# Patient Record
Sex: Female | Born: 1945
Health system: Southern US, Community
[De-identification: ages and names within clinical notes are randomized; demographics above are authoritative.]

## PROBLEM LIST (undated history)

## (undated) DIAGNOSIS — I10 Essential (primary) hypertension: Secondary | ICD-10-CM

## (undated) DIAGNOSIS — E079 Disorder of thyroid, unspecified: Secondary | ICD-10-CM

## (undated) DIAGNOSIS — Z809 Family history of malignant neoplasm, unspecified: Secondary | ICD-10-CM

## (undated) HISTORY — PX: ABDOMINAL HYSTERECTOMY: SHX81

## (undated) HISTORY — DX: Family history of malignant neoplasm, unspecified: Z80.9

## (undated) HISTORY — PX: COLON SURGERY: SHX602

## (undated) HISTORY — PX: TUBAL LIGATION: SHX77

---

## 2001-08-24 ENCOUNTER — Ambulatory Visit (HOSPITAL_COMMUNITY): Admission: RE | Admit: 2001-08-24 | Discharge: 2001-08-24 | Payer: Self-pay | Admitting: Family Medicine

## 2001-08-24 ENCOUNTER — Encounter: Payer: Self-pay | Admitting: Family Medicine

## 2002-09-07 ENCOUNTER — Ambulatory Visit (HOSPITAL_COMMUNITY): Admission: RE | Admit: 2002-09-07 | Discharge: 2002-09-07 | Payer: Self-pay | Admitting: Family Medicine

## 2002-09-07 ENCOUNTER — Encounter: Payer: Self-pay | Admitting: Family Medicine

## 2003-09-29 ENCOUNTER — Ambulatory Visit (HOSPITAL_COMMUNITY): Admission: RE | Admit: 2003-09-29 | Discharge: 2003-09-29 | Payer: Self-pay | Admitting: Family Medicine

## 2003-10-18 ENCOUNTER — Ambulatory Visit (HOSPITAL_COMMUNITY): Admission: RE | Admit: 2003-10-18 | Discharge: 2003-10-18 | Payer: Self-pay | Admitting: Family Medicine

## 2004-01-22 ENCOUNTER — Ambulatory Visit (HOSPITAL_COMMUNITY): Admission: RE | Admit: 2004-01-22 | Discharge: 2004-01-22 | Payer: Self-pay | Admitting: General Surgery

## 2004-09-13 ENCOUNTER — Inpatient Hospital Stay (HOSPITAL_COMMUNITY): Admission: RE | Admit: 2004-09-13 | Discharge: 2004-09-19 | Payer: Self-pay | Admitting: General Surgery

## 2004-11-06 ENCOUNTER — Ambulatory Visit (HOSPITAL_COMMUNITY): Admission: RE | Admit: 2004-11-06 | Discharge: 2004-11-06 | Payer: Self-pay | Admitting: Family Medicine

## 2005-04-09 ENCOUNTER — Encounter (HOSPITAL_COMMUNITY): Admission: RE | Admit: 2005-04-09 | Discharge: 2005-04-09 | Payer: Self-pay | Admitting: Endocrinology

## 2005-04-23 ENCOUNTER — Encounter (HOSPITAL_COMMUNITY): Admission: RE | Admit: 2005-04-23 | Discharge: 2005-05-23 | Payer: Self-pay | Admitting: Endocrinology

## 2005-07-21 ENCOUNTER — Ambulatory Visit: Payer: Self-pay | Admitting: Orthopedic Surgery

## 2005-11-11 ENCOUNTER — Ambulatory Visit (HOSPITAL_COMMUNITY): Admission: RE | Admit: 2005-11-11 | Discharge: 2005-11-11 | Payer: Self-pay | Admitting: Family Medicine

## 2006-12-17 ENCOUNTER — Ambulatory Visit (HOSPITAL_COMMUNITY): Admission: RE | Admit: 2006-12-17 | Discharge: 2006-12-17 | Payer: Self-pay | Admitting: Family Medicine

## 2007-09-14 ENCOUNTER — Ambulatory Visit: Payer: Self-pay | Admitting: Gastroenterology

## 2007-12-22 ENCOUNTER — Ambulatory Visit (HOSPITAL_COMMUNITY): Admission: RE | Admit: 2007-12-22 | Discharge: 2007-12-22 | Payer: Self-pay | Admitting: Family Medicine

## 2008-11-02 ENCOUNTER — Encounter (INDEPENDENT_AMBULATORY_CARE_PROVIDER_SITE_OTHER): Payer: Self-pay | Admitting: *Deleted

## 2008-12-27 ENCOUNTER — Ambulatory Visit (HOSPITAL_COMMUNITY): Admission: RE | Admit: 2008-12-27 | Discharge: 2008-12-27 | Payer: Self-pay | Admitting: Family Medicine

## 2009-01-04 ENCOUNTER — Ambulatory Visit (HOSPITAL_COMMUNITY): Admission: RE | Admit: 2009-01-04 | Discharge: 2009-01-04 | Payer: Self-pay | Admitting: Family Medicine

## 2009-01-10 DIAGNOSIS — I10 Essential (primary) hypertension: Secondary | ICD-10-CM | POA: Insufficient documentation

## 2009-01-10 DIAGNOSIS — R42 Dizziness and giddiness: Secondary | ICD-10-CM | POA: Insufficient documentation

## 2009-01-10 DIAGNOSIS — E039 Hypothyroidism, unspecified: Secondary | ICD-10-CM | POA: Insufficient documentation

## 2009-01-11 ENCOUNTER — Ambulatory Visit: Payer: Self-pay | Admitting: Gastroenterology

## 2009-01-11 DIAGNOSIS — Z860100 Personal history of colon polyps, unspecified: Secondary | ICD-10-CM | POA: Insufficient documentation

## 2009-01-12 ENCOUNTER — Encounter: Payer: Self-pay | Admitting: Gastroenterology

## 2009-02-02 ENCOUNTER — Ambulatory Visit: Payer: Self-pay | Admitting: Gastroenterology

## 2009-02-02 ENCOUNTER — Ambulatory Visit (HOSPITAL_COMMUNITY): Admission: RE | Admit: 2009-02-02 | Discharge: 2009-02-02 | Payer: Self-pay | Admitting: Gastroenterology

## 2009-02-02 ENCOUNTER — Encounter: Payer: Self-pay | Admitting: Gastroenterology

## 2010-01-15 ENCOUNTER — Ambulatory Visit (HOSPITAL_COMMUNITY): Admission: RE | Admit: 2010-01-15 | Discharge: 2010-01-15 | Payer: Self-pay | Admitting: Family Medicine

## 2010-07-07 ENCOUNTER — Encounter: Payer: Self-pay | Admitting: Family Medicine

## 2010-10-28 IMAGING — MG MM DIGITAL SCREENING
4 series · 4 of 4 positions shown · non-contrast
Comparison: Prior studies.

DG SCREEN MAMMOGRAM BILATERAL
Bilateral CC and MLO view(s) were taken.

DIGITAL SCREENING MAMMOGRAM WITH CAD:

[L CC]
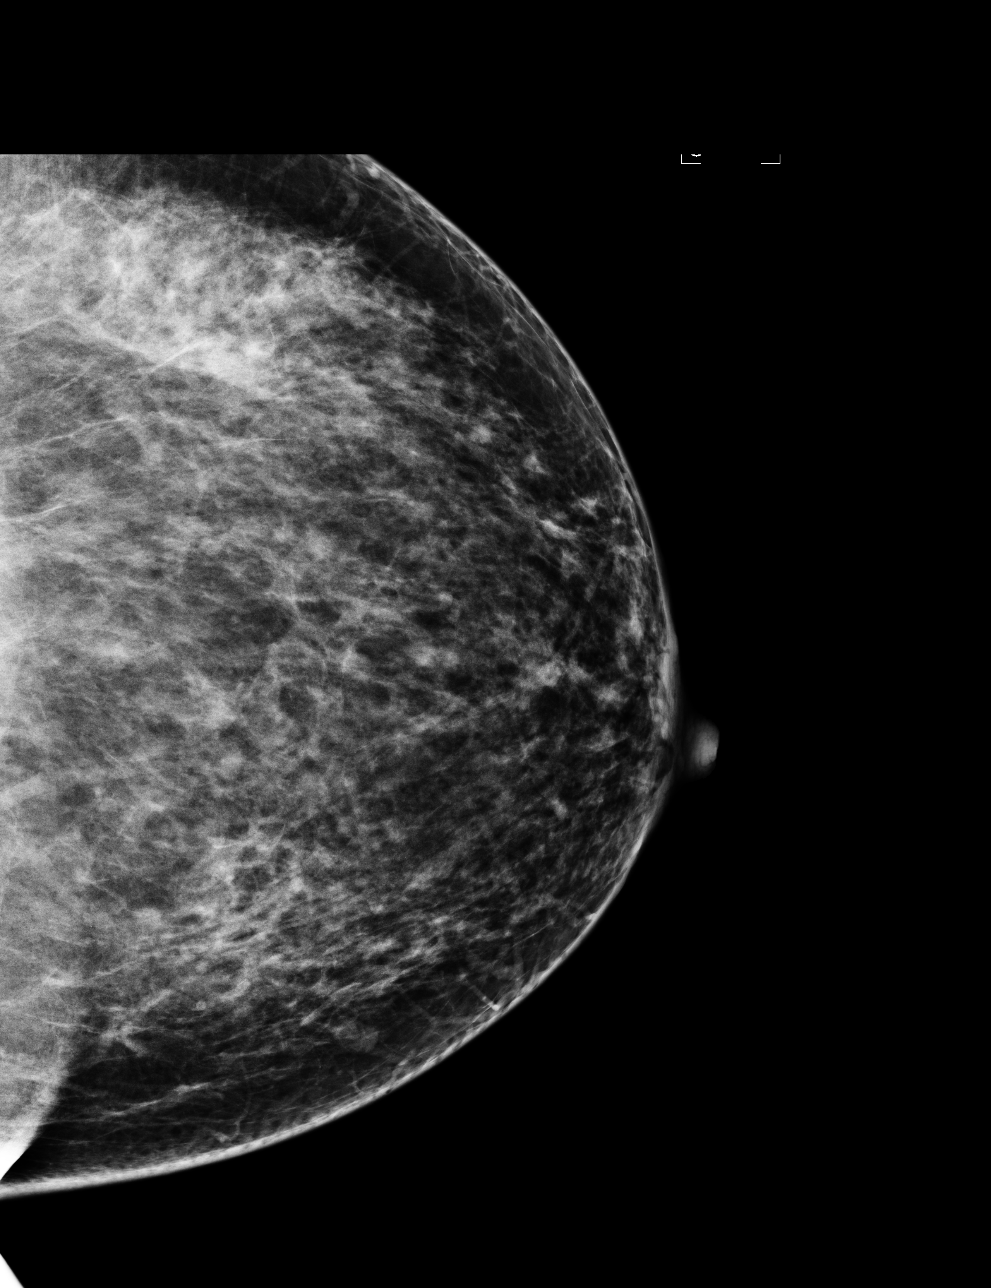

[L MLO]
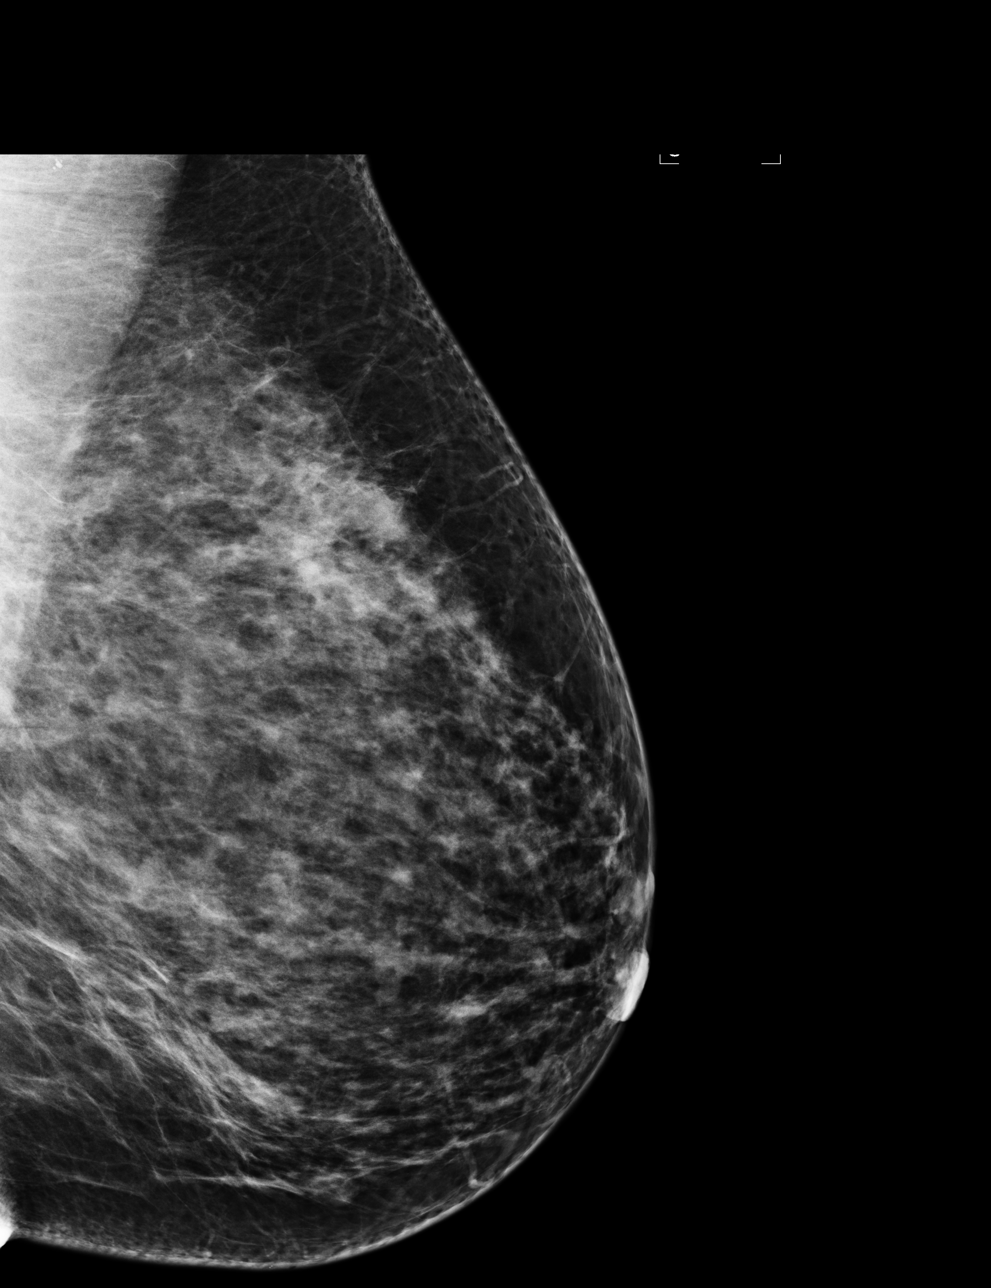

[R CC]
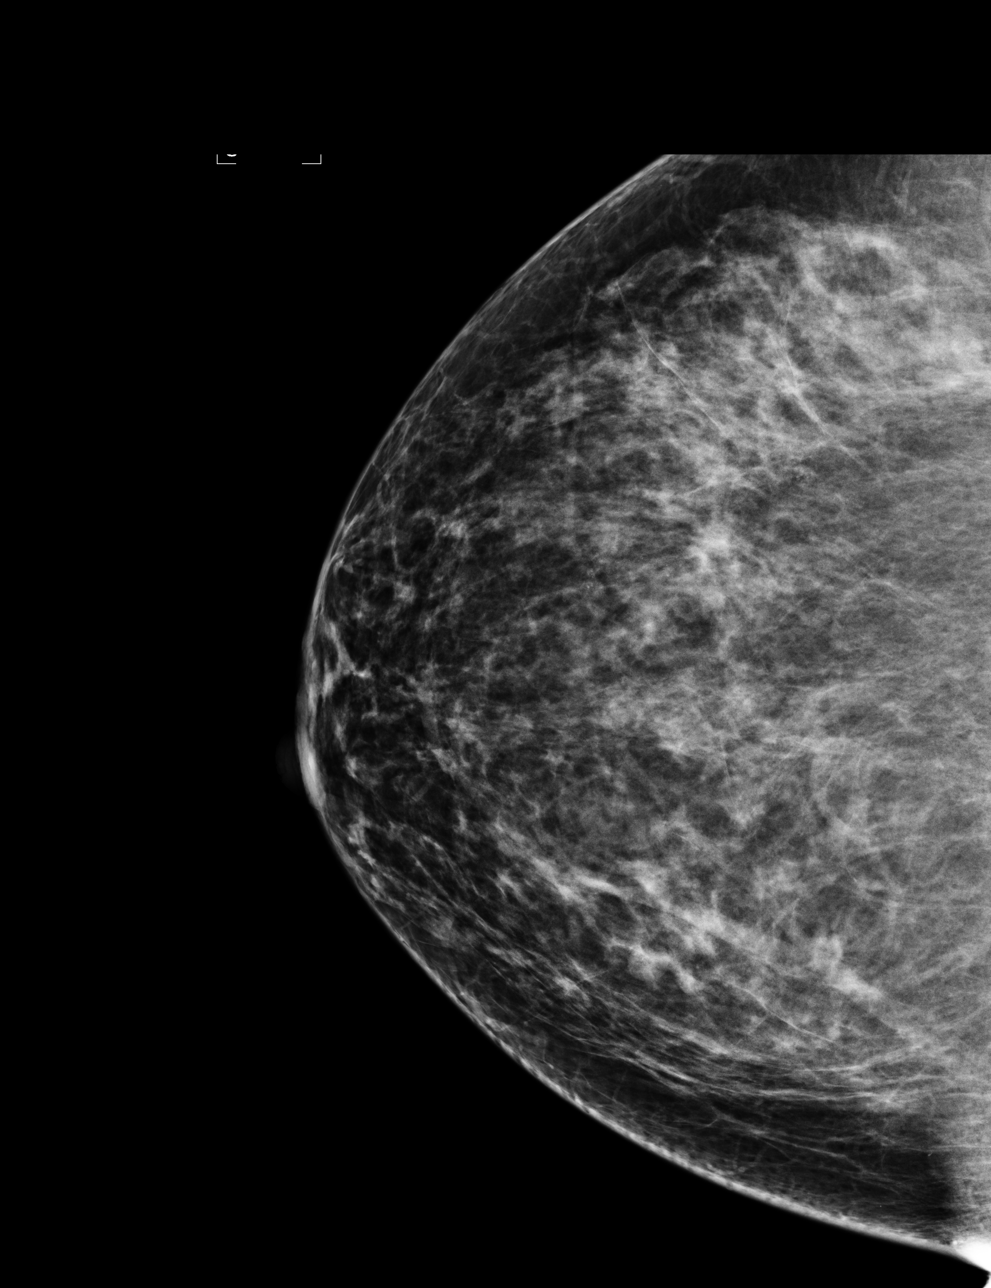

[R MLO]
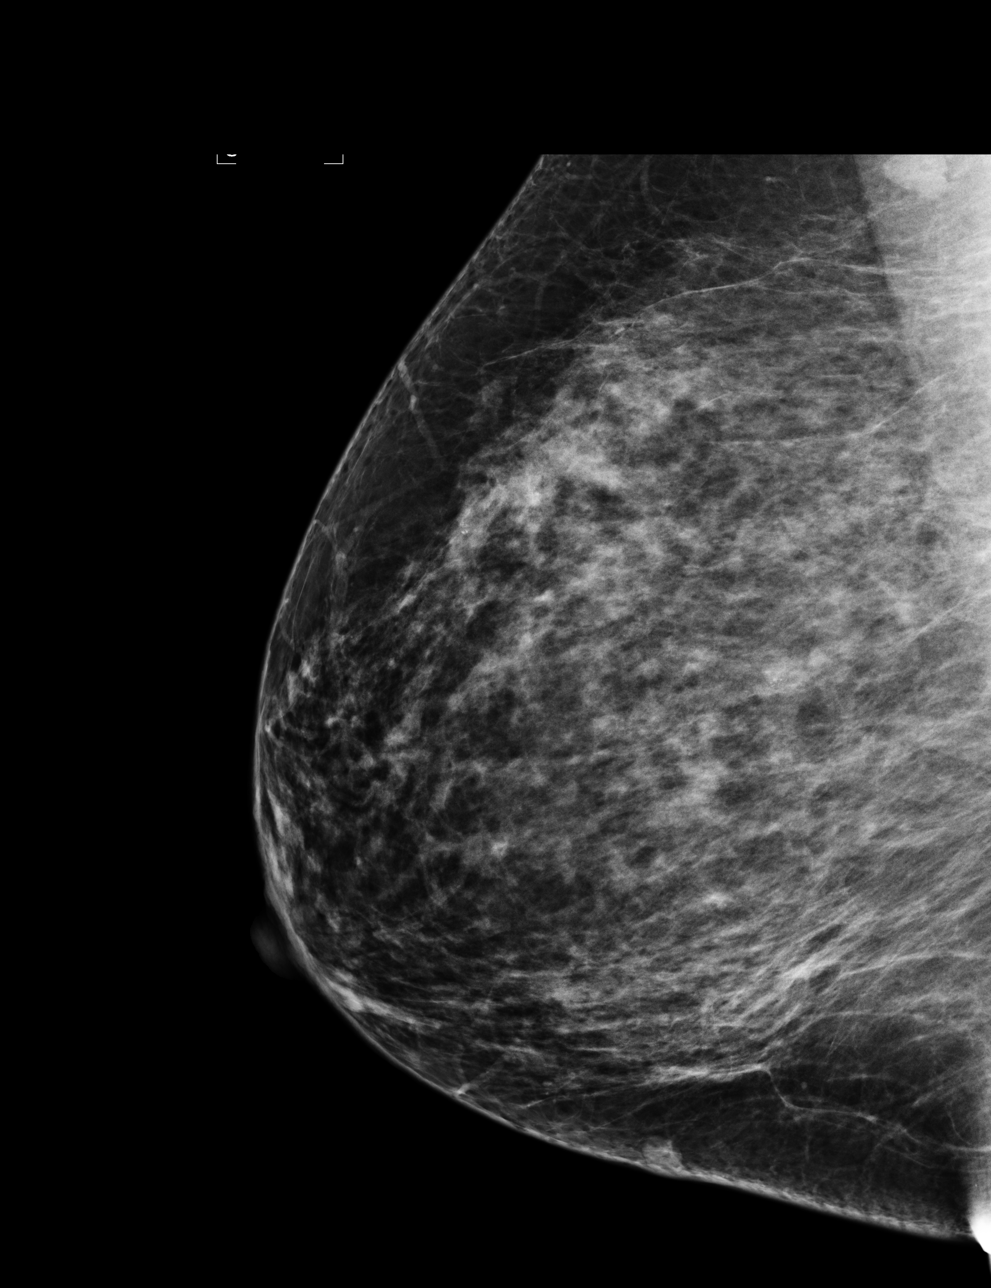

[4 of 4 positions shown; findings below may reference images not displayed]

The breast tissue is heterogeneously dense.  A possible mass is noted in the right breast.  Spot 
compression views and possibly sonography are recommended for further evaluation.  The left breast 
is unremarkable.

Images were processed with CAD.
IMPRESSION: Possible mass, right breast.  Additional evaluation is indicated.  The patient will be contacted 
for additional studies and a supplemental report will follow.

ASSESSMENT: Need additional imaging evaluation and/or prior mammograms for comparison - BI-RADS 0 -
Right

Further imaging of the right breast.
, THIS PROCEDURE WAS A DIGITAL MAMMOGRAM.

## 2010-10-29 NOTE — Op Note (Signed)
NAME:  Theresa Buchanan, Theresa Buchanan NO.:  [id number]   MEDICAL RECORD NO.:  [id number]          PATIENT TYPE:  AMB   LOCATION:  DAY                           FACILITY:  [facility]   PHYSICIAN:  Kassie Mends, M.D.      DATE OF BIRTH:  Jul 06, 1945   DATE OF PROCEDURE:  02/02/2009  DATE OF DISCHARGE:                               OPERATIVE REPORT   REFERRING PHYSICIAN:  Melony Overly, PA   PROCEDURE:  Colonoscopy with cold forceps polypectomy.   INDICATION FOR EXAM:  Theresa Buchanan is a 65 year old female who had a right  hemicolectomy due to a large adenomatous polyp with advanced features.  The surgery was performed in March 2006.  She presents for continued  surveillance.   FINDINGS:  1. Normal anastomosis.  A 6-mm sessile transverse colon polyp removed      via cold forceps.  A 1-mm sessile transverse colon polyp removed      via cold forceps.  A 2-mm sessile sigmoid colon polyp removed via      cold forceps.  2. Rare sigmoid colon diverticula.  Otherwise, no masses, inflammatory      changes, or AVMs seen.  3. Moderate internal hemorrhoids.  Otherwise, normal retroflexed view      of the rectum.   RECOMMENDATIONS:  1. Screening colonoscopy in 5 years with an ENTRADA overtube.  2. Will call her with the results of her biopsies.  She may restart      her aspirin on February 14, 2009.  3. She should follow a high-fiber diet.  She was given a handout on      high-fiber diet, polyps,  diverticulosis, and hemorrhoids.   MEDICATIONS:  1. Demerol 75 mg IV.  2. Versed 4 mg IV.   PROCEDURE TECHNIQUE:  Physical exam was performed and informed consent  was obtained from the patient after explaining the benefits, risks, and  alternatives to the procedure.  The patient was connected to the monitor  and placed in the left lateral position.  Continuous oxygen was provided  by nasal cannula and IV medicine administered through an indwelling  cannula.  After administration of sedation and  rectal exam, the  patient's rectum was intubated and the scope was advanced under direct  visualization to the anastomosis.  The scope was removed slowly by  carefully examining the color, texture, anatomy, and integrity of the  mucosa on the way out.  The patient was recovered in endoscopy and  discharged home in satisfactory condition.   PATH:  SIMPLE ADENOMAS. TCS IN 5 YEARS. HIGH FIBER DIET. FIRST DEGREE  RELATIVES NEEDS TSC AT AGE 52 ANF THEN EVERY 5 YEARS.      Kassie Mends, M.D.  Electronically Signed     SM/MEDQ  D:  02/02/2009  T:  02/02/2009  Job:  [id number]   cc:   Melony Overly, PA   [name], M.D.  Fax: [phone]

## 2010-10-29 NOTE — Assessment & Plan Note (Signed)
NAME:  Theresa Buchanan, MONTECALVO                CHART#:  [id number]   DATE:  09/14/2007                       DOB:  1945-10-11   REASON FOR CONSULTATION:  History of colon polyps status post partial  colectomy; when does next colonoscopy need to be?   PHYSICIAN REQUESTING CONSULTATION:  [name], M.D.   HISTORY OF PRESENT ILLNESS:  The patient is a 65 year old African  American who presents today at the request of Melony Overly, [facility] with Dr.  [name] for further recommendation regarding timing of her next  colonoscopy. The patient has a history of colonic polyps which were  reportedly large and could not be removed endoscopically. She therefore  underwent a partial colectomy. This was around 2006. She states that the  polyps did not have any cancer within them. Her initial colonoscopy was  by Dr. Lovell Sheehan. Her partial colectomy was by Dr. Elpidio Anis. Her mother  has a history of colon polyps as well. No family history of colorectal  cancer. None of her children have had colonoscopies. Her sisters have  all had colonoscopies without polyps. She denies any constipation,  melena, rectal bleeding, diarrhea, abdominal pain. She does have  occasional post-prandial stools related to certain foods. This usually  happens after breakfast. She denies any heartburn, nausea, vomiting,  dysphagia, odynophagia, or weight loss.   CURRENT MEDICATIONS:  1. Synthroid 75 mcg daily.  2. Enalapril 20 mg daily.  3. HCTZ 25 mg daily.  4. Aspirin 81 mg daily.  5. Meclizine p.r.n.   ALLERGIES:  No known drug allergies.   PAST MEDICAL HISTORY:  1. Hypertension.  2. Hypothyroidism.  3. She has been having some vertigo-like symptoms.   PAST SURGICAL HISTORY:  1. Partial colectomy as outlined above for polyps.  2. Hysterectomy.  3. Tubal ligation.   FAMILY HISTORY:  As above. In addition, she had an aunt with breast  cancer and an aunt with pancreatic cancer. Her brother died due to an  aneurysm.   SOCIAL HISTORY:  She is married. She has 63 year old, 63 year old, and  78 year old children. She is employed with [employer] and never been a  smoker. No alcohol use.   REVIEW OF SYSTEMS:  See HPI for GI and constitutional. Cardiopulmonary:  Denies chest pain and shortness of breath and cough. Genitourinary:  Denies dysuria or hematuria.   PHYSICAL EXAMINATION:  VITAL SIGNS:  Weight 179. Height 5 feet 6 inches.  Temperature 98.2. Blood pressure 118/70. Pulse 64.  GENERAL:  Pleasant, well-developed, well-nourished, obese black female  in no acute distress. SKIN:  Warm and dry. No jaundice.HEENT:  Sclerae  anicteric. Oropharyngeal mucosa moist and pink. No lesions, erythema, or  exudate. No lymphadenopathy or thyromegaly. CHEST:  Lungs are clear to  auscultation.  CARDIAC:  Regular rate and rhythm. Normal S1, S2. No murmurs, gallops,  or rubs.ABDOMEN:  Positive bowel sounds. Abdomen is soft, nontender,  nondistended. No organomegaly or masses. No rebound or guarding. No  abdominal bruits or hernias.LOWER EXTREMITIES:  No edema.   IMPRESSION:  Theresa Buchanan is a 65 year old lady with history of partial  colectomy due to large colon polyps approximately 3 years ago. Based on  what she is telling me, she will likely need to have a colonoscopy in  the near future. Would like to obtain copies of her colonoscopy and op  notes as well as pathology to make more definitive recommendations  regarding herself and her family.   PLAN:  1. Retrieve medical records as above.  2. Further recommendations to follow.   I would like to thank Melony Overly, [facility] and Dr. [name] for  allowing [place] to take part in the care of this patient.   ADDENDUM:  TCS in 2011 since polyp had no evidence of high grade  dysplasia per Dr. Cira Servant.       Theresa Buchanan, P.A.  Electronically Signed     Theresa Buchanan, M.D.  Electronically Signed   LL/MEDQ  D:  09/14/2007  T:  09/14/2007  Job:  [id number]   cc:   Melony Overly, PA  [name], M.D.

## 2010-11-01 NOTE — Letter (Signed)
Oct 20, 2007    Theresa Buchanan   Dear Ms. Moravek,   I am writing this letter to provide you with further recommendations  regarding when you need your next colonoscopy.  As you recall, you were  seen in the office on 09/14/07 for consultation regarding a history of  colon polyps and to determine when you needed your next colonoscopy.  We  received records from your prior surgeries.  As you may recall, you  underwent removal of a tumor from your right colon on 09/13/04 by Dr.  Elpidio Anis.  The pathology from that surgery revealed no malignancy.  You had a tubular adenoma, which is a benign lesion that could lead to  cancer.  Given that there was no high-grade dysplasia or malignancy  present, Dr. Cira Servant has recommended that your follow-up colonoscopy be in  five years from your prior colonoscopy in August, 2005; therefore, you  need a colonoscopy in August, 2010.   If you have any questions, please feel free to contact me at [phone].  Please note I am sending a copy of this letter to your family  physician for their records as well.     Tana Coast, P.A.  Electronically Signed     Kassie Mends, M.D.  Electronically Signed    LL/MEDQ  D:  10/20/2007  T:  10/20/2007  Job:  [id number]   cc:   [name], M.D.

## 2010-11-01 NOTE — H&P (Signed)
NAME:  Theresa Buchanan, Bloodgood               ACCOUNT NO.:  [id number]   MEDICAL RECORD NO.:  [id number]          PATIENT TYPE:  AMB   LOCATION:  DAY                           FACILITY:  [facility]   PHYSICIAN:  Leroy C. Katrinka Blazing, M.D.   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  The patient was scheduled for repeat colonoscopy and possible  right colectomy in September 2005 but she became afraid and did not show up  for the procedure.  She denies having any rectal bleeding since then.  She  does have mild right-sided abdominal pain.  She has not had nausea or  vomiting.  Review of the pathology from August 2005 shows adenomatous polyp  with nicks that were tubular architecture.  The patient is scheduled for  right colectomy.   PAST MEDICAL HISTORY:  1.  She has hypertension.  2.  Gastroesophageal reflux disease.  3.  Depression.  4.  Osteoarthritis.   ALLERGIES:  No known drug allergies.   MEDICATIONS:  1.  Enalapril HCT 20/12.5 daily.  2.  Protonix 40 mg daily.   PAST SURGICAL HISTORY:  1.  Total abdominal hysterectomy.  2.  Tubal ligation.   FAMILY HISTORY:  Positive for hypertension, arteriosclerotic heart disease,  diabetes mellitus, stroke, and DVT.   PHYSICAL EXAMINATION:  VITAL SIGNS:  Blood pressure 160/90, pulse 76,  respirations 20, weight 169 pounds.  HEENT:  Unremarkable.  NECK:  Supple.  No JVD, bruits, adenopathy, or thyromegaly.  CHEST:  Clear to auscultation.  HEART:  Regular rate and rhythm without murmur, gallop or rub.  ABDOMEN:  Soft, nontender.  No masses.  EXTREMITIES:  No cyanosis, clubbing or edema.  NEUROLOGIC:  No focal motor, sensory or cerebellar deficits.   IMPRESSION:  1.  History of villous tumor of the right colon with  delayed treatment due      to patient noncompliance.  2.  History of transverse colon polyps.  3.  Hypertension.  4.  Gastroesophageal reflux disease.  5.  Osteoarthritis.   PLAN:  The patient will have bowel prep at home and will undergo right  colectomy.      LCS/MEDQ  D:  09/12/2004  T:  09/13/2004  Job:  [id number]   cc:   [name], M.D.  [address]  [place]  [place] [address]  Fax: [phone]

## 2010-11-01 NOTE — Discharge Summary (Signed)
NAME:  Theresa Buchanan, Theresa Buchanan               ACCOUNT NO.:  [id number]   MEDICAL RECORD NO.:  [id number]          PATIENT TYPE:  INP   LOCATION:  A331                          FACILITY:  [facility]   PHYSICIAN:  Jerolyn Shin C. Katrinka Blazing, M.D.   DATE OF BIRTH:  02-06-1946   DATE OF ADMISSION:  09/13/2004  DATE OF DISCHARGE:  04/06/2006LH                                 DISCHARGE SUMMARY   DISCHARGE DIAGNOSES:  1.  Tubular adenoma of cecum.  2.  Hypertension.  3.  Gastroesophageal reflux disease.  4.  Osteoarthritis.   SPECIAL PROCEDURE:  Right colectomy.   DISPOSITION:  Patient discharged home in stable satisfactory condition.   DISCHARGE MEDICATIONS:  1.  Tylox one or two every 4 hours as needed for pain.  2.  Enalapril/hydrochlorothiazide 20/12.5 daily.  3.  Reglan 10 mg before every meal and at bedtime.   FOLLOWUP:  Patient is scheduled to be seen in the office in 2 weeks.   SUMMARY:  [age]-year-old female with history of a large villous tumor of  the right colon which was initially noted in August 2005.  The patient was  scheduled for repeat colonoscopy and right colectomy in September but she  became afraid and did not show up for the procedure.  She denied having any  rectal bleeding in the interim.  She did have some mild right-sided  abdominal pain.  She has not had nausea or vomiting.  Pathology from the  biopsy in August 2005 showed an adenomatous polyp with tubular architecture.  The patient was scheduled for right colectomy.  She had no significant major  illness.  Physical exam was unremarkable.  The patient was admitted through  day surgery and underwent the right colectomy.  She remained stable in the  postoperative course except for transient hypoxemia due to atelectasis.  As  her activity increased, her hypoxemia improved.  By the fifth postoperative  day she was having regular bowel movements.  Her diet was advanced and she  was discharged on the sixth postoperative day in  satisfactory condition.      LCS/MEDQ  D:  10/26/2004  T:  10/27/2004  Job:  [id number]

## 2010-11-01 NOTE — Op Note (Signed)
NAME:  Theresa Buchanan, Theresa Buchanan               ACCOUNT NO.:  [id number]   MEDICAL RECORD NO.:  [id number]          PATIENT TYPE:  AMB   LOCATION:  DAY                           FACILITY:  [facility]   PHYSICIAN:  Leroy C. Katrinka Blazing, M.D.   DATE OF BIRTH:  03-20-1946   DATE OF PROCEDURE:  09/13/2004  DATE OF DISCHARGE:                                 OPERATIVE REPORT   PREOPERATIVE DIAGNOSIS:  Right colon mass with villous tumor.   POSTOPERATIVE DIAGNOSIS:  Villous tumor, right colon.   PROCEDURE:  Right colectomy.   SURGEON:  Elpidio Anis, M.D.   DESCRIPTION:  Under general anesthesia, the patient's abdomen was prepped  and draped in a sterile field.  A midline incision was made. Upon entering  the abdomen, it was noted that there was a small soft mass in the cecum  opposite the ileocecal valve.  Initially this was felt to be polypoid in  nature, so a small colotomy was made.  Inspection of this mass revealed it  to be broad-based with some infiltration of the mucosa with a small area of  central ulceration.  It was therefore felt that this needed to be excised  rather than a local resection.  The colotomy was closed with the TA-30  stapler.  The right colon was mobilized up to the level of the ascending  colon.  The patient had a very long appendix, which was retrocecal and  extended up to the subhepatic space.  This was fully mobilized.  The  terminal ileum was mobilized without difficulty.  The terminal ileum was  transected using a GIA-60 stapler.  The colon was divided near the hepatic  flexure with a GIA-60 stapler.  The mesentery was then serially dissected,  clamped with Kelly clamps and divided.  Vessels were tied with double  ligatures of 2-0 silk.  The specimen was passed off.  A side-to-side  anastomosis was achieved.  The side of the terminal ileum was sutured to the  colonic segment along the antimesenteric borders.  This was done using GIA-  60 and a TA-60 stapler.  The anastomosis was  felt to be widely patent.  The  mesenteric defect was closed with 3-0 Vicryl.  Irrigation was carried out.  The patient tolerated procedure well.  Sponge, needle, instrument, and blade  counts were verified as correct.  Inspection of the liver, pancreas,  gallbladder, stomach was normal.  Small bowel appeared to be normal.  The  remainder of the colon appeared to be normal.  She had two normal-appearing  ovaries.  The abdomen was closed after verifying the sponge, needle,  instrument and blade counts were correct.  Fascia was closed with #1  Prolene.  Subcutaneous tissue was closed with 2-0 and 3-0 Vicryl.  Skin was closed  with staples.  Dressing was placed.  The patient tolerated procedure well.  She was awakened from anesthesia uneventfully, transferred to a bed and  taken to the postanesthetic care unit for further monitoring. copy to Dr.  Thayer Ohm in place      LCS/MEDQ  D:  09/13/2004  T:  09/13/2004  Job:  [id number]   cc:   [name], M.D.  [address], [address]  [place]  [place] [address]  Fax: [phone]

## 2011-02-03 ENCOUNTER — Other Ambulatory Visit (HOSPITAL_BASED_OUTPATIENT_CLINIC_OR_DEPARTMENT_OTHER): Payer: Self-pay | Admitting: Family Medicine

## 2011-02-03 DIAGNOSIS — Z139 Encounter for screening, unspecified: Secondary | ICD-10-CM

## 2011-02-06 ENCOUNTER — Ambulatory Visit (HOSPITAL_COMMUNITY)
Admission: RE | Admit: 2011-02-06 | Discharge: 2011-02-06 | Disposition: A | Discharge status: Home / Self Care | Payer: BC Managed Care – PPO | Source: Ambulatory Visit | Attending: Family Medicine | Admitting: Family Medicine

## 2011-02-06 DIAGNOSIS — Z1231 Encounter for screening mammogram for malignant neoplasm of breast: Secondary | ICD-10-CM | POA: Insufficient documentation

## 2011-02-06 DIAGNOSIS — Z139 Encounter for screening, unspecified: Secondary | ICD-10-CM

## 2011-02-13 ENCOUNTER — Other Ambulatory Visit: Payer: Self-pay | Admitting: Family Medicine

## 2011-02-13 DIAGNOSIS — R928 Other abnormal and inconclusive findings on diagnostic imaging of breast: Secondary | ICD-10-CM

## 2011-03-12 ENCOUNTER — Ambulatory Visit (HOSPITAL_COMMUNITY)
Admission: RE | Admit: 2011-03-12 | Discharge: 2011-03-12 | Disposition: A | Discharge status: Home / Self Care | Payer: BC Managed Care – PPO | Source: Ambulatory Visit | Attending: Family Medicine | Admitting: Family Medicine

## 2011-03-12 ENCOUNTER — Other Ambulatory Visit (HOSPITAL_COMMUNITY): Payer: Self-pay | Admitting: Family Medicine

## 2011-03-12 DIAGNOSIS — R928 Other abnormal and inconclusive findings on diagnostic imaging of breast: Secondary | ICD-10-CM

## 2012-02-04 ENCOUNTER — Ambulatory Visit (HOSPITAL_COMMUNITY)
Admission: RE | Admit: 2012-02-04 | Discharge: 2012-02-04 | Disposition: A | Discharge status: Home / Self Care | Payer: BC Managed Care – PPO | Source: Ambulatory Visit | Attending: Family Medicine | Admitting: Family Medicine

## 2012-02-04 ENCOUNTER — Other Ambulatory Visit (HOSPITAL_COMMUNITY): Payer: Self-pay | Admitting: Family Medicine

## 2012-02-04 DIAGNOSIS — Z01419 Encounter for gynecological examination (general) (routine) without abnormal findings: Secondary | ICD-10-CM

## 2012-02-04 DIAGNOSIS — M542 Cervicalgia: Secondary | ICD-10-CM | POA: Insufficient documentation

## 2012-02-04 DIAGNOSIS — Z139 Encounter for screening, unspecified: Secondary | ICD-10-CM

## 2012-02-04 DIAGNOSIS — M130 Polyarthritis, unspecified: Secondary | ICD-10-CM

## 2012-02-04 DIAGNOSIS — M25519 Pain in unspecified shoulder: Secondary | ICD-10-CM | POA: Insufficient documentation

## 2012-03-12 ENCOUNTER — Ambulatory Visit (HOSPITAL_COMMUNITY)
Admission: RE | Admit: 2012-03-12 | Discharge: 2012-03-12 | Disposition: A | Discharge status: Home / Self Care | Payer: BC Managed Care – PPO | Source: Ambulatory Visit | Attending: Family Medicine | Admitting: Family Medicine

## 2012-03-12 DIAGNOSIS — Z1231 Encounter for screening mammogram for malignant neoplasm of breast: Secondary | ICD-10-CM | POA: Insufficient documentation

## 2012-03-12 DIAGNOSIS — Z01419 Encounter for gynecological examination (general) (routine) without abnormal findings: Secondary | ICD-10-CM

## 2012-03-12 DIAGNOSIS — Z139 Encounter for screening, unspecified: Secondary | ICD-10-CM

## 2012-03-12 DIAGNOSIS — M899 Disorder of bone, unspecified: Secondary | ICD-10-CM | POA: Insufficient documentation

## 2013-02-21 ENCOUNTER — Other Ambulatory Visit (HOSPITAL_COMMUNITY): Payer: Self-pay | Admitting: Family Medicine

## 2013-02-21 DIAGNOSIS — Z139 Encounter for screening, unspecified: Secondary | ICD-10-CM

## 2013-03-14 ENCOUNTER — Ambulatory Visit (HOSPITAL_COMMUNITY)
Admission: RE | Admit: 2013-03-14 | Discharge: 2013-03-14 | Disposition: A | Discharge status: Home / Self Care | Payer: Medicare Other | Source: Ambulatory Visit | Attending: Family Medicine | Admitting: Family Medicine

## 2013-03-14 DIAGNOSIS — Z139 Encounter for screening, unspecified: Secondary | ICD-10-CM

## 2013-03-14 DIAGNOSIS — Z1231 Encounter for screening mammogram for malignant neoplasm of breast: Secondary | ICD-10-CM | POA: Insufficient documentation

## 2014-01-20 ENCOUNTER — Encounter: Payer: Self-pay | Admitting: Gastroenterology

## 2014-02-07 ENCOUNTER — Other Ambulatory Visit (HOSPITAL_COMMUNITY): Payer: Self-pay | Admitting: Family Medicine

## 2014-02-07 DIAGNOSIS — Z1231 Encounter for screening mammogram for malignant neoplasm of breast: Secondary | ICD-10-CM

## 2014-03-15 ENCOUNTER — Ambulatory Visit (HOSPITAL_COMMUNITY): Payer: BC Managed Care – PPO

## 2014-03-15 ENCOUNTER — Ambulatory Visit (HOSPITAL_COMMUNITY)
Admission: RE | Admit: 2014-03-15 | Discharge: 2014-03-15 | Disposition: A | Discharge status: Home / Self Care | Payer: Medicare Other | Source: Ambulatory Visit | Attending: Family Medicine | Admitting: Family Medicine

## 2014-03-15 DIAGNOSIS — Z1231 Encounter for screening mammogram for malignant neoplasm of breast: Secondary | ICD-10-CM | POA: Insufficient documentation

## 2014-06-14 ENCOUNTER — Other Ambulatory Visit (HOSPITAL_COMMUNITY): Payer: Self-pay | Admitting: Family Medicine

## 2014-06-14 ENCOUNTER — Ambulatory Visit (HOSPITAL_COMMUNITY)
Admission: RE | Admit: 2014-06-14 | Discharge: 2014-06-14 | Disposition: A | Discharge status: Home / Self Care | Payer: Medicare Other | Source: Ambulatory Visit | Attending: Family Medicine | Admitting: Family Medicine

## 2014-06-14 DIAGNOSIS — M542 Cervicalgia: Secondary | ICD-10-CM | POA: Diagnosis present

## 2014-06-14 DIAGNOSIS — M5032 Other cervical disc degeneration, mid-cervical region: Secondary | ICD-10-CM | POA: Diagnosis not present

## 2014-11-23 DIAGNOSIS — Z0001 Encounter for general adult medical examination with abnormal findings: Secondary | ICD-10-CM | POA: Diagnosis not present

## 2014-11-23 DIAGNOSIS — Z86010 Personal history of colonic polyps: Secondary | ICD-10-CM | POA: Diagnosis not present

## 2014-11-23 DIAGNOSIS — Z6830 Body mass index (BMI) 30.0-30.9, adult: Secondary | ICD-10-CM | POA: Diagnosis not present

## 2014-11-23 DIAGNOSIS — E782 Mixed hyperlipidemia: Secondary | ICD-10-CM | POA: Diagnosis not present

## 2014-11-23 DIAGNOSIS — E6609 Other obesity due to excess calories: Secondary | ICD-10-CM | POA: Diagnosis not present

## 2014-11-23 DIAGNOSIS — I10 Essential (primary) hypertension: Secondary | ICD-10-CM | POA: Diagnosis not present

## 2014-12-01 ENCOUNTER — Telehealth: Payer: Self-pay | Admitting: Gastroenterology

## 2014-12-01 NOTE — Telephone Encounter (Signed)
TRIAGE PT FOR Screening colonoscopy in 5 years with an ENTRADA overtube, Dx: PMHx: ADVANCED ADENOMA 2006, SIMPLE ADENOMAS(2010) SUPREP-FULL LIQUIDS WITH BREAKFAST.   Full Liquid Diet A high-calorie, high-protein supplement should be used to meet your nutritional requirements when the full liquid diet is continued for more than 2 or 3 days. If this diet is to be used for an extended period of time (more than 7 days), a multivitamin should be considered.  Breads and Starches  Allowed: None are allowed except crackers WHOLE OR pureed (made into a thick, smooth soup) in soup.   Avoid: Any others.    Potatoes/Pasta/Rice  Allowed: ANY ITEM AS A SOUP OR SMALL PLATE OF MASHED POTATOES.       Vegetables  Allowed: Strained tomato or vegetable juice. Vegetables pureed in soup.   Avoid: Any others.    Fruit  Allowed: Any strained fruit juices and fruit drinks. Include 1 serving of citrus or vitamin C-enriched fruit juice daily.   Avoid: Any others.  Meat and Meat Substitutes  Allowed: Egg  Avoid: Any meat, fish, or fowl. All cheese.  Milk  Allowed: Milk beverages, including milk shakes and instant breakfast mixes. Smooth yogurt.   Avoid: Any others. Avoid dairy products if not tolerated.    Soups and Combination Foods  Allowed: Broth, strained cream soups. Strained, broth-based soups.   Avoid: Any others.    Desserts and Sweets  Allowed: flavored gelatin,plain ice cream, sherbet, smooth pudding, junket, fruit ices, frozen ice pops, pudding pops,, frozen fudge pops, chocolate syrup. Sugar, honey, jelly, syrup.   Avoid: Any others.  Fats and Oils  Allowed: Margarine, butter, cream, sour cream, oils.   Avoid: Any others.  Beverages  Allowed: All.   Avoid: None.  Condiments  Allowed: Iodized salt, pepper, spices, flavorings. Cocoa powder.   Avoid: Any others.    SAMPLE MEAL PLAN Breakfast   cup orange juice.   1 OR 2 EGGS   1 cup  milk.   1 cup  beverage (coffee or tea).   Cream or sugar, if desired.    Midmorning Snack  2 SCRAMBLED OR HARD BOILED EGG   Lunch  1 cup cream soup.    cup fruit juice.   1 cup milk.    cup custard.   1 cup beverage (coffee or tea).   Cream or sugar, if desired.    Midafternoon Snack  1 cup milk shake.  Dinner  1 cup cream soup.    cup fruit juice.   1 cup milk.    cup pudding.   1 cup beverage (coffee or tea).   Cream or sugar, if desired.  Evening Snack  1 cup supplement.  To increase calories, add sugar, cream, butter, or margarine if possible. Nutritional supplements will also increase the total calories.

## 2014-12-04 NOTE — Telephone Encounter (Signed)
Noted. Pt on recall list for  5 year

## 2014-12-12 DIAGNOSIS — M2560 Stiffness of unspecified joint, not elsewhere classified: Secondary | ICD-10-CM | POA: Diagnosis not present

## 2014-12-12 DIAGNOSIS — M542 Cervicalgia: Secondary | ICD-10-CM | POA: Diagnosis not present

## 2014-12-12 DIAGNOSIS — M62412 Contracture of muscle, left shoulder: Secondary | ICD-10-CM | POA: Diagnosis not present

## 2014-12-12 DIAGNOSIS — R293 Abnormal posture: Secondary | ICD-10-CM | POA: Diagnosis not present

## 2014-12-21 NOTE — Telephone Encounter (Signed)
PT NEEDS TRIAGE FOR TCS IN 2016 & THEN EVERY 5 YEARS.

## 2014-12-29 NOTE — Telephone Encounter (Signed)
LMOM to call.

## 2015-01-03 NOTE — Telephone Encounter (Signed)
Letter mailed to pt to call and schedule colonoscopy.

## 2015-02-27 ENCOUNTER — Other Ambulatory Visit (HOSPITAL_COMMUNITY): Payer: Self-pay | Admitting: Family Medicine

## 2015-02-27 DIAGNOSIS — Z1231 Encounter for screening mammogram for malignant neoplasm of breast: Secondary | ICD-10-CM

## 2015-03-22 ENCOUNTER — Ambulatory Visit (HOSPITAL_COMMUNITY)
Admission: RE | Admit: 2015-03-22 | Discharge: 2015-03-22 | Disposition: A | Discharge status: Home / Self Care | Payer: Medicare Other | Source: Ambulatory Visit | Attending: Family Medicine | Admitting: Family Medicine

## 2015-03-22 DIAGNOSIS — Z1231 Encounter for screening mammogram for malignant neoplasm of breast: Secondary | ICD-10-CM

## 2015-03-23 ENCOUNTER — Other Ambulatory Visit: Payer: Self-pay | Admitting: Family Medicine

## 2015-03-23 DIAGNOSIS — R928 Other abnormal and inconclusive findings on diagnostic imaging of breast: Secondary | ICD-10-CM

## 2015-04-03 ENCOUNTER — Ambulatory Visit (HOSPITAL_COMMUNITY)
Admission: RE | Admit: 2015-04-03 | Discharge: 2015-04-03 | Disposition: A | Discharge status: Home / Self Care | Payer: Medicare Other | Source: Ambulatory Visit | Attending: Family Medicine | Admitting: Family Medicine

## 2015-04-03 DIAGNOSIS — R928 Other abnormal and inconclusive findings on diagnostic imaging of breast: Secondary | ICD-10-CM | POA: Diagnosis not present

## 2015-10-17 DIAGNOSIS — H52223 Regular astigmatism, bilateral: Secondary | ICD-10-CM | POA: Diagnosis not present

## 2015-10-17 DIAGNOSIS — H5203 Hypermetropia, bilateral: Secondary | ICD-10-CM | POA: Diagnosis not present

## 2015-10-17 DIAGNOSIS — H524 Presbyopia: Secondary | ICD-10-CM | POA: Diagnosis not present

## 2015-10-17 DIAGNOSIS — H25013 Cortical age-related cataract, bilateral: Secondary | ICD-10-CM | POA: Diagnosis not present

## 2015-11-15 DIAGNOSIS — H8110 Benign paroxysmal vertigo, unspecified ear: Secondary | ICD-10-CM | POA: Diagnosis not present

## 2015-11-15 DIAGNOSIS — Z1389 Encounter for screening for other disorder: Secondary | ICD-10-CM | POA: Diagnosis not present

## 2015-11-15 DIAGNOSIS — L293 Anogenital pruritus, unspecified: Secondary | ICD-10-CM | POA: Diagnosis not present

## 2016-01-02 ENCOUNTER — Emergency Department (HOSPITAL_COMMUNITY)
Admission: EM | Admit: 2016-01-02 | Discharge: 2016-01-03 | Disposition: A | Discharge status: Home / Self Care | Payer: Medicare Other | Attending: Emergency Medicine | Admitting: Emergency Medicine

## 2016-01-02 ENCOUNTER — Encounter (HOSPITAL_COMMUNITY): Payer: Self-pay | Admitting: Emergency Medicine

## 2016-01-02 DIAGNOSIS — I10 Essential (primary) hypertension: Secondary | ICD-10-CM | POA: Insufficient documentation

## 2016-01-02 DIAGNOSIS — Y929 Unspecified place or not applicable: Secondary | ICD-10-CM | POA: Diagnosis not present

## 2016-01-02 DIAGNOSIS — Y999 Unspecified external cause status: Secondary | ICD-10-CM | POA: Insufficient documentation

## 2016-01-02 DIAGNOSIS — X58XXXA Exposure to other specified factors, initial encounter: Secondary | ICD-10-CM | POA: Insufficient documentation

## 2016-01-02 DIAGNOSIS — T161XXA Foreign body in right ear, initial encounter: Secondary | ICD-10-CM | POA: Insufficient documentation

## 2016-01-02 DIAGNOSIS — Y939 Activity, unspecified: Secondary | ICD-10-CM | POA: Diagnosis not present

## 2016-01-02 HISTORY — DX: Disorder of thyroid, unspecified: E07.9

## 2016-01-02 HISTORY — DX: Essential (primary) hypertension: I10

## 2016-01-02 NOTE — ED Notes (Signed)
Pt states she has a bug in her R. Ear.

## 2016-01-03 NOTE — Discharge Instructions (Signed)
Ear Foreign Body °An ear foreign body is an object that is stuck in your ear. The object is usually stuck in the ear canal. °CAUSES °In all ages of people, the most common foreign bodies are insects that enter the ear canal. It is common for young children to put objects into the ear canal. These may include pebbles, beads, parts of toys, and any other small objects that fit into the ear. In adults, objects such as cotton swabs may become lodged in the ear canal.  °SIGNS AND SYMPTOMS °A foreign body in the ear may cause: °· Pain. °· Buzzing or roaring sounds. °· Hearing loss. °· Ear drainage or bleeding. °· Nausea and vomiting. °· A feeling that your ear is full. °DIAGNOSIS °Your health care provider may be able to diagnose an ear foreign body based on the information that you provide, your symptoms, and a physical exam. Your health care provider may also perform tests, such as testing your hearing and your ear pressure, to check for infection or other problems that are caused by the foreign body in your ear. °TREATMENT °Treatment depends on what the foreign body is, the location of the foreign body in your ear, and whether or not the foreign body has injured any part of your inner ear. If the foreign body is visible to your health care provider, it may be possible to remove the foreign body using: °· A tool, such as medical tweezers (forceps) or a suction tube (catheter). °· Irrigation. This uses water to flush the foreign body out of your ear. This is used only if the foreign body is not likely to swell or enlarge when it is put in water. °If the foreign body is not visible or your health care provider was not able to remove the foreign body, you may be referred to a specialist for removal. You may also be prescribed antibiotic medicine or ear drops to prevent infection. If the foreign body has caused injury to other parts of your ear, you may need additional treatment. °HOME CARE INSTRUCTIONS °· Keep all  follow-up visits as directed by your health care provider. This is important. °· Take medicines only as directed by your health care provider. °· If you were prescribed an antibiotic medicine, finish it all even if you start to feel better. °PREVENTION °· Keep small objects out of reach of young children. Tell children not to put anything in their ears. °· Do not put anything in your ear, including cotton swabs, to clean your ears. Talk to your health care provider about how to clean your ears safely. °SEEK MEDICAL CARE IF: °· You have a headache. °· Your have blood coming from your ear. °· You have a fever. °· You have increased pain or swelling of your ear. °· Your hearing is reduced. °· You have discharge coming from your ear. °  °This information is not intended to replace advice given to you by your health care provider. Make sure you discuss any questions you have with your health care provider. °  °Document Released: 05/30/2000 Document Revised: 06/23/2014 Document Reviewed: 01/16/2014 °Elsevier Interactive Patient Education ©2016 [employer] ° °

## 2016-01-03 NOTE — ED Provider Notes (Signed)
CSN: [id number]     Arrival date & time 01/02/16  2246 History   First MD Initiated Contact with Patient 01/02/16 2346     Chief Complaint  Patient presents with  . Bug in ear     HPI   26 YOM presents today with complaints of bug in her right ear. She reports she felt it fly into her ear this evening.  She notes that prior to my evaluation she feels as if the bug had flown out of her ear. She denies any complaints presently.   Past Medical History  Diagnosis Date  . Hypertension   . Thyroid disease    Past Surgical History  Procedure Laterality Date  . Colon surgery    . Abdominal hysterectomy     No family history on file. Social History  Substance Use Topics  . Smoking status: Never Smoker   . Smokeless tobacco: None  . Alcohol Use: None   OB History    No data available     Review of Systems  All other systems reviewed and are negative.     Allergies  Review of patient's allergies indicates not on file.  Home Medications   Prior to Admission medications   Not on File   BP 140/70 mmHg  Pulse 78  Temp(Src) 97.9 F (36.6 C)  Resp 18  Ht 5\' 4"  (1.626 m)  Wt 77.111 kg  BMI 29.17 kg/m2  SpO2 99% Physical Exam  Constitutional: She is oriented to person, place, and time. She appears well-developed and well-nourished.  HENT:  Head: Normocephalic and atraumatic.  Right Ear: Hearing, tympanic membrane, external ear and ear canal normal.  Left Ear: Hearing, tympanic membrane, external ear and ear canal normal.  Eyes: Conjunctivae are normal. Pupils are equal, round, and reactive to light. Right eye exhibits no discharge. Left eye exhibits no discharge. No scleral icterus.  Neck: Normal range of motion. No JVD present. No tracheal deviation present.  Pulmonary/Chest: Effort normal. No stridor.  Neurological: She is alert and oriented to person, place, and time. Coordination normal.  Psychiatric: She has a normal mood and affect. Her behavior is normal. Judgment  and thought content normal.  Nursing note and vitals reviewed.   ED Course  Procedures (including critical care time) Labs Review Labs Reviewed - No data to display  Imaging Review No results found. I have personally reviewed and evaluated these images and lab results as part of my medical decision-making.   EKG Interpretation None      MDM   Final diagnoses:  Foreign body in ear, right, initial encounter    Labs:  Imaging:  Consults:  Therapeutics:  Discharge Meds:   Assessment/Plan:No signs of foreign body in her ear, patient discharged home        Okey Regal, PA-C 01/03/16 0057  Noemi Chapel, MD 01/04/16 [phone]

## 2016-03-13 ENCOUNTER — Other Ambulatory Visit (HOSPITAL_COMMUNITY): Payer: Self-pay | Admitting: Family Medicine

## 2016-03-13 DIAGNOSIS — Z1231 Encounter for screening mammogram for malignant neoplasm of breast: Secondary | ICD-10-CM

## 2016-03-24 ENCOUNTER — Ambulatory Visit (HOSPITAL_COMMUNITY)
Admission: RE | Admit: 2016-03-24 | Discharge: 2016-03-24 | Disposition: A | Discharge status: Home / Self Care | Payer: Medicare Other | Source: Ambulatory Visit | Attending: Family Medicine | Admitting: Family Medicine

## 2016-03-24 DIAGNOSIS — Z1231 Encounter for screening mammogram for malignant neoplasm of breast: Secondary | ICD-10-CM | POA: Diagnosis not present

## 2016-05-02 DIAGNOSIS — Z23 Encounter for immunization: Secondary | ICD-10-CM | POA: Diagnosis not present

## 2016-05-02 DIAGNOSIS — Z1389 Encounter for screening for other disorder: Secondary | ICD-10-CM | POA: Diagnosis not present

## 2016-05-02 DIAGNOSIS — E063 Autoimmune thyroiditis: Secondary | ICD-10-CM | POA: Diagnosis not present

## 2016-05-02 DIAGNOSIS — Z0000 Encounter for general adult medical examination without abnormal findings: Secondary | ICD-10-CM | POA: Diagnosis not present

## 2016-05-02 DIAGNOSIS — M1991 Primary osteoarthritis, unspecified site: Secondary | ICD-10-CM | POA: Diagnosis not present

## 2016-05-02 DIAGNOSIS — E782 Mixed hyperlipidemia: Secondary | ICD-10-CM | POA: Diagnosis not present

## 2016-05-02 DIAGNOSIS — I10 Essential (primary) hypertension: Secondary | ICD-10-CM | POA: Diagnosis not present

## 2016-05-05 DIAGNOSIS — E063 Autoimmune thyroiditis: Secondary | ICD-10-CM | POA: Diagnosis not present

## 2016-05-05 DIAGNOSIS — Z0000 Encounter for general adult medical examination without abnormal findings: Secondary | ICD-10-CM | POA: Diagnosis not present

## 2016-05-05 DIAGNOSIS — Z1389 Encounter for screening for other disorder: Secondary | ICD-10-CM | POA: Diagnosis not present

## 2016-10-02 DIAGNOSIS — Z1389 Encounter for screening for other disorder: Secondary | ICD-10-CM | POA: Diagnosis not present

## 2016-10-02 DIAGNOSIS — I10 Essential (primary) hypertension: Secondary | ICD-10-CM | POA: Diagnosis not present

## 2017-03-09 ENCOUNTER — Other Ambulatory Visit (HOSPITAL_COMMUNITY): Payer: Self-pay | Admitting: Family Medicine

## 2017-03-09 DIAGNOSIS — Z1231 Encounter for screening mammogram for malignant neoplasm of breast: Secondary | ICD-10-CM

## 2017-03-25 ENCOUNTER — Ambulatory Visit (HOSPITAL_COMMUNITY)
Admission: RE | Admit: 2017-03-25 | Discharge: 2017-03-25 | Disposition: A | Discharge status: Home / Self Care | Payer: Medicare Other | Source: Ambulatory Visit | Attending: Family Medicine | Admitting: Family Medicine

## 2017-03-25 DIAGNOSIS — Z1231 Encounter for screening mammogram for malignant neoplasm of breast: Secondary | ICD-10-CM | POA: Insufficient documentation

## 2017-04-15 DIAGNOSIS — Z23 Encounter for immunization: Secondary | ICD-10-CM | POA: Diagnosis not present

## 2017-05-13 DIAGNOSIS — Z1389 Encounter for screening for other disorder: Secondary | ICD-10-CM | POA: Diagnosis not present

## 2017-05-13 DIAGNOSIS — Z0000 Encounter for general adult medical examination without abnormal findings: Secondary | ICD-10-CM | POA: Diagnosis not present

## 2017-05-14 DIAGNOSIS — Z0000 Encounter for general adult medical examination without abnormal findings: Secondary | ICD-10-CM | POA: Diagnosis not present

## 2017-05-14 DIAGNOSIS — Z1389 Encounter for screening for other disorder: Secondary | ICD-10-CM | POA: Diagnosis not present

## 2017-07-18 DIAGNOSIS — E039 Hypothyroidism, unspecified: Secondary | ICD-10-CM | POA: Diagnosis not present

## 2017-10-26 DIAGNOSIS — H25013 Cortical age-related cataract, bilateral: Secondary | ICD-10-CM | POA: Diagnosis not present

## 2017-10-26 DIAGNOSIS — H5200 Hypermetropia, unspecified eye: Secondary | ICD-10-CM | POA: Diagnosis not present

## 2017-10-26 DIAGNOSIS — Z0100 Encounter for examination of eyes and vision without abnormal findings: Secondary | ICD-10-CM | POA: Diagnosis not present

## 2018-04-06 ENCOUNTER — Other Ambulatory Visit (HOSPITAL_COMMUNITY): Payer: Self-pay | Admitting: Family Medicine

## 2018-04-06 DIAGNOSIS — Z1231 Encounter for screening mammogram for malignant neoplasm of breast: Secondary | ICD-10-CM

## 2018-04-14 ENCOUNTER — Ambulatory Visit (HOSPITAL_COMMUNITY)
Admission: RE | Admit: 2018-04-14 | Discharge: 2018-04-14 | Disposition: A | Discharge status: Home / Self Care | Payer: Medicare HMO | Source: Ambulatory Visit | Attending: Family Medicine | Admitting: Family Medicine

## 2018-04-14 DIAGNOSIS — Z1231 Encounter for screening mammogram for malignant neoplasm of breast: Secondary | ICD-10-CM

## 2018-04-14 DIAGNOSIS — R69 Illness, unspecified: Secondary | ICD-10-CM | POA: Diagnosis not present

## 2018-05-24 DIAGNOSIS — Z1389 Encounter for screening for other disorder: Secondary | ICD-10-CM | POA: Diagnosis not present

## 2018-05-24 DIAGNOSIS — E7849 Other hyperlipidemia: Secondary | ICD-10-CM | POA: Diagnosis not present

## 2018-05-24 DIAGNOSIS — E663 Overweight: Secondary | ICD-10-CM | POA: Diagnosis not present

## 2018-05-24 DIAGNOSIS — Z6828 Body mass index (BMI) 28.0-28.9, adult: Secondary | ICD-10-CM | POA: Diagnosis not present

## 2018-05-24 DIAGNOSIS — Z0000 Encounter for general adult medical examination without abnormal findings: Secondary | ICD-10-CM | POA: Diagnosis not present

## 2018-12-09 DIAGNOSIS — R0989 Other specified symptoms and signs involving the circulatory and respiratory systems: Secondary | ICD-10-CM | POA: Diagnosis not present

## 2018-12-09 DIAGNOSIS — Z1389 Encounter for screening for other disorder: Secondary | ICD-10-CM | POA: Diagnosis not present

## 2018-12-09 DIAGNOSIS — E063 Autoimmune thyroiditis: Secondary | ICD-10-CM | POA: Diagnosis not present

## 2018-12-09 DIAGNOSIS — E663 Overweight: Secondary | ICD-10-CM | POA: Diagnosis not present

## 2018-12-09 DIAGNOSIS — Z6829 Body mass index (BMI) 29.0-29.9, adult: Secondary | ICD-10-CM | POA: Diagnosis not present

## 2018-12-09 DIAGNOSIS — I10 Essential (primary) hypertension: Secondary | ICD-10-CM | POA: Diagnosis not present

## 2018-12-10 DIAGNOSIS — E063 Autoimmune thyroiditis: Secondary | ICD-10-CM | POA: Diagnosis not present

## 2018-12-10 DIAGNOSIS — R0989 Other specified symptoms and signs involving the circulatory and respiratory systems: Secondary | ICD-10-CM | POA: Diagnosis not present

## 2018-12-10 DIAGNOSIS — E7849 Other hyperlipidemia: Secondary | ICD-10-CM | POA: Diagnosis not present

## 2018-12-10 DIAGNOSIS — I10 Essential (primary) hypertension: Secondary | ICD-10-CM | POA: Diagnosis not present

## 2018-12-10 DIAGNOSIS — Z1389 Encounter for screening for other disorder: Secondary | ICD-10-CM | POA: Diagnosis not present

## 2018-12-14 ENCOUNTER — Other Ambulatory Visit (HOSPITAL_COMMUNITY): Payer: Self-pay | Admitting: Family Medicine

## 2018-12-14 ENCOUNTER — Other Ambulatory Visit: Payer: Self-pay | Admitting: Family Medicine

## 2018-12-14 DIAGNOSIS — R0989 Other specified symptoms and signs involving the circulatory and respiratory systems: Secondary | ICD-10-CM

## 2018-12-20 ENCOUNTER — Ambulatory Visit (HOSPITAL_COMMUNITY): Payer: Medicare HMO

## 2018-12-28 ENCOUNTER — Other Ambulatory Visit: Payer: Self-pay

## 2018-12-28 ENCOUNTER — Ambulatory Visit (HOSPITAL_COMMUNITY)
Admission: RE | Admit: 2018-12-28 | Discharge: 2018-12-28 | Disposition: A | Discharge status: Home / Self Care | Payer: Medicare HMO | Source: Ambulatory Visit | Attending: Family Medicine | Admitting: Family Medicine

## 2018-12-28 DIAGNOSIS — R0989 Other specified symptoms and signs involving the circulatory and respiratory systems: Secondary | ICD-10-CM | POA: Insufficient documentation

## 2018-12-28 DIAGNOSIS — Z0389 Encounter for observation for other suspected diseases and conditions ruled out: Secondary | ICD-10-CM | POA: Diagnosis not present

## 2019-03-23 ENCOUNTER — Other Ambulatory Visit (HOSPITAL_COMMUNITY): Payer: Self-pay | Admitting: Family Medicine

## 2019-03-23 DIAGNOSIS — Z1231 Encounter for screening mammogram for malignant neoplasm of breast: Secondary | ICD-10-CM

## 2019-04-06 DIAGNOSIS — R69 Illness, unspecified: Secondary | ICD-10-CM | POA: Diagnosis not present

## 2019-04-18 ENCOUNTER — Ambulatory Visit (HOSPITAL_COMMUNITY)
Admission: RE | Admit: 2019-04-18 | Discharge: 2019-04-18 | Disposition: A | Discharge status: Home / Self Care | Payer: Medicare HMO | Source: Ambulatory Visit | Attending: Family Medicine | Admitting: Family Medicine

## 2019-04-18 ENCOUNTER — Other Ambulatory Visit: Payer: Self-pay

## 2019-04-18 DIAGNOSIS — Z1231 Encounter for screening mammogram for malignant neoplasm of breast: Secondary | ICD-10-CM | POA: Diagnosis not present

## 2019-04-21 ENCOUNTER — Other Ambulatory Visit (HOSPITAL_COMMUNITY): Payer: Self-pay | Admitting: Family Medicine

## 2019-04-27 ENCOUNTER — Other Ambulatory Visit (HOSPITAL_COMMUNITY): Payer: Self-pay | Admitting: Family Medicine

## 2019-04-27 DIAGNOSIS — R928 Other abnormal and inconclusive findings on diagnostic imaging of breast: Secondary | ICD-10-CM

## 2019-05-03 ENCOUNTER — Ambulatory Visit (HOSPITAL_COMMUNITY)
Admission: RE | Admit: 2019-05-03 | Discharge: 2019-05-03 | Disposition: A | Discharge status: Home / Self Care | Payer: Medicare HMO | Source: Ambulatory Visit | Attending: Family Medicine | Admitting: Family Medicine

## 2019-05-03 ENCOUNTER — Other Ambulatory Visit: Payer: Self-pay

## 2019-05-03 ENCOUNTER — Other Ambulatory Visit (HOSPITAL_COMMUNITY): Payer: Self-pay | Admitting: Family Medicine

## 2019-05-03 DIAGNOSIS — R922 Inconclusive mammogram: Secondary | ICD-10-CM | POA: Diagnosis not present

## 2019-05-03 DIAGNOSIS — R928 Other abnormal and inconclusive findings on diagnostic imaging of breast: Secondary | ICD-10-CM | POA: Insufficient documentation

## 2019-05-03 DIAGNOSIS — N6323 Unspecified lump in the left breast, lower outer quadrant: Secondary | ICD-10-CM | POA: Diagnosis not present

## 2019-05-10 ENCOUNTER — Ambulatory Visit (HOSPITAL_COMMUNITY): Admission: RE | Admit: 2019-05-10 | Payer: Medicare HMO | Source: Ambulatory Visit

## 2019-05-17 ENCOUNTER — Ambulatory Visit (HOSPITAL_COMMUNITY)
Admission: RE | Admit: 2019-05-17 | Discharge: 2019-05-17 | Disposition: A | Discharge status: Home / Self Care | Payer: Medicare HMO | Source: Ambulatory Visit | Attending: Family Medicine | Admitting: Family Medicine

## 2019-05-17 ENCOUNTER — Other Ambulatory Visit: Payer: Self-pay

## 2019-05-17 ENCOUNTER — Other Ambulatory Visit (HOSPITAL_COMMUNITY): Payer: Self-pay | Admitting: Family Medicine

## 2019-05-17 DIAGNOSIS — R928 Other abnormal and inconclusive findings on diagnostic imaging of breast: Secondary | ICD-10-CM | POA: Diagnosis not present

## 2019-05-17 DIAGNOSIS — N6342 Unspecified lump in left breast, subareolar: Secondary | ICD-10-CM | POA: Diagnosis not present

## 2019-05-17 MED ORDER — LIDOCAINE HCL (PF) 2 % IJ SOLN
INTRAMUSCULAR | Status: AC
  Administered 2019-05-17: 09:00:00
  Filled 2019-05-17: qty 10

## 2019-05-17 MED ORDER — SODIUM BICARBONATE 4.2 % IV SOLN
INTRAVENOUS | Status: AC
  Administered 2019-05-17: 09:00:00
  Filled 2019-05-17: qty 10

## 2019-05-17 MED ORDER — LIDOCAINE-EPINEPHRINE (PF) 1 %-1:200000 IJ SOLN
INTRAMUSCULAR | Status: AC
  Administered 2019-05-17: 09:00:00
  Filled 2019-05-17: qty 30

## 2019-05-18 HISTORY — PX: BREAST BIOPSY: SHX20

## 2019-05-18 LAB — SURGICAL PATHOLOGY

## 2019-07-05 ENCOUNTER — Ambulatory Visit: Payer: Medicare HMO | Admitting: General Surgery

## 2019-07-05 ENCOUNTER — Encounter: Payer: Self-pay | Admitting: General Surgery

## 2019-07-05 ENCOUNTER — Other Ambulatory Visit: Payer: Self-pay

## 2019-07-05 VITALS — BP 124/73 | HR 74 | Temp 98.5°F | Resp 16 | Ht 64.0 in | Wt 165.0 lb

## 2019-07-05 DIAGNOSIS — D242 Benign neoplasm of left breast: Secondary | ICD-10-CM

## 2019-07-06 NOTE — Progress Notes (Signed)
Theresa Buchanan; [id number]; [birth year]   HPI Patient is a 74 year old black female who was referred to my care by Delman Cheadle for evaluation treatment of a papilloma in the left breast.  This was found on routine mammography.  Core biopsy reveals intraductal papilloma.  Patient denies any family history of breast cancer.  She has had no nipple discharge or breast pain.  She currently has 0 out of 10 pain. Past Medical History:  Diagnosis Date  . Hypertension   . Thyroid disease     Past Surgical History:  Procedure Laterality Date  . ABDOMINAL HYSTERECTOMY    . COLON SURGERY      History reviewed. No pertinent family history.  Current Outpatient Medications on File Prior to Visit  Medication Sig Dispense Refill  . aspirin EC 81 MG tablet Take 81 mg by mouth daily.    Marland Kitchen levothyroxine (SYNTHROID) 88 MCG tablet Take 88 mcg by mouth daily.    Marland Kitchen lisinopril-hydrochlorothiazide (ZESTORETIC) 20-25 MG tablet Take 1 tablet by mouth daily.     No current facility-administered medications on file prior to visit.    Not on File  Social History   Substance and Sexual Activity  Alcohol Use Never    Social History   Tobacco Use  Smoking Status Never Smoker  Smokeless Tobacco Never Used    Review of Systems  Constitutional: Negative.   HENT: Negative.   Eyes: Negative.   Respiratory: Negative.   Cardiovascular: Negative.   Gastrointestinal: Negative.   Genitourinary: Negative.   Musculoskeletal: Negative.   Skin: Negative.   Neurological: Negative.   Endo/Heme/Allergies: Negative.   Psychiatric/Behavioral: Negative.     Objective   Vitals:   07/05/19 1317  BP: 124/73  Pulse: 74  Resp: 16  Temp: 98.5 F (36.9 C)  SpO2: 98%    Physical Exam Vitals reviewed.  Constitutional:      Appearance: Normal appearance. She is not ill-appearing.  HENT:     Head: Normocephalic and atraumatic.  Cardiovascular:     Rate and Rhythm: Normal rate and regular rhythm.   Heart sounds: Normal heart sounds. No murmur. No friction rub. No gallop.   Pulmonary:     Effort: Pulmonary effort is normal. No respiratory distress.     Breath sounds: Normal breath sounds. No stridor. No wheezing, rhonchi or rales.  Lymphadenopathy:     Cervical: No cervical adenopathy.  Skin:    General: Skin is warm.  Neurological:     Mental Status: She is alert and oriented to person, place, and time.   Breast: No dominant mass, nipple discharge, or dimpling in either breast.  I could not express any fluid from the left nipple.  Both axillas negative for palpable nodes.  Mammogram, ultrasound, and pathology reports reviewed  Assessment  Intraductal papilloma, left breast, no atypia seen. Plan   Given the current situation with Covid, elective surgeries are on hold.  She has a low risk of this being cancer, though it is not 0%.  Patient did not want want surgical intervention anyway, which is fine with me.  I did suggest the patient have a follow-up diagnostic left breast mammogram in 6 months.  We will follow up with her at that time.  Should she develop any nipple discharge or lumps, she was instructed to return to my care.  She understands and agrees.

## 2019-07-15 DIAGNOSIS — Z6829 Body mass index (BMI) 29.0-29.9, adult: Secondary | ICD-10-CM | POA: Diagnosis not present

## 2019-07-15 DIAGNOSIS — E039 Hypothyroidism, unspecified: Secondary | ICD-10-CM | POA: Diagnosis not present

## 2019-07-15 DIAGNOSIS — I10 Essential (primary) hypertension: Secondary | ICD-10-CM | POA: Diagnosis not present

## 2019-07-15 DIAGNOSIS — Z1389 Encounter for screening for other disorder: Secondary | ICD-10-CM | POA: Diagnosis not present

## 2019-07-15 DIAGNOSIS — R0989 Other specified symptoms and signs involving the circulatory and respiratory systems: Secondary | ICD-10-CM | POA: Diagnosis not present

## 2019-07-15 DIAGNOSIS — Z0000 Encounter for general adult medical examination without abnormal findings: Secondary | ICD-10-CM | POA: Diagnosis not present

## 2019-07-18 DIAGNOSIS — Z6829 Body mass index (BMI) 29.0-29.9, adult: Secondary | ICD-10-CM | POA: Diagnosis not present

## 2019-07-18 DIAGNOSIS — E039 Hypothyroidism, unspecified: Secondary | ICD-10-CM | POA: Diagnosis not present

## 2019-07-18 DIAGNOSIS — Z1389 Encounter for screening for other disorder: Secondary | ICD-10-CM | POA: Diagnosis not present

## 2019-07-18 DIAGNOSIS — E663 Overweight: Secondary | ICD-10-CM | POA: Diagnosis not present

## 2019-07-18 DIAGNOSIS — Z0000 Encounter for general adult medical examination without abnormal findings: Secondary | ICD-10-CM | POA: Diagnosis not present

## 2019-07-18 DIAGNOSIS — E7849 Other hyperlipidemia: Secondary | ICD-10-CM | POA: Diagnosis not present

## 2019-09-20 DIAGNOSIS — E663 Overweight: Secondary | ICD-10-CM | POA: Diagnosis not present

## 2019-09-20 DIAGNOSIS — E039 Hypothyroidism, unspecified: Secondary | ICD-10-CM | POA: Diagnosis not present

## 2019-09-20 DIAGNOSIS — I739 Peripheral vascular disease, unspecified: Secondary | ICD-10-CM | POA: Diagnosis not present

## 2019-09-20 DIAGNOSIS — Z6827 Body mass index (BMI) 27.0-27.9, adult: Secondary | ICD-10-CM | POA: Diagnosis not present

## 2019-09-20 DIAGNOSIS — Z833 Family history of diabetes mellitus: Secondary | ICD-10-CM | POA: Diagnosis not present

## 2019-09-20 DIAGNOSIS — Z7982 Long term (current) use of aspirin: Secondary | ICD-10-CM | POA: Diagnosis not present

## 2019-09-20 DIAGNOSIS — Z823 Family history of stroke: Secondary | ICD-10-CM | POA: Diagnosis not present

## 2019-09-20 DIAGNOSIS — I10 Essential (primary) hypertension: Secondary | ICD-10-CM | POA: Diagnosis not present

## 2019-09-20 DIAGNOSIS — Z008 Encounter for other general examination: Secondary | ICD-10-CM | POA: Diagnosis not present

## 2019-11-01 ENCOUNTER — Encounter: Payer: Self-pay | Admitting: General Practice

## 2020-04-12 DIAGNOSIS — Z23 Encounter for immunization: Secondary | ICD-10-CM | POA: Diagnosis not present

## 2020-06-26 DIAGNOSIS — H524 Presbyopia: Secondary | ICD-10-CM | POA: Diagnosis not present

## 2020-07-27 ENCOUNTER — Other Ambulatory Visit (HOSPITAL_COMMUNITY): Payer: Self-pay | Admitting: Family Medicine

## 2020-07-27 DIAGNOSIS — E663 Overweight: Secondary | ICD-10-CM | POA: Diagnosis not present

## 2020-07-27 DIAGNOSIS — Z1231 Encounter for screening mammogram for malignant neoplasm of breast: Secondary | ICD-10-CM

## 2020-07-27 DIAGNOSIS — Z0000 Encounter for general adult medical examination without abnormal findings: Secondary | ICD-10-CM | POA: Diagnosis not present

## 2020-07-27 DIAGNOSIS — Z1389 Encounter for screening for other disorder: Secondary | ICD-10-CM | POA: Diagnosis not present

## 2020-07-27 DIAGNOSIS — Z6828 Body mass index (BMI) 28.0-28.9, adult: Secondary | ICD-10-CM | POA: Diagnosis not present

## 2020-07-27 DIAGNOSIS — I10 Essential (primary) hypertension: Secondary | ICD-10-CM | POA: Diagnosis not present

## 2020-07-27 DIAGNOSIS — E039 Hypothyroidism, unspecified: Secondary | ICD-10-CM | POA: Diagnosis not present

## 2020-07-27 DIAGNOSIS — Z1331 Encounter for screening for depression: Secondary | ICD-10-CM | POA: Diagnosis not present

## 2020-07-27 DIAGNOSIS — E7849 Other hyperlipidemia: Secondary | ICD-10-CM | POA: Diagnosis not present

## 2020-07-27 DIAGNOSIS — E079 Disorder of thyroid, unspecified: Secondary | ICD-10-CM | POA: Diagnosis not present

## 2020-07-27 DIAGNOSIS — R0989 Other specified symptoms and signs involving the circulatory and respiratory systems: Secondary | ICD-10-CM | POA: Diagnosis not present

## 2020-07-30 ENCOUNTER — Other Ambulatory Visit (HOSPITAL_COMMUNITY): Payer: Self-pay | Admitting: Family Medicine

## 2020-07-30 ENCOUNTER — Other Ambulatory Visit: Payer: Self-pay | Admitting: Family Medicine

## 2020-07-30 DIAGNOSIS — R0989 Other specified symptoms and signs involving the circulatory and respiratory systems: Secondary | ICD-10-CM

## 2020-07-30 DIAGNOSIS — E079 Disorder of thyroid, unspecified: Secondary | ICD-10-CM

## 2020-08-06 ENCOUNTER — Other Ambulatory Visit: Payer: Self-pay

## 2020-08-06 ENCOUNTER — Ambulatory Visit (HOSPITAL_COMMUNITY)
Admission: RE | Admit: 2020-08-06 | Discharge: 2020-08-06 | Disposition: A | Discharge status: Home / Self Care | Payer: Medicare HMO | Source: Ambulatory Visit | Attending: Family Medicine | Admitting: Family Medicine

## 2020-08-06 DIAGNOSIS — E079 Disorder of thyroid, unspecified: Secondary | ICD-10-CM | POA: Diagnosis not present

## 2020-08-06 DIAGNOSIS — E042 Nontoxic multinodular goiter: Secondary | ICD-10-CM | POA: Diagnosis not present

## 2020-08-06 DIAGNOSIS — R0989 Other specified symptoms and signs involving the circulatory and respiratory systems: Secondary | ICD-10-CM | POA: Diagnosis not present

## 2020-08-06 DIAGNOSIS — I10 Essential (primary) hypertension: Secondary | ICD-10-CM | POA: Diagnosis not present

## 2020-11-12 DIAGNOSIS — Z7982 Long term (current) use of aspirin: Secondary | ICD-10-CM | POA: Diagnosis not present

## 2020-11-12 DIAGNOSIS — Z823 Family history of stroke: Secondary | ICD-10-CM | POA: Diagnosis not present

## 2020-11-12 DIAGNOSIS — Z8249 Family history of ischemic heart disease and other diseases of the circulatory system: Secondary | ICD-10-CM | POA: Diagnosis not present

## 2020-11-12 DIAGNOSIS — K5900 Constipation, unspecified: Secondary | ICD-10-CM | POA: Diagnosis not present

## 2020-11-12 DIAGNOSIS — I499 Cardiac arrhythmia, unspecified: Secondary | ICD-10-CM | POA: Diagnosis not present

## 2020-11-12 DIAGNOSIS — M1990 Unspecified osteoarthritis, unspecified site: Secondary | ICD-10-CM | POA: Diagnosis not present

## 2020-11-12 DIAGNOSIS — E039 Hypothyroidism, unspecified: Secondary | ICD-10-CM | POA: Diagnosis not present

## 2020-11-12 DIAGNOSIS — I739 Peripheral vascular disease, unspecified: Secondary | ICD-10-CM | POA: Diagnosis not present

## 2020-11-12 DIAGNOSIS — Z008 Encounter for other general examination: Secondary | ICD-10-CM | POA: Diagnosis not present

## 2020-11-12 DIAGNOSIS — Z809 Family history of malignant neoplasm, unspecified: Secondary | ICD-10-CM | POA: Diagnosis not present

## 2020-11-12 DIAGNOSIS — I10 Essential (primary) hypertension: Secondary | ICD-10-CM | POA: Diagnosis not present

## 2020-11-28 ENCOUNTER — Ambulatory Visit: Payer: Medicare HMO | Admitting: "Endocrinology

## 2020-11-28 ENCOUNTER — Encounter: Payer: Self-pay | Admitting: "Endocrinology

## 2020-11-28 ENCOUNTER — Other Ambulatory Visit: Payer: Self-pay

## 2020-11-28 VITALS — BP 125/70 | HR 65 | Ht 64.0 in | Wt 155.4 lb

## 2020-11-28 DIAGNOSIS — E890 Postprocedural hypothyroidism: Secondary | ICD-10-CM | POA: Diagnosis not present

## 2020-11-28 DIAGNOSIS — E042 Nontoxic multinodular goiter: Secondary | ICD-10-CM

## 2020-11-28 NOTE — Progress Notes (Signed)
Endocrinology Consult Note                                            11/28/2020, 6:23 PM   Subjective:    Patient ID: Theresa Buchanan, female    DOB: 08-04-1945, PCP Jake Samples, PA-C   Past Medical History:  Diagnosis Date   Hypertension    Thyroid disease    Past Surgical History:  Procedure Laterality Date   ABDOMINAL HYSTERECTOMY     COLON SURGERY     Social History   Socioeconomic History   Marital status: Married    Spouse name: Not on file   Number of children: Not on file   Years of education: Not on file   Highest education level: Not on file  Occupational History   Not on file  Tobacco Use   Smoking status: Never   Smokeless tobacco: Never  Substance and Sexual Activity   Alcohol use: Never   Drug use: Never   Sexual activity: Not on file  Other Topics Concern   Not on file  Social History Narrative   Not on file   Social Determinants of Health   Financial Resource Strain: Not on file  Food Insecurity: Not on file  Transportation Needs: Not on file  Physical Activity: Not on file  Stress: Not on file  Social Connections: Not on file   Family History  Problem Relation Age of Onset   Hypertension Mother    Thyroid disease Mother    Diabetes Mother    Outpatient Encounter Medications as of 11/28/2020  Medication Sig   aspirin EC 81 MG tablet Take 81 mg by mouth 3 (three) times a week.   levothyroxine (SYNTHROID) 88 MCG tablet Take 88 mcg by mouth daily.   lisinopril-hydrochlorothiazide (ZESTORETIC) 20-25 MG tablet Take 1 tablet by mouth daily.   No facility-administered encounter medications on file as of 11/28/2020.   ALLERGIES: No Known Allergies  VACCINATION STATUS:  There is no immunization history on file for this patient.  HPI Theresa Buchanan is 75 y.o. female who presents today with a medical history as above. she is being seen in consultation for multinodular goiter requested by Jake Samples, PA-C.   -History is obtained directly from the patient as well as review of her available records.  Accordingly, she was diagnosed with diffuse multinodular goiter in 2006 for which she received radioactive iodine therapy.  She was subsequently initiated on thyroid hormone replacement.  She received various doses over the years.  She is currently on levothyroxine 88 mcg p.o. daily with good consistency. More recently on August 06, 2020, she underwent thyroid ultrasound.  This study showed multiple nodules on bilateral thyroid lobes which are generally shrunk.  2.1 cm suspicious nodule on the right lobe and 1.2 cm nodule on the left lobe.  She denies dysphagia, shortness of breath, nor voice change.  She denies family history of thyroid malignancy, however reports very forms of thyroid dysfunction in her family members including her mother and sisters. She does not have recent thyroid function test. Review of Systems  Constitutional: + Progressive weight loss,  no fatigue, no subjective hyperthermia, no subjective hypothermia Eyes: no blurry vision, no xerophthalmia ENT: no sore throat, no nodules palpated in throat, no dysphagia/odynophagia, no hoarseness Cardiovascular: no Chest Pain, no Shortness of Breath,  no palpitations, no leg swelling Respiratory: no cough, no shortness of breath Gastrointestinal: no Nausea/Vomiting/Diarhhea Musculoskeletal: no muscle/joint aches Skin: no rashes Neurological: no tremors, no numbness, no tingling, no dizziness Psychiatric: no depression, no anxiety  Objective:    Vitals with BMI 11/28/2020 07/05/2019 01/02/2016  Height 5\' 4"  5\' 4"  5\' 4"   Weight 155 lbs 6 oz 165 lbs 170 lbs  BMI 26.66 84.69 62.9  Systolic 528 413 244  Diastolic 70 73 70  Pulse 65 74 78    BP 125/70   Pulse 65   Ht 5\' 4"  (1.626 m)   Wt 155 lb 6.4 oz (70.5 kg)   BMI 26.67 kg/m   Wt Readings from Last 3 Encounters:  11/28/20 155 lb 6.4 oz (70.5 kg)  07/05/19 165 lb (74.8 kg)  01/02/16  170 lb (77.1 kg)    Physical Exam  Constitutional:  Body mass index is 26.67 kg/m.,  not in acute distress, normal state of mind Eyes: PERRLA, EOMI, no exophthalmos ENT: moist mucous membranes, no gross thyromegaly, no gross cervical lymphadenopathy Cardiovascular: normal precordial activity, Regular Rate and Rhythm, no Murmur/Rubs/Gallops Respiratory:  adequate breathing efforts, no gross chest deformity, Clear to auscultation bilaterally Gastrointestinal: abdomen soft, Non -tender, No distension, Bowel Sounds present, no gross organomegaly Musculoskeletal: no gross deformities, strength intact in all four extremities Skin: moist, warm, no rashes Neurological: no tremor with outstretched hands, Deep tendon reflexes normal in bilateral lower extremities.  Thyroid ultrasound August 06, 2020:  Right lobe 3.1 cm with 2.1 suspicious nodule. Left lobe 3.4 cm with 1.2 cm nonsuspicious nodule.   Assessment & Plan:   1. Hypothyroidism following radioiodine therapy  2. Multinodular goiter   - KELTY SZAFRAN  is being seen at a kind request of Jake Samples, PA-C. - I have reviewed her available thyroid records and clinically evaluated the patient. - Based on these reviews, she has postablative hypothyroidism, multinodular goiter,  however,  there is not sufficient information to proceed with definitive treatment plan.  - she will need a repeat,  more complete thyroid function tests today.  In the meantime, she is advised to continue levothyroxine 88 mcg daily before breakfast.    - We discussed about the correct intake of her thyroid hormone, on empty stomach at fasting, with water, separated by at least 30 minutes from breakfast and other medications,  and separated by more than 4 hours from calcium, iron, multivitamins, acid reflux medications (PPIs). -Patient is made aware of the fact that thyroid hormone replacement is needed for life, dose to be adjusted by periodic  monitoring of thyroid function tests.  Regarding her suspicious 2.1 cm right sided nodule, she is approached for fine-needle aspiration and she is in agreement.  This procedure will be scheduled to be performed at [facility] radiology.  She will return with her biopsy results. Further steps on her thyroid will depend on the biopsy results. - I did not initiate any new prescriptions today. - she is advised to maintain close follow up with Jake Samples, PA-C for primary care needs.   - Time spent with the patient: 60 minutes, of which >50% was spent in  counseling her about her postablative hypothyroidism, multinodular goiter and the rest in obtaining information about her symptoms, reviewing her previous labs/studies ( including abstractions from other facilities),  evaluations, and treatments,  and developing a plan to confirm diagnosis and long term treatment based on the latest standards of care/guidelines; and documenting her care.  Jenny Reichmann [place] participated in the discussions, expressed understanding, and voiced agreement with the above plans.  All questions were answered to her satisfaction. she is encouraged to contact clinic should she have any questions or concerns prior to her return visit.  Follow up plan: Return in about 8 weeks (around 01/23/2021) for Labs Today- Non-Fasting Ok, F/U with Biopsy Results.   Glade Lloyd, MD [facility] [address] [place], [place] [address] Phone: [phone]  Fax: [phone]     11/28/2020, 6:23 PM  This note was partially dictated with voice recognition software. Similar sounding words can be transcribed inadequately or may not  be corrected upon review.

## 2020-11-29 LAB — THYROGLOBULIN ANTIBODY: Thyroglobulin Antibody: <1 IU/mL (ref 0.0–0.9)

## 2020-11-29 LAB — T4, FREE: Free T4: 2.02 ng/dL — ABNORMAL HIGH (ref 0.82–1.77)

## 2020-11-29 LAB — TSH: TSH: 2.67 u[IU]/mL (ref 0.450–4.500)

## 2020-11-29 LAB — THYROID PEROXIDASE ANTIBODY: Thyroperoxidase Ab SerPl-aCnc: <8 IU/mL (ref 0–34)

## 2020-12-05 ENCOUNTER — Other Ambulatory Visit (HOSPITAL_COMMUNITY): Payer: Self-pay | Admitting: Family Medicine

## 2020-12-05 DIAGNOSIS — Z1231 Encounter for screening mammogram for malignant neoplasm of breast: Secondary | ICD-10-CM

## 2020-12-12 ENCOUNTER — Other Ambulatory Visit (HOSPITAL_COMMUNITY): Payer: Self-pay | Admitting: Family Medicine

## 2020-12-12 ENCOUNTER — Encounter (HOSPITAL_COMMUNITY): Payer: Self-pay

## 2020-12-12 ENCOUNTER — Ambulatory Visit (HOSPITAL_COMMUNITY)
Admission: RE | Admit: 2020-12-12 | Discharge: 2020-12-12 | Disposition: A | Discharge status: Home / Self Care | Payer: Medicare HMO | Source: Ambulatory Visit | Attending: "Endocrinology | Admitting: "Endocrinology

## 2020-12-12 ENCOUNTER — Other Ambulatory Visit: Payer: Self-pay

## 2020-12-12 DIAGNOSIS — E042 Nontoxic multinodular goiter: Secondary | ICD-10-CM | POA: Insufficient documentation

## 2020-12-12 DIAGNOSIS — E041 Nontoxic single thyroid nodule: Secondary | ICD-10-CM | POA: Diagnosis not present

## 2020-12-12 DIAGNOSIS — C73 Malignant neoplasm of thyroid gland: Secondary | ICD-10-CM | POA: Diagnosis not present

## 2020-12-12 DIAGNOSIS — D242 Benign neoplasm of left breast: Secondary | ICD-10-CM

## 2020-12-12 MED ORDER — LIDOCAINE HCL 2 % IJ SOLN
INTRAMUSCULAR | Status: AC
  Administered 2020-12-12: 200 mg
  Filled 2020-12-12: qty 10

## 2020-12-12 NOTE — Sedation Documentation (Signed)
PT tolerated thyroid biopsy procedure well today. Labs obtained and sent for pathology. PT ambulatory at discharge with no acute distress noted, given ice pack and verbalized understanding of discharge instructions.

## 2020-12-14 LAB — CYTOLOGY - NON PAP

## 2020-12-18 ENCOUNTER — Ambulatory Visit (HOSPITAL_COMMUNITY)
Admission: RE | Admit: 2020-12-18 | Discharge: 2020-12-18 | Disposition: A | Discharge status: Home / Self Care | Payer: Medicare HMO | Source: Ambulatory Visit | Attending: Family Medicine | Admitting: Family Medicine

## 2020-12-18 ENCOUNTER — Other Ambulatory Visit: Payer: Self-pay

## 2020-12-18 DIAGNOSIS — D242 Benign neoplasm of left breast: Secondary | ICD-10-CM | POA: Diagnosis not present

## 2020-12-18 DIAGNOSIS — N6452 Nipple discharge: Secondary | ICD-10-CM | POA: Diagnosis not present

## 2020-12-18 DIAGNOSIS — R922 Inconclusive mammogram: Secondary | ICD-10-CM | POA: Diagnosis not present

## 2020-12-21 ENCOUNTER — Encounter (HOSPITAL_COMMUNITY): Payer: Self-pay

## 2020-12-21 ENCOUNTER — Ambulatory Visit (HOSPITAL_COMMUNITY): Payer: Medicare HMO

## 2021-01-18 ENCOUNTER — Encounter: Payer: Self-pay | Admitting: "Endocrinology

## 2021-01-18 ENCOUNTER — Ambulatory Visit: Payer: Medicare HMO | Admitting: "Endocrinology

## 2021-01-18 ENCOUNTER — Other Ambulatory Visit: Payer: Self-pay

## 2021-01-18 VITALS — BP 125/74 | HR 73 | Ht 64.0 in | Wt 155.4 lb

## 2021-01-18 DIAGNOSIS — E042 Nontoxic multinodular goiter: Secondary | ICD-10-CM | POA: Diagnosis not present

## 2021-01-18 DIAGNOSIS — R899 Unspecified abnormal finding in specimens from other organs, systems and tissues: Secondary | ICD-10-CM | POA: Diagnosis not present

## 2021-01-18 DIAGNOSIS — E890 Postprocedural hypothyroidism: Secondary | ICD-10-CM | POA: Diagnosis not present

## 2021-01-18 MED ORDER — LEVOTHYROXINE SODIUM 75 MCG PO TABS: 75.0000 ug | ORAL_TABLET | Freq: Every day | ORAL | 1 refills | Status: DC

## 2021-01-18 NOTE — Progress Notes (Signed)
01/18/2021, 12:06 PM  Endocrinology follow-up note   Subjective:    Patient ID: Theresa Buchanan, female    DOB: 08/23/1945, PCP Jake Samples, PA-C   Past Medical History:  Diagnosis Date   Hypertension    Thyroid disease    Past Surgical History:  Procedure Laterality Date   ABDOMINAL HYSTERECTOMY     COLON SURGERY     Social History   Socioeconomic History   Marital status: Married    Spouse name: Not on file   Number of children: Not on file   Years of education: Not on file   Highest education level: Not on file  Occupational History   Not on file  Tobacco Use   Smoking status: Never   Smokeless tobacco: Never  Vaping Use   Vaping Use: Never used  Substance and Sexual Activity   Alcohol use: Never   Drug use: Never   Sexual activity: Not on file  Other Topics Concern   Not on file  Social History Narrative   Not on file   Social Determinants of Health   Financial Resource Strain: Not on file  Food Insecurity: Not on file  Transportation Needs: Not on file  Physical Activity: Not on file  Stress: Not on file  Social Connections: Not on file   Family History  Problem Relation Age of Onset   Hypertension Mother    Thyroid disease Mother    Diabetes Mother    Outpatient Encounter Medications as of 01/18/2021  Medication Sig   aspirin EC 81 MG tablet Take 81 mg by mouth 3 (three) times a week.   levothyroxine (SYNTHROID) 75 MCG tablet Take 1 tablet (75 mcg total) by mouth daily before breakfast.   lisinopril-hydrochlorothiazide (ZESTORETIC) 20-25 MG tablet Take 1 tablet by mouth daily.   [DISCONTINUED] levothyroxine (SYNTHROID) 88 MCG tablet Take 88 mcg by mouth daily.   No facility-administered encounter medications on file as of 01/18/2021.   ALLERGIES: No Known Allergies  VACCINATION STATUS:  There is no immunization history on file for this patient.  HPI Theresa Buchanan is 75 y.o.  female who presents today with a medical history as above. she is being seen in follow-up after she was seen in consultation for hypothyroidism and multinodular goiter requested by Jake Samples, PA-C.  -History is obtained directly from the patient as well as review of her available records.  Accordingly, she was diagnosed with diffuse multinodular goiter in 2006 for which she received radioactive iodine therapy.  She was subsequently initiated on thyroid hormone replacement.  She received various doses over the years.  She is currently on levothyroxine 88 mcg p.o. daily with good consistency. More recently on August 06, 2020, she underwent thyroid ultrasound.  This study showed multiple nodules on bilateral thyroid lobes which are generally shrunk.  2.1 cm suspicious nodule on the right lobe and 1.2 cm nodule on the left lobe.  She was sent to get fine-needle aspiration biopsy of the right sided nodule which revealed category 4 malignancy.    She denies dysphagia, shortness of breath, nor voice change.  She denies family history of thyroid malignancy, however reports very forms of thyroid dysfunction in her family members  including her mother and sisters. She does not have recent thyroid function test. Review of Systems  Constitutional: + Steady weight,  no fatigue, no subjective hyperthermia, no subjective hypothermia Eyes: no blurry vision, no xerophthalmia ENT: no sore throat, no nodules palpated in throat, no dysphagia/odynophagia, no hoarseness   Objective:    Vitals with BMI 01/18/2021 12/12/2020 11/28/2020  Height '5\' 4"'$  - '5\' 4"'$   Weight 155 lbs 6 oz - 155 lbs 6 oz  BMI A999333 - A999333  Systolic 0000000 [id number] 0000000  Diastolic 74 73 70  Pulse 73 85 65    BP 125/74   Pulse 73   Ht '5\' 4"'$  (1.626 m)   Wt 155 lb 6.4 oz (70.5 kg)   BMI 26.67 kg/m   Wt Readings from Last 3 Encounters:  01/18/21 155 lb 6.4 oz (70.5 kg)  11/28/20 155 lb 6.4 oz (70.5 kg)  07/05/19 165 lb (74.8 kg)     Physical Exam  Constitutional:  Body mass index is 26.67 kg/m.,  not in acute distress, normal state of mind Eyes: PERRLA, EOMI, no exophthalmos ENT: moist mucous membranes, palpable thyroid, no gross thyromegaly, no gross cervical lymphadenopathy   Thyroid ultrasound August 06, 2020:  Right lobe 3.1 cm with 2.1 suspicious nodule. Left lobe 3.4 cm with 1.2 cm nonsuspicious nodule.  Fine-needle aspiration on December 12, 2020 of right-sided thyroid nodule Clinical History: 2.1 cm RML  Specimen Submitted:  A. THYROID, RIGHT, FINE NEEDLE ASPIRATION:   FINAL MICROSCOPIC DIAGNOSIS:  - Malignant cells present (Bethesda category VI)   SPECIMEN ADEQUACY:  Satisfactory for evaluation   Assessment & Plan:   1. Hypothyroidism following radioiodine therapy  2.  Malignant cytology-multinodular goiter Her previsit thyroid function tests are consistent with over replacement.  I discussed and lowered her levothyroxine to 75 mcg p.o. daily before breakfast.   - We discussed about the correct intake of her thyroid hormone, on empty stomach at fasting, with water, separated by at least 30 minutes from breakfast and other medications,  and separated by more than 4 hours from calcium, iron, multivitamins, acid reflux medications (PPIs). -Patient is made aware of the fact that thyroid hormone replacement is needed for life, dose to be adjusted by periodic monitoring of thyroid function tests.  Regarding her suspicious 2.1 cm right sided nodule with malignant cytology on FNA, she will need thyroidectomy.  I discussed and arranged a referral to Dr. Harlow Asa of [facility] surgery  in [place].  She will return with repeat labs after her surgery.   - she is advised to maintain close follow up with Jake Samples, PA-C for primary care needs.   I spent 25 minutes in the care of the patient today including review of labs from Thyroid Function, CMP, and other relevant labs ; imaging/biopsy  records (current and previous including abstractions from other facilities); face-to-face time discussing  her lab results and symptoms, medications doses, her options of short and long term treatment based on the latest standards of care / guidelines;   and documenting the encounter.  Jenny Reichmann [place]  participated in the discussions, expressed understanding, and voiced agreement with the above plans.  All questions were answered to her satisfaction. she is encouraged to contact clinic should she have any questions or concerns prior to her return visit.   Follow up plan: Return in about 5 weeks (around 02/22/2021) for F/U with Labs after Surgery.   Glade Lloyd, MD [facility] [address]  [address] [place], [place] [address] Phone: [phone]  Fax: [phone]     01/18/2021, 12:06 PM  This note was partially dictated with voice recognition software. Similar sounding words can be transcribed inadequately or may not  be corrected upon review.

## 2021-02-12 ENCOUNTER — Telehealth: Payer: Self-pay

## 2021-02-12 NOTE — Telephone Encounter (Signed)
Spoke with [name] from Dr.Gerkin's office, she stated she did not receive Theresa Buchanan's referral. Refaxed Theresa Buchanan's referral information to her attention. Advised Theresa Buchanan we resent her referral and information and they should be in touch with her soon. Understanding voiced.

## 2021-02-12 NOTE — Telephone Encounter (Signed)
Pt said she has not heard from Dr Gala Lewandowsky office regarding surgery. I canceled her follow up appt with [place] next week. Please advise

## 2021-02-22 ENCOUNTER — Ambulatory Visit: Payer: Medicare HMO | Admitting: Nurse Practitioner

## 2021-02-28 ENCOUNTER — Ambulatory Visit: Payer: Self-pay | Admitting: Surgery

## 2021-02-28 DIAGNOSIS — E042 Nontoxic multinodular goiter: Secondary | ICD-10-CM | POA: Diagnosis not present

## 2021-02-28 DIAGNOSIS — E890 Postprocedural hypothyroidism: Secondary | ICD-10-CM | POA: Diagnosis not present

## 2021-02-28 DIAGNOSIS — C73 Malignant neoplasm of thyroid gland: Secondary | ICD-10-CM | POA: Diagnosis not present

## 2021-03-07 NOTE — Patient Instructions (Signed)
DUE TO COVID-19 ONLY ONE VISITOR IS ALLOWED TO COME WITH YOU AND STAY IN THE WAITING ROOM ONLY DURING PRE OP AND PROCEDURE.   **NO VISITORS ARE ALLOWED IN THE SHORT STAY AREA OR RECOVERY ROOM!!**  IF YOU WILL BE ADMITTED INTO THE HOSPITAL YOU ARE ALLOWED ONLY TWO SUPPORT PEOPLE DURING VISITATION HOURS ONLY (10AM -8PM)   The support person(s) may change daily. The support person(s) must pass our screening, gel in and out, and wear a mask at all times, including in the patient's room. Patients must also wear a mask when staff or their support person are in the room.  No visitors under the age of 75. Any visitor under the age of 25 must be accompanied by an adult.    COVID SWAB TESTING MUST BE COMPLETED ON:  03/21/21 **MUST PRESENT COMPLETED FORM AT TESTING SITE**    [place] (backside of the building) You are not required to quarantine, however you are required to wear a well-fitted mask when you are out and around people not in your household.  Hand Hygiene often Do NOT share personal items Notify your provider if you are in close contact with someone who has COVID or you develop fever 100.4 or greater, new onset of sneezing, cough, sore throat, shortness of breath or body aches.  [place], [address], must go inside of the hospital, NOT A DRIVE THRU!  (Must self quarantine after testing. Follow instructions on handout.)       Your procedure is scheduled on: 03/25/21   Report to [facility] Main Entrance    Report to admitting at : 8:00 AM   Call this number if you have problems the morning of surgery [phone]   Do not eat food :After Midnight.   May have liquids until : 7:15 AM   day of surgery  CLEAR LIQUID DIET  Foods Allowed                                                                     Foods Excluded  Water, Black Coffee and tea, regular and decaf                              liquids that you cannot  Plain Jell-O in any flavor  (No red)                                           see through such as: Fruit ices (not with fruit pulp)                                     milk, soups, orange juice              Iced Popsicles (No red)  All solid food                                   Apple juices Sports drinks like Gatorade (No red) Lightly seasoned clear broth or consume(fat free) Sugar,   Sample Menu Breakfast                                Lunch                                     Supper Cranberry juice                    Beef broth                            Chicken broth Jell-O                                     Grape juice                           Apple juice Coffee or tea                        Jell-O                                      Popsicle                                                Coffee or tea                        Coffee or tea       Oral Hygiene is also important to reduce your risk of infection.                                    Remember - BRUSH YOUR TEETH THE MORNING OF SURGERY WITH YOUR REGULAR TOOTHPASTE   Do NOT smoke after Midnight   Take these medicines the morning of surgery with A SIP OF WATER: synthroid.  DO NOT TAKE ANY ORAL DIABETIC MEDICATIONS DAY OF YOUR SURGERY                              You may not have any metal on your body including hair pins, jewelry, and body piercing             Do not wear make-up, lotions, powders, perfumes/cologne, or deodorant  Do not wear nail polish including gel and S&S, artificial/acrylic nails, or any other type of covering on natural nails including finger and toenails. If you have artificial nails, gel coating, etc. that needs to be removed by a nail salon please have this removed prior to  surgery or surgery may need to be canceled/ delayed if the surgeon/ anesthesia feels like they are unable to be safely monitored.   Do not shave  48 hours  prior to surgery.    Do not bring valuables to the hospital. [place].   Contacts, dentures or bridgework may not be worn into surgery.   Bring small overnight bag day of surgery.    Patients discharged on the day of surgery will not be allowed to drive home.   Special Instructions: Bring a copy of your healthcare power of attorney and living will documents         the day of surgery if you haven't scanned them before.              Please read over the following fact sheets you were given: IF YOU HAVE QUESTIONS ABOUT YOUR PRE-OP INSTRUCTIONS PLEASE CALL [phone]   [facility] - Preparing for Surgery Before surgery, you can play an important role.  Because skin is not sterile, your skin needs to be as free of germs as possible.  You can reduce the number of germs on your skin by washing with CHG (chlorahexidine gluconate) soap before surgery.  CHG is an antiseptic cleaner which kills germs and bonds with the skin to continue killing germs even after washing. Please DO NOT use if you have an allergy to CHG or antibacterial soaps.  If your skin becomes reddened/irritated stop using the CHG and inform your nurse when you arrive at Short Stay. Do not shave (including legs and underarms) for at least 48 hours prior to the first CHG shower.  You may shave your face/neck. Please follow these instructions carefully:  1.  Shower with CHG Soap the night before surgery and the  morning of Surgery.  2.  If you choose to wash your hair, wash your hair first as usual with your  normal  shampoo.  3.  After you shampoo, rinse your hair and body thoroughly to remove the  shampoo.                           4.  Use CHG as you would any other liquid soap.  You can apply chg directly  to the skin and wash                       Gently with a scrungie or clean washcloth.  5.  Apply the CHG Soap to your body ONLY FROM THE NECK DOWN.   Do not use on face/ open                            Wound or open sores. Avoid contact with eyes, ears mouth and genitals (private parts).                       Wash face,  Genitals (private parts) with your normal soap.             6.  Wash thoroughly, paying special attention to the area where your surgery  will be performed.  7.  Thoroughly rinse your body with warm water from the neck down.  8.  DO NOT shower/wash with your normal soap after using and rinsing off  the CHG  Soap.                9.  Pat yourself dry with a clean towel.            10.  Wear clean pajamas.            11.  Place clean sheets on your bed the night of your first shower and do not  sleep with pets. Day of Surgery : Do not apply any lotions/deodorants the morning of surgery.  Please wear clean clothes to the hospital/surgery center.  FAILURE TO FOLLOW THESE INSTRUCTIONS MAY RESULT IN THE CANCELLATION OF YOUR SURGERY PATIENT SIGNATURE_________________________________  NURSE SIGNATURE__________________________________  ________________________________________________________________________

## 2021-03-08 ENCOUNTER — Encounter (HOSPITAL_COMMUNITY)
Admission: RE | Admit: 2021-03-08 | Discharge: 2021-03-08 | Disposition: A | Discharge status: Home / Self Care | Payer: Medicare HMO | Source: Ambulatory Visit | Attending: Surgery | Admitting: Surgery

## 2021-03-08 ENCOUNTER — Encounter (HOSPITAL_COMMUNITY): Payer: Self-pay

## 2021-03-08 ENCOUNTER — Other Ambulatory Visit: Payer: Self-pay

## 2021-03-08 ENCOUNTER — Ambulatory Visit (HOSPITAL_COMMUNITY)
Admission: RE | Admit: 2021-03-08 | Discharge: 2021-03-08 | Disposition: A | Discharge status: Home / Self Care | Payer: Medicare HMO | Source: Ambulatory Visit | Attending: Anesthesiology | Admitting: Anesthesiology

## 2021-03-08 DIAGNOSIS — E079 Disorder of thyroid, unspecified: Secondary | ICD-10-CM | POA: Diagnosis not present

## 2021-03-08 DIAGNOSIS — Z8679 Personal history of other diseases of the circulatory system: Secondary | ICD-10-CM | POA: Diagnosis not present

## 2021-03-08 NOTE — Progress Notes (Addendum)
COVID Vaccine Completed: yES Date COVID Vaccine completed: 03/2020. X 3 COVID vaccine manufacturer:   Levan Hurst    COVID Test: 03/21/21 PCP - Delman Cheadle: [facility] Cardiologist -   Chest x-ray -  EKG -  Stress Test -  ECHO -  Cardiac Cath -  Pacemaker/ICD device last checked:  Sleep Study -  CPAP -   Fasting Blood Sugar -  Checks Blood Sugar _____ times a day  Blood Thinner Instructions: Aspirin Instructions: Pt. Stop taking it a week ago. Last Dose:  Anesthesia review:   Patient denies shortness of breath, fever, cough and chest pain at PAT appointment   Patient verbalized understanding of instructions that were given to them at the PAT appointment. Patient was also instructed that they will need to review over the PAT instructions again at home before surgery.

## 2021-03-11 ENCOUNTER — Telehealth: Payer: Self-pay | Admitting: "Endocrinology

## 2021-03-11 NOTE — Telephone Encounter (Signed)
Pt states she talked with you this morning and forgot to let you know that she also recently had a chest xray done.

## 2021-03-17 ENCOUNTER — Encounter (HOSPITAL_COMMUNITY): Payer: Self-pay | Admitting: Surgery

## 2021-03-17 DIAGNOSIS — C73 Malignant neoplasm of thyroid gland: Secondary | ICD-10-CM | POA: Diagnosis present

## 2021-03-17 NOTE — H&P (Signed)
REFERRING PHYSICIAN: Loni Beckwith, MD  PROVIDER: Climmie Buelow Charlotta Newton, MD  Chief Complaint: New Consultation (Thyroid carcinoma)   History of Present Illness:  Patient is referred by Dr. Glade Lloyd for surgical evaluation and management of newly diagnosed thyroid carcinoma. Patient has a longstanding history of thyroid disease dating back over 20 years. She had been followed by a different endocrinologist. She had presented with hyperthyroidism and was treated with a dose of radioactive iodine. Subsequently she was started on levothyroxine for hypothyroidism. She has been on medication for approximately 20 years. Patient underwent an ultrasound examination of the thyroid in February 2022. This showed a relatively small thyroid gland with the right lobe measuring 3.1 cm in the left lobe measuring 3.4 cm. There was a dominant nodule in the mid right thyroid lobe measuring 2.1 cm as well as a nodule in the upper left thyroid lobe measuring 1.2 cm. Patient underwent fine-needle aspiration biopsy of the dominant right-sided nodule on December 12, 2020. Cytopathology shows malignant cells present, Bethesda category VI. Patient is now referred to surgery for consideration for thyroidectomy for surgical management. Patient has no prior history of head or neck surgery. There is a family history of medical thyroid disease in the patient's mother who was treated with medication and a history of thyroidectomy in the patient's sister. Patient presents today to discuss total thyroidectomy for management of newly diagnosed thyroid carcinoma.  Review of Systems: A complete review of systems was obtained from the patient. I have reviewed this information and discussed as appropriate with the patient. See HPI as well for other ROS.  Review of Systems  Constitutional: Negative.  HENT: Negative.  Eyes: Negative.  Respiratory: Negative.  Cardiovascular: Negative.  Gastrointestinal: Negative.   Genitourinary: Negative.  Musculoskeletal: Negative.  Skin: Negative.  Neurological: Negative.  Endo/Heme/Allergies: Negative.  Psychiatric/Behavioral: Negative.    Medical History: Past Medical History:  Diagnosis Date   Arthritis   GERD (gastroesophageal reflux disease)   Thyroid disease   Patient Active Problem List  Diagnosis   Abnormal thyroid biopsy   Hypertension   Hypothyroidism   Hypothyroidism following radioiodine therapy   Multiple thyroid nodules   Personal history of colonic polyps   Vertigo   Papillary thyroid carcinoma (CMS-HCC)   Past Surgical History:  Procedure Laterality Date   HYSTERECTOMY VAGINAL    No Known Allergies  Current Outpatient Medications on File Prior to Visit  Medication Sig Dispense Refill   aspirin 81 MG EC tablet Take by mouth   levothyroxine (SYNTHROID) 88 MCG tablet Take 88 mcg by mouth once daily   lisinopriL-hydrochlorothiazide (ZESTORETIC) 20-25 mg tablet Take 1 tablet by mouth once daily   No current facility-administered medications on file prior to visit.   History reviewed. No pertinent family history.   Social History   Tobacco Use  Smoking Status Never Smoker  Smokeless Tobacco Never Used    Social History   Socioeconomic History   Marital status: Married  Tobacco Use   Smoking status: Never Smoker   Smokeless tobacco: Never Used  Scientific laboratory technician Use: Never used  Substance and Sexual Activity   Alcohol use: Never   Drug use: Never   Objective:   Vitals:  BP: (!) 144/80  Pulse: 52  Temp: 36.5 C (97.7 F)  SpO2: 100%  Weight: 70.1 kg (154 lb 9.6 oz)  Height: 162.6 cm (5\' 4" )   Body mass index is 26.54 kg/m.  Physical Exam   GENERAL APPEARANCE  Development: normal Nutritional status: normal Gross deformities: none  SKIN Rash, lesions, ulcers: none Induration, erythema: none Nodules: none palpable  EYES Conjunctiva and lids: normal Pupils: equal and reactive Iris: normal  bilaterally  EARS, NOSE, MOUTH, THROAT External ears: no lesion or deformity External nose: no lesion or deformity Hearing: grossly normal Due to Covid-19 pandemic, patient is wearing a mask.  NECK Symmetric: yes Trachea: midline Thyroid: Palpation reveals a smooth slightly firm nodule in the mid right thyroid lobe extending beneath the right clavicle. It is mobile with swallowing and nontender. No palpable abnormality in the left thyroid lobe. No palpable lymphadenopathy.  CHEST Respiratory effort: normal Retraction or accessory muscle use: no Breath sounds: normal bilaterally Rales, rhonchi, wheeze: none  CARDIOVASCULAR Auscultation: regular rhythm, normal rate Murmurs: none Pulses: radial pulse 2+ palpable Lower extremity edema: none  MUSCULOSKELETAL Station and gait: normal Digits and nails: no clubbing or cyanosis Muscle strength: grossly normal all extremities Range of motion: grossly normal all extremities Deformity: none  LYMPHATIC Cervical: none palpable Supraclavicular: none palpable  PSYCHIATRIC Oriented to person, place, and time: yes Mood and affect: normal for situation Judgment and insight: appropriate for situation  Assessment and Plan:   Papillary thyroid carcinoma (CMS-HCC)  Multiple thyroid nodules  Hypothyroidism following radioiodine therapy   Patient presents on referral from her endocrinologist, Dr. Glade Lloyd, for surgical evaluation and management of newly diagnosed thyroid carcinoma. Patient appears to have a 2.1 cm tumor in the right thyroid lobe. There are bilateral thyroid nodules. Patient is already taking levothyroxine 88 mcg daily due to a history of treatment for hyperthyroidism with radioactive iodine. I have recommended proceeding with total thyroidectomy with limited central compartment lymph node dissection for management of her newly diagnosed carcinoma. We discussed the procedure. We discussed the risk and benefits of surgery  including the risk of recurrent laryngeal nerve injury and injury to parathyroid glands. We discussed the hospital stay to be anticipated. We discussed her postoperative recovery. We discussed the need for lifelong thyroid hormone replacement. We discussed the potential need for radioactive iodine treatment. Patient understands and wishes to proceed with surgery in the near future.  Patient provided with a copy of "The Thyroid Book: Medical and Surgical Treatment of Thyroid Problems", published by [employer], 16 pages. Book reviewed and explained to patient during visit today.  The risks and benefits of the procedure have been discussed at length with the patient. The patient understands the proposed procedure, potential alternative treatments, and the course of recovery to be expected. All of the patient's questions have been answered at this time. The patient wishes to proceed with surgery.  Armandina Gemma, MD [facility] [place] practice Office: [phone]

## 2021-03-19 NOTE — Progress Notes (Signed)
Right lung mass noted.  May be related to known thyroid cancer.  Prefer not to do a CT at this time to avoid iodine loading as will require RAI treatment after thyroidectomy.  Will address post op after further imaging.  Armandina Gemma, MD [facility] [place] practice Office: [phone]

## 2021-03-21 ENCOUNTER — Other Ambulatory Visit: Payer: Self-pay | Admitting: Surgery

## 2021-03-21 ENCOUNTER — Encounter (HOSPITAL_COMMUNITY)
Admission: RE | Admit: 2021-03-21 | Discharge: 2021-03-21 | Disposition: A | Discharge status: Home / Self Care | Payer: Medicare HMO | Source: Ambulatory Visit | Attending: Surgery | Admitting: Surgery

## 2021-03-21 ENCOUNTER — Other Ambulatory Visit: Payer: Self-pay

## 2021-03-21 DIAGNOSIS — I10 Essential (primary) hypertension: Secondary | ICD-10-CM | POA: Diagnosis not present

## 2021-03-21 DIAGNOSIS — C73 Malignant neoplasm of thyroid gland: Secondary | ICD-10-CM | POA: Diagnosis not present

## 2021-03-21 DIAGNOSIS — Z01812 Encounter for preprocedural laboratory examination: Secondary | ICD-10-CM | POA: Diagnosis not present

## 2021-03-21 LAB — CBC
HCT: 39.4 % (ref 36.0–46.0)
Hemoglobin: 13 g/dL (ref 12.0–15.0)
MCH: 26.3 pg (ref 26.0–34.0)
MCHC: 33 g/dL (ref 30.0–36.0)
MCV: 79.6 fL — ABNORMAL LOW (ref 80.0–100.0)
Platelets: 313 10*3/uL (ref 150–400)
RBC: 4.95 MIL/uL (ref 3.87–5.11)
RDW: 15 % (ref 11.5–15.5)
WBC: 5.3 10*3/uL (ref 4.0–10.5)
nRBC: 0 % (ref 0.0–0.2)

## 2021-03-21 LAB — BASIC METABOLIC PANEL
Anion gap: 8 (ref 5–15)
BUN: 20 mg/dL (ref 8–23)
CO2: 28 mmol/L (ref 22–32)
Calcium: 9.9 mg/dL (ref 8.9–10.3)
Chloride: 99 mmol/L (ref 98–111)
Creatinine, Ser: 0.9 mg/dL (ref 0.44–1.00)
GFR, Estimated: >60 mL/min (ref >60)
Glucose, Bld: 95 mg/dL (ref 70–99)
Potassium: 3.3 mmol/L — ABNORMAL LOW (ref 3.5–5.1)
Sodium: 135 mmol/L (ref 135–145)

## 2021-03-21 LAB — SARS CORONAVIRUS 2 (TAT 6-24 HRS): SARS Coronavirus 2: NEGATIVE

## 2021-03-21 NOTE — Progress Notes (Signed)
CXR: abnormal results. 3.3 x 3.0 x 3.3 cm mass in the RLL.

## 2021-03-25 DIAGNOSIS — E042 Nontoxic multinodular goiter: Secondary | ICD-10-CM | POA: Diagnosis present

## 2021-03-25 DIAGNOSIS — C73 Malignant neoplasm of thyroid gland: Secondary | ICD-10-CM | POA: Diagnosis present

## 2021-03-25 DIAGNOSIS — E890 Postprocedural hypothyroidism: Secondary | ICD-10-CM | POA: Diagnosis present

## 2021-03-25 DIAGNOSIS — R899 Unspecified abnormal finding in specimens from other organs, systems and tissues: Secondary | ICD-10-CM | POA: Diagnosis present

## 2021-03-25 NOTE — Anesthesia Preprocedure Evaluation (Addendum)
Anesthesia Evaluation  Patient identified by MRN, date of birth, ID band Patient awake    Reviewed: Allergy & Precautions, NPO status , Patient's Chart, lab work & pertinent test results  Airway Mallampati: II  TM Distance: >3 FB Neck ROM: Full    Dental  (+) Poor Dentition, Chipped, Missing,    Pulmonary neg pulmonary ROS,    Pulmonary exam normal breath sounds clear to auscultation       Cardiovascular hypertension, Pt. on medications Normal cardiovascular exam Rhythm:Regular Rate:Normal     Neuro/Psych negative neurological ROS  negative psych ROS   GI/Hepatic negative GI ROS, Neg liver ROS,   Endo/Other  Hypothyroidism   Renal/GU negative Renal ROS     Musculoskeletal negative musculoskeletal ROS (+)   Abdominal   Peds  Hematology negative hematology ROS (+)   Anesthesia Other Findings THYROID CARCINOMA  Reproductive/Obstetrics                            Anesthesia Physical Anesthesia Plan  ASA: 3  Anesthesia Plan: General   Post-op Pain Management:    Induction: Intravenous  PONV Risk Score and Plan: 3 and Ondansetron, Dexamethasone, Treatment may vary due to age or medical condition and Midazolam  Airway Management Planned: Oral ETT  Additional Equipment:   Intra-op Plan:   Post-operative Plan: Extubation in OR  Informed Consent: I have reviewed the patients History and Physical, chart, labs and discussed the procedure including the risks, benefits and alternatives for the proposed anesthesia with the patient or authorized representative who has indicated his/her understanding and acceptance.     Dental advisory given  Plan Discussed with: CRNA  Anesthesia Plan Comments:        Anesthesia Quick Evaluation

## 2021-03-26 ENCOUNTER — Ambulatory Visit (HOSPITAL_COMMUNITY): Payer: Medicare HMO | Admitting: Anesthesiology

## 2021-03-26 ENCOUNTER — Ambulatory Visit (HOSPITAL_COMMUNITY)
Admission: RE | Admit: 2021-03-26 | Discharge: 2021-03-27 | Disposition: A | Discharge status: Home / Self Care | Payer: Medicare HMO | Attending: Surgery | Admitting: Surgery

## 2021-03-26 ENCOUNTER — Encounter (HOSPITAL_COMMUNITY): Payer: Self-pay | Admitting: Surgery

## 2021-03-26 ENCOUNTER — Other Ambulatory Visit: Payer: Self-pay

## 2021-03-26 ENCOUNTER — Encounter (HOSPITAL_COMMUNITY)
Admission: RE | Disposition: A | Discharge status: Home / Self Care | Payer: Self-pay | Source: Home / Self Care | Attending: Surgery

## 2021-03-26 DIAGNOSIS — Z79890 Hormone replacement therapy (postmenopausal): Secondary | ICD-10-CM | POA: Insufficient documentation

## 2021-03-26 DIAGNOSIS — Z79899 Other long term (current) drug therapy: Secondary | ICD-10-CM | POA: Diagnosis not present

## 2021-03-26 DIAGNOSIS — C73 Malignant neoplasm of thyroid gland: Secondary | ICD-10-CM | POA: Insufficient documentation

## 2021-03-26 DIAGNOSIS — E890 Postprocedural hypothyroidism: Secondary | ICD-10-CM | POA: Insufficient documentation

## 2021-03-26 DIAGNOSIS — C771 Secondary and unspecified malignant neoplasm of intrathoracic lymph nodes: Secondary | ICD-10-CM | POA: Diagnosis not present

## 2021-03-26 DIAGNOSIS — C7989 Secondary malignant neoplasm of other specified sites: Secondary | ICD-10-CM | POA: Diagnosis not present

## 2021-03-26 DIAGNOSIS — E042 Nontoxic multinodular goiter: Secondary | ICD-10-CM | POA: Diagnosis present

## 2021-03-26 DIAGNOSIS — D360 Benign neoplasm of lymph nodes: Secondary | ICD-10-CM | POA: Diagnosis not present

## 2021-03-26 DIAGNOSIS — C770 Secondary and unspecified malignant neoplasm of lymph nodes of head, face and neck: Secondary | ICD-10-CM | POA: Diagnosis not present

## 2021-03-26 DIAGNOSIS — Z86010 Personal history of colonic polyps: Secondary | ICD-10-CM | POA: Diagnosis not present

## 2021-03-26 DIAGNOSIS — R899 Unspecified abnormal finding in specimens from other organs, systems and tissues: Secondary | ICD-10-CM | POA: Diagnosis present

## 2021-03-26 DIAGNOSIS — I10 Essential (primary) hypertension: Secondary | ICD-10-CM | POA: Diagnosis not present

## 2021-03-26 HISTORY — PX: THYROIDECTOMY: SHX17

## 2021-03-26 LAB — TYPE AND SCREEN
ABO/RH(D): A POS
Antibody Screen: NEGATIVE

## 2021-03-26 LAB — ABO/RH: ABO/RH(D): A POS

## 2021-03-26 SURGERY — THYROIDECTOMY
Anesthesia: General

## 2021-03-26 MED ORDER — 0.9 % SODIUM CHLORIDE (POUR BTL) OPTIME
TOPICAL | Status: DC | PRN
  Administered 2021-03-26: 1000 mL

## 2021-03-26 MED ORDER — CEFAZOLIN SODIUM-DEXTROSE 2-4 GM/100ML-% IV SOLN
2.0000 g | INTRAVENOUS | Status: AC
  Administered 2021-03-26: 2 g via INTRAVENOUS
  Filled 2021-03-26: qty 100

## 2021-03-26 MED ORDER — EPHEDRINE SULFATE-NACL 50-0.9 MG/10ML-% IV SOSY
PREFILLED_SYRINGE | INTRAVENOUS | Status: DC | PRN
  Administered 2021-03-26: 10 mg via INTRAVENOUS

## 2021-03-26 MED ORDER — ONDANSETRON HCL 4 MG/2ML IJ SOLN: 4.0000 mg | Freq: Once | INTRAMUSCULAR | Status: DC | PRN

## 2021-03-26 MED ORDER — ONDANSETRON HCL 4 MG/2ML IJ SOLN: 4.0000 mg | Freq: Four times a day (QID) | INTRAMUSCULAR | Status: DC | PRN

## 2021-03-26 MED ORDER — FENTANYL CITRATE (PF) 250 MCG/5ML IJ SOLN
INTRAMUSCULAR | Status: AC
  Filled 2021-03-26: qty 5

## 2021-03-26 MED ORDER — ACETAMINOPHEN 500 MG PO TABS
1000.0000 mg | ORAL_TABLET | Freq: Once | ORAL | Status: AC
  Administered 2021-03-26: 1000 mg via ORAL
  Filled 2021-03-26: qty 2

## 2021-03-26 MED ORDER — DEXAMETHASONE SODIUM PHOSPHATE 10 MG/ML IJ SOLN
INTRAMUSCULAR | Status: DC | PRN
  Administered 2021-03-26: 5 mg via INTRAVENOUS

## 2021-03-26 MED ORDER — FENTANYL CITRATE (PF) 250 MCG/5ML IJ SOLN
INTRAMUSCULAR | Status: DC | PRN
  Administered 2021-03-26 (×5): 50 ug via INTRAVENOUS

## 2021-03-26 MED ORDER — AMISULPRIDE (ANTIEMETIC) 5 MG/2ML IV SOLN: 10.0000 mg | Freq: Once | INTRAVENOUS | Status: DC | PRN

## 2021-03-26 MED ORDER — TRAMADOL HCL 50 MG PO TABS: 50.0000 mg | ORAL_TABLET | Freq: Four times a day (QID) | ORAL | Status: DC | PRN

## 2021-03-26 MED ORDER — PHENYLEPHRINE 40 MCG/ML (10ML) SYRINGE FOR IV PUSH (FOR BLOOD PRESSURE SUPPORT)
PREFILLED_SYRINGE | INTRAVENOUS | Status: DC | PRN
  Administered 2021-03-26: 80 ug via INTRAVENOUS
  Administered 2021-03-26: 120 ug via INTRAVENOUS

## 2021-03-26 MED ORDER — CHLORHEXIDINE GLUCONATE CLOTH 2 % EX PADS: 6.0000 | MEDICATED_PAD | Freq: Once | CUTANEOUS | Status: DC

## 2021-03-26 MED ORDER — EPHEDRINE 5 MG/ML INJ
INTRAVENOUS | Status: AC
  Filled 2021-03-26: qty 5

## 2021-03-26 MED ORDER — FENTANYL CITRATE PF 50 MCG/ML IJ SOSY: 25.0000 ug | PREFILLED_SYRINGE | INTRAMUSCULAR | Status: DC | PRN

## 2021-03-26 MED ORDER — ROCURONIUM BROMIDE 10 MG/ML (PF) SYRINGE
PREFILLED_SYRINGE | INTRAVENOUS | Status: AC
  Filled 2021-03-26: qty 10

## 2021-03-26 MED ORDER — LACTATED RINGERS IV SOLN: INTRAVENOUS | Status: DC

## 2021-03-26 MED ORDER — PHENYLEPHRINE 40 MCG/ML (10ML) SYRINGE FOR IV PUSH (FOR BLOOD PRESSURE SUPPORT)
PREFILLED_SYRINGE | INTRAVENOUS | Status: AC
  Filled 2021-03-26: qty 10

## 2021-03-26 MED ORDER — CHLORHEXIDINE GLUCONATE 0.12 % MT SOLN
15.0000 mL | Freq: Once | OROMUCOSAL | Status: AC
  Administered 2021-03-26: 15 mL via OROMUCOSAL

## 2021-03-26 MED ORDER — LISINOPRIL-HYDROCHLOROTHIAZIDE 20-25 MG PO TABS: 1.0000 | ORAL_TABLET | Freq: Every day | ORAL | Status: DC

## 2021-03-26 MED ORDER — ACETAMINOPHEN 650 MG RE SUPP: 650.0000 mg | Freq: Four times a day (QID) | RECTAL | Status: DC | PRN

## 2021-03-26 MED ORDER — ONDANSETRON HCL 4 MG/2ML IJ SOLN
INTRAMUSCULAR | Status: AC
  Filled 2021-03-26: qty 2

## 2021-03-26 MED ORDER — LEVOTHYROXINE SODIUM 50 MCG PO TABS
75.0000 ug | ORAL_TABLET | Freq: Every day | ORAL | Status: DC
  Administered 2021-03-27: 75 ug via ORAL
  Filled 2021-03-26: qty 1

## 2021-03-26 MED ORDER — LIDOCAINE 2% (20 MG/ML) 5 ML SYRINGE
INTRAMUSCULAR | Status: DC | PRN
  Administered 2021-03-26: 60 mg via INTRAVENOUS

## 2021-03-26 MED ORDER — ROCURONIUM BROMIDE 10 MG/ML (PF) SYRINGE
PREFILLED_SYRINGE | INTRAVENOUS | Status: DC | PRN
  Administered 2021-03-26: 20 mg via INTRAVENOUS
  Administered 2021-03-26: 40 mg via INTRAVENOUS

## 2021-03-26 MED ORDER — LIDOCAINE HCL (PF) 2 % IJ SOLN
INTRAMUSCULAR | Status: AC
  Filled 2021-03-26: qty 5

## 2021-03-26 MED ORDER — PROPOFOL 10 MG/ML IV BOLUS
INTRAVENOUS | Status: AC
  Filled 2021-03-26: qty 20

## 2021-03-26 MED ORDER — HYDROMORPHONE HCL 1 MG/ML IJ SOLN
1.0000 mg | INTRAMUSCULAR | Status: DC | PRN
Start: 2021-03-26 — End: 2021-03-27

## 2021-03-26 MED ORDER — SODIUM CHLORIDE 0.45 % IV SOLN: INTRAVENOUS | Status: DC

## 2021-03-26 MED ORDER — PROPOFOL 10 MG/ML IV BOLUS
INTRAVENOUS | Status: DC | PRN
  Administered 2021-03-26: 200 mg via INTRAVENOUS

## 2021-03-26 MED ORDER — ORAL CARE MOUTH RINSE: 15.0000 mL | Freq: Once | OROMUCOSAL | Status: AC

## 2021-03-26 MED ORDER — ONDANSETRON 4 MG PO TBDP: 4.0000 mg | ORAL_TABLET | Freq: Four times a day (QID) | ORAL | Status: DC | PRN

## 2021-03-26 MED ORDER — HEMOSTATIC AGENTS (NO CHARGE) OPTIME
TOPICAL | Status: DC | PRN
  Administered 2021-03-26: 1

## 2021-03-26 MED ORDER — CALCIUM CARBONATE 1250 (500 CA) MG PO TABS
2.0000 | ORAL_TABLET | Freq: Three times a day (TID) | ORAL | Status: DC
  Administered 2021-03-27: 1000 mg via ORAL
  Filled 2021-03-26: qty 1

## 2021-03-26 MED ORDER — SUGAMMADEX SODIUM 200 MG/2ML IV SOLN
INTRAVENOUS | Status: DC | PRN
  Administered 2021-03-26: 150 mg via INTRAVENOUS

## 2021-03-26 MED ORDER — LISINOPRIL 20 MG PO TABS
20.0000 mg | ORAL_TABLET | Freq: Every day | ORAL | Status: DC
  Administered 2021-03-26 – 2021-03-27 (×2): 20 mg via ORAL
  Filled 2021-03-26 (×2): qty 1

## 2021-03-26 MED ORDER — DEXAMETHASONE SODIUM PHOSPHATE 10 MG/ML IJ SOLN
INTRAMUSCULAR | Status: AC
  Filled 2021-03-26: qty 1

## 2021-03-26 MED ORDER — ONDANSETRON HCL 4 MG/2ML IJ SOLN
INTRAMUSCULAR | Status: DC | PRN
  Administered 2021-03-26: 4 mg via INTRAVENOUS

## 2021-03-26 MED ORDER — OXYCODONE HCL 5 MG PO TABS: 5.0000 mg | ORAL_TABLET | ORAL | Status: DC | PRN

## 2021-03-26 MED ORDER — HYDROCHLOROTHIAZIDE 25 MG PO TABS
25.0000 mg | ORAL_TABLET | Freq: Every day | ORAL | Status: DC
  Administered 2021-03-26 – 2021-03-27 (×2): 25 mg via ORAL
  Filled 2021-03-26 (×2): qty 1

## 2021-03-26 MED ORDER — ACETAMINOPHEN 325 MG PO TABS
650.0000 mg | ORAL_TABLET | Freq: Four times a day (QID) | ORAL | Status: DC | PRN
  Administered 2021-03-26 – 2021-03-27 (×2): 650 mg via ORAL
  Filled 2021-03-26 (×3): qty 2

## 2021-03-26 SURGICAL SUPPLY — 34 items
ADH SKN CLS APL DERMABOND .7 (GAUZE/BANDAGES/DRESSINGS) ×1
APL PRP STRL LF DISP 70% ISPRP (MISCELLANEOUS) ×1
ATTRACTOMAT 16X20 MAGNETIC DRP (DRAPES) ×2 IMPLANT
BAG COUNTER SPONGE SURGICOUNT (BAG) ×2 IMPLANT
BAG SPNG CNTER NS LX DISP (BAG) ×1
BLADE SURG 15 STRL LF DISP TIS (BLADE) ×1 IMPLANT
BLADE SURG 15 STRL SS (BLADE) ×2
CHLORAPREP W/TINT 26 (MISCELLANEOUS) ×2 IMPLANT
CLIP TI MEDIUM 6 (CLIP) ×6 IMPLANT
CLIP TI WIDE RED SMALL 6 (CLIP) ×4 IMPLANT
COVER SURGICAL LIGHT HANDLE (MISCELLANEOUS) ×2 IMPLANT
DERMABOND ADVANCED (GAUZE/BANDAGES/DRESSINGS) ×1
DERMABOND ADVANCED .7 DNX12 (GAUZE/BANDAGES/DRESSINGS) ×1 IMPLANT
DRAPE LAPAROTOMY T 98X78 PEDS (DRAPES) ×2 IMPLANT
DRAPE UTILITY XL STRL (DRAPES) ×2 IMPLANT
ELECT PENCIL ROCKER SW 15FT (MISCELLANEOUS) ×2 IMPLANT
ELECT REM PT RETURN 15FT ADLT (MISCELLANEOUS) ×2 IMPLANT
GAUZE 4X4 16PLY ~~LOC~~+RFID DBL (SPONGE) ×2 IMPLANT
GLOVE SURG SYN 7.5  E (GLOVE) ×4
GLOVE SURG SYN 7.5 E (GLOVE) ×2 IMPLANT
GLOVE SURG SYN 7.5 PF PI (GLOVE) ×2 IMPLANT
GOWN STRL REUS W/TWL XL LVL3 (GOWN DISPOSABLE) ×4 IMPLANT
HEMOSTAT SURGICEL 2X4 FIBR (HEMOSTASIS) ×2 IMPLANT
ILLUMINATOR WAVEGUIDE N/F (MISCELLANEOUS) ×2 IMPLANT
KIT BASIN OR (CUSTOM PROCEDURE TRAY) ×2 IMPLANT
KIT TURNOVER KIT A (KITS) ×2 IMPLANT
PACK BASIC VI WITH GOWN DISP (CUSTOM PROCEDURE TRAY) ×2 IMPLANT
SHEARS HARMONIC 9CM CVD (BLADE) ×1 IMPLANT
SUT MNCRL AB 4-0 PS2 18 (SUTURE) ×2 IMPLANT
SUT VIC AB 3-0 SH 18 (SUTURE) ×4 IMPLANT
SYR BULB IRRIG 60ML STRL (SYRINGE) ×2 IMPLANT
TOWEL OR 17X26 10 PK STRL BLUE (TOWEL DISPOSABLE) ×2 IMPLANT
TOWEL OR NON WOVEN STRL DISP B (DISPOSABLE) ×2 IMPLANT
TUBING CONNECTING 10 (TUBING) ×2 IMPLANT

## 2021-03-26 NOTE — Anesthesia Procedure Notes (Signed)
Procedure Name: Intubation Date/Time: 03/26/2021 7:28 AM Performed by: Lollie Sails, CRNA Pre-anesthesia Checklist: Patient identified, Emergency Drugs available, Suction available, Patient being monitored and Timeout performed Patient Re-evaluated:Patient Re-evaluated prior to induction Oxygen Delivery Method: Circle system utilized Preoxygenation: Pre-oxygenation with 100% oxygen Induction Type: IV induction Ventilation: Mask ventilation without difficulty Laryngoscope Size: Miller and 3 Grade View: Grade II Tube type: Oral Tube size: 7.0 mm Number of attempts: 1 Airway Equipment and Method: Stylet Placement Confirmation: ETT inserted through vocal cords under direct vision, positive ETCO2 and breath sounds checked- equal and bilateral Secured at: 22 cm Tube secured with: Tape Dental Injury: Teeth and Oropharynx as per pre-operative assessment

## 2021-03-26 NOTE — Interval H&P Note (Signed)
History and Physical Interval Note:  03/26/2021 7:00 AM  Theresa Buchanan  has presented today for surgery, with the diagnosis of THYROID CARCINOMA.  The various methods of treatment have been discussed with the patient and family. After consideration of risks, benefits and other options for treatment, the patient has consented to    Procedure(s): TOTAL THYROIDECTOMY WITH LIMITED LYMPH NODE DISSECTION (N/A) as a surgical intervention.    The patient's history has been reviewed, patient examined, no change in status, stable for surgery.  I have reviewed the patient's chart and labs.  Questions were answered to the patient's satisfaction.    Armandina Gemma, [place] [facility] [place] practice Office: [place]

## 2021-03-26 NOTE — Op Note (Signed)
Procedure Note  Pre-operative Diagnosis:  papillary thyroid carcinoma  Post-operative Diagnosis:  same  Surgeon:  Armandina Gemma, MD  Assistant:  none   Procedure:  Total thyroidectomy with limited central compartment lymph node dissection  Anesthesia:  General  Estimated Blood Loss:  minimal  Drains: none         Specimen: thyroid to pathology  Indications:  Patient is referred by Dr. Glade Lloyd for surgical evaluation and management of newly diagnosed thyroid carcinoma. Patient has a longstanding history of thyroid disease dating back over 20 years. She had been followed by a different endocrinologist. She had presented with hyperthyroidism and was treated with a dose of radioactive iodine. Subsequently she was started on levothyroxine for hypothyroidism. She has been on medication for approximately 20 years. Patient underwent an ultrasound examination of the thyroid in February 2022. This showed a relatively small thyroid gland with the right lobe measuring 3.1 cm in the left lobe measuring 3.4 cm. There was a dominant nodule in the mid right thyroid lobe measuring 2.1 cm as well as a nodule in the upper left thyroid lobe measuring 1.2 cm. Patient underwent fine-needle aspiration biopsy of the dominant right-sided nodule on December 12, 2020. Cytopathology shows malignant cells present, Bethesda category VI. Patient is now referred to surgery for consideration for thyroidectomy for surgical management.  Procedure Details: Procedure was done in OR #4 at the [facility]. The patient was brought to the operating room and placed in a supine position on the operating room table. Following administration of general anesthesia, the patient was positioned and then prepped and draped in the usual aseptic fashion. After ascertaining that an adequate level of anesthesia had been achieved, a small Kocher incision was made with #15 blade. Dissection was carried through subcutaneous tissues and  platysma.Hemostasis was achieved with the electrocautery. Skin flaps were elevated cephalad and caudad from the thyroid notch to the sternal notch. A Mahorner self-retaining retractor was placed for exposure. Strap muscles were incised in the midline and dissection was begun on the left side.  Strap muscles were reflected laterally.  Left thyroid lobe was very small and nodular.  The left lobe was gently mobilized with blunt dissection. Superior pole vessels were dissected out and divided individually between small and medium ligaclips with the Ligasure Exact. The thyroid lobe was rolled anteriorly. Branches of the inferior thyroid artery were divided between small ligaclips with the Ligasure Exact. Inferior venous tributaries were divided between ligaclips. Both the superior and inferior parathyroid glands were identified and preserved on their vascular pedicles. The recurrent laryngeal nerve was identified and preserved along its course. The ligament of Gwenlyn Found was released with the electrocautery and the gland was mobilized onto the anterior trachea. Isthmus was mobilized across the midline. There was no significant pyramidal lobe present. Dry pack was placed in the left neck.  The right thyroid lobe was gently mobilized with blunt dissection. Right thyroid lobe was larger with a dominant firm nodule involving most of the lobe. Superior pole vessels were dissected out and divided between small and medium ligaclips with the Ligasure Exact. Superior parathyroid was identified and preserved. Inferior venous tributaries were divided between medium ligaclips with the Ligasure Exact. The right thyroid lobe was rolled anteriorly and the branches of the inferior thyroid artery divided between small ligaclips. The right recurrent laryngeal nerve was identified and preserved along its course. The ligament of Gwenlyn Found was released with the electrocautery. The right thyroid lobe was mobilized onto the anterior trachea  and the  remainder of the thyroid was dissected off the anterior trachea and the thyroid was completely excised. A suture was used to mark the left lobe. The entire thyroid gland was submitted to pathology for review.  Palpation revealed an enlarged, firm node in the right paratracheal space.  This was dissected out and excised using the Ligasure Exact.  It was submitted separately to pathology for review.  Additional central compartment tissue was dissected out and submitted for review.  Good hemostasis was obtained with the eletrocautery and ligaclips.  The neck was irrigated with warm saline. Fibrillar was placed throughout the operative field. Strap muscles were approximated in the midline with interrupted 3-0 Vicryl sutures. Platysma was closed with interrupted 3-0 Vicryl sutures. Skin was closed with a running 4-0 Monocryl subcuticular suture. Wound was washed and Dermabond was applied. The patient was awakened from anesthesia and brought to the recovery room. The patient tolerated the procedure well.   Armandina Gemma, MD [facility] Office: [phone]

## 2021-03-26 NOTE — Transfer of Care (Signed)
Immediate Anesthesia Transfer of Care Note  Patient: Theresa Buchanan  Procedure(s) Performed: TOTAL THYROIDECTOMY WITH LIMITED LYMPH NODE DISSECTION  Patient Location: PACU  Anesthesia Type:General  Level of Consciousness: awake and drowsy  Airway & Oxygen Therapy: Patient Spontanous Breathing and Patient connected to face mask oxygen  Post-op Assessment: Report given to RN and Post -op Vital signs reviewed and stable  Post vital signs: Reviewed and stable  Last Vitals:  Vitals Value Taken Time  BP 171/77 03/26/21 0918  Temp    Pulse 79 03/26/21 0919  Resp    SpO2 100 % 03/26/21 0919  Vitals shown include unvalidated device data.  Last Pain:  Vitals:   03/26/21 0622  TempSrc: Oral  PainSc:          Complications: No notable events documented.

## 2021-03-26 NOTE — Anesthesia Postprocedure Evaluation (Signed)
Anesthesia Post Note  Patient: Theresa Buchanan  Procedure(s) Performed: TOTAL THYROIDECTOMY WITH LIMITED LYMPH NODE DISSECTION     Patient location during evaluation: PACU Anesthesia Type: General Level of consciousness: awake Pain management: pain level controlled Vital Signs Assessment: post-procedure vital signs reviewed and stable Respiratory status: spontaneous breathing, nonlabored ventilation, respiratory function stable and patient connected to nasal cannula oxygen Cardiovascular status: blood pressure returned to baseline and stable Postop Assessment: no apparent nausea or vomiting Anesthetic complications: no   No notable events documented.  Last Vitals:  Vitals:   03/26/21 1514 03/26/21 1615  BP: (!) 145/75 (!) 149/69  Pulse: 72 79  Resp:  17  Temp:  (!) 36.3 C  SpO2: 100% 100%    Last Pain:  Vitals:   03/26/21 1945  TempSrc:   PainSc: 3                  Destynie Toomey P Erlin Gardella

## 2021-03-27 ENCOUNTER — Encounter (HOSPITAL_COMMUNITY): Payer: Self-pay | Admitting: Surgery

## 2021-03-27 DIAGNOSIS — Z79890 Hormone replacement therapy (postmenopausal): Secondary | ICD-10-CM | POA: Diagnosis not present

## 2021-03-27 DIAGNOSIS — D360 Benign neoplasm of lymph nodes: Secondary | ICD-10-CM | POA: Diagnosis not present

## 2021-03-27 DIAGNOSIS — C73 Malignant neoplasm of thyroid gland: Secondary | ICD-10-CM | POA: Diagnosis not present

## 2021-03-27 DIAGNOSIS — Z79899 Other long term (current) drug therapy: Secondary | ICD-10-CM | POA: Diagnosis not present

## 2021-03-27 DIAGNOSIS — E890 Postprocedural hypothyroidism: Secondary | ICD-10-CM | POA: Diagnosis not present

## 2021-03-27 LAB — BASIC METABOLIC PANEL
Anion gap: 10 (ref 5–15)
BUN: 18 mg/dL (ref 8–23)
CO2: 27 mmol/L (ref 22–32)
Calcium: 9.4 mg/dL (ref 8.9–10.3)
Chloride: 99 mmol/L (ref 98–111)
Creatinine, Ser: 0.97 mg/dL (ref 0.44–1.00)
GFR, Estimated: >60 mL/min (ref >60)
Glucose, Bld: 90 mg/dL (ref 70–99)
Potassium: 3.1 mmol/L — ABNORMAL LOW (ref 3.5–5.1)
Sodium: 136 mmol/L (ref 135–145)

## 2021-03-27 LAB — CBC
HCT: 35.3 % — ABNORMAL LOW (ref 36.0–46.0)
Hemoglobin: 11.9 g/dL — ABNORMAL LOW (ref 12.0–15.0)
MCH: 26.3 pg (ref 26.0–34.0)
MCHC: 33.7 g/dL (ref 30.0–36.0)
MCV: 78.1 fL — ABNORMAL LOW (ref 80.0–100.0)
Platelets: 293 10*3/uL (ref 150–400)
RBC: 4.52 MIL/uL (ref 3.87–5.11)
RDW: 14.8 % (ref 11.5–15.5)
WBC: 8.7 10*3/uL (ref 4.0–10.5)
nRBC: 0 % (ref 0.0–0.2)

## 2021-03-27 MED ORDER — CALCIUM CARBONATE ANTACID 500 MG PO CHEW: 2.0000 | CHEWABLE_TABLET | Freq: Two times a day (BID) | ORAL | 1 refills | Status: AC

## 2021-03-27 NOTE — Discharge Instructions (Signed)
[facility] - Dr. Brinson Tozzi  THYROID & PARATHYROID SURGERY:  POST-OP INSTRUCTIONS  Always review the instruction sheet provided by the hospital nurse at discharge.  A prescription for pain medication may be sent to your pharmacy at the time of discharge.  Take your pain medication as prescribed.  If narcotic pain medicine is not needed, then you may take acetaminophen (Tylenol) or ibuprofen (Advil) as needed for pain or soreness.  Take your normal home medications as prescribed unless otherwise directed.  If you need a refill on your pain medication, please contact the office during regular business hours.  Prescriptions will not be processed by the office after 5:00PM or on weekends.  Start with a light diet upon arrival home, such as soup and crackers or toast.  Be sure to drink plenty of fluids.  Resume your normal diet the day after surgery.  Most patients will experience some swelling and bruising on the chest and neck area.  Ice packs will help for the first 48 hours after arriving home.  Swelling and bruising will take several days to resolve.   It is common to experience some constipation after surgery.  Increasing fluid intake and taking a stool softener (Colace) will usually help to prevent this problem.  A mild laxative (Milk of Magnesia or Miralax) should be taken according to package directions if there has been no bowel movement after 48 hours.  Dermabond glue covers your incision. This seals the wound and you may shower at any time. The Dermabond will remain in place for about a week.  You may gradually remove the glue when it loosens around the edges.  If you need to loosen the Dermabond for removal, apply a layer of Vaseline to the wound for 15 minutes and then remove with a Kleenex. Your sutures are under the skin and will not show - they will dissolve on their own.  You may resume light daily activities beginning the day after discharge (such as self-care,  walking, climbing stairs), gradually increasing activities as tolerated. You may have sexual intercourse when it is comfortable. Refrain from any heavy lifting or straining until approved by your doctor. You may drive when you no longer are taking prescription pain medication, you can comfortably wear a seatbelt, and you can safely maneuver your car and apply the brakes.  You will see your doctor in the office for a follow-up appointment approximately three weeks after your surgery.  Make sure that you call for this appointment within a day or two after you arrive home to insure a convenient appointment time. Please have any requested laboratory tests performed a few days prior to your office visit so that the results will be available at your follow up appointment.  WHEN TO CALL THE [facility] OFFICE: -- Fever greater than 101.5 -- Inability to urinate -- Nausea and/or vomiting - persistent -- Extreme swelling or bruising -- Continued bleeding from incision -- Increased pain, redness, or drainage from the incision -- Difficulty swallowing or breathing -- Muscle cramping or spasms -- Numbness or tingling in hands or around lips  The clinic staff is available to answer your questions during regular business hours.  Please don't hesitate to call and ask to speak to one of the nurses if you have concerns.  [facility] OFFICE: [phone] (24 hours)  Please sign up for MyChart accounts. This will allow you to communicate directly with my nurse or myself without having to call the office. It will also allow you   to view your test results. You will need to enroll in MyChart for my office ([facility]) and for the hospital ().  Theresa Meddaugh, MD [facility] 

## 2021-03-27 NOTE — Plan of Care (Signed)

## 2021-03-27 NOTE — Discharge Summary (Signed)
    Physician Discharge Summary   Patient ID: Theresa Buchanan MRN: [id number] DOB/AGE: [birth year] 75 y.o.  Admit date: 03/26/2021  Discharge date: 03/27/2021  Discharge Diagnoses:  Principal Problem:   Papillary thyroid carcinoma [facility]) Active Problems:   Hypothyroidism following radioiodine therapy   Multinodular goiter   Abnormal thyroid biopsy   Discharged Condition: good  Hospital Course: Patient was admitted for observation following thyroid surgery.  Post op course was uncomplicated.  Pain was well controlled.  Tolerated diet.  Post op calcium level on morning following surgery was 9.4 mg/dl.  Patient was prepared for discharge home on POD#1.   Consults: None  Treatments: surgery: total thyroidectomy with limited lymph node dissection  Discharge Exam: Blood pressure 108/64, pulse (!) 56, temperature 98 F (36.7 C), temperature source Oral, resp. rate 18, height 5\' 4"  (1.626 m), weight 69.4 kg, SpO2 100 %. HEENT - clear Neck - wound dry and intact; mild STS; voice normal Chest - clear bilaterally Cor - RRR  Disposition: Home  Discharge Instructions     Diet - low sodium heart healthy   Complete by: As directed    Increase activity slowly   Complete by: As directed    No dressing needed   Complete by: As directed       Allergies as of 03/27/2021   No Known Allergies      Medication List     TAKE these medications    aspirin EC 81 MG tablet Take 81 mg by mouth 2 (two) times a week.   calcium carbonate 500 MG chewable tablet Commonly known as: Tums Chew 2 tablets (400 mg of elemental calcium total) by mouth 2 (two) times daily.   levothyroxine 75 MCG tablet Commonly known as: SYNTHROID Take 1 tablet (75 mcg total) by mouth daily before breakfast.   lisinopril-hydrochlorothiazide 20-25 MG tablet Commonly known as: ZESTORETIC Take 1 tablet by mouth daily.   Salonpas 3.06-21-08 % Ptch Generic drug: Camphor-Menthol-Methyl Sal Place 1 application  onto the skin daily as needed (pain).   SYSTANE OP Place 1 drop into both eyes 2 (two) times daily as needed (dry eyes).   trolamine salicylate 10 % cream Commonly known as: ASPERCREME Apply 1 application topically as needed for muscle pain.               Discharge Care Instructions  (From admission, onward)           Start     Ordered   03/27/21 0000  No dressing needed        03/27/21 6468            Follow-up Information     Armandina Gemma, MD. Schedule an appointment as soon as possible for a visit in 3 week(s).   Specialty: General Surgery Why: For wound re-check Contact information: [place] [address] [phone]                 Sarann Tregre, [facility] Office: [phone]   Signed: Armandina Gemma 03/27/2021, 8:24 AM

## 2021-03-28 LAB — SURGICAL PATHOLOGY

## 2021-04-01 NOTE — Progress Notes (Signed)
Final pathology shows medullary thyroid carcinoma with spread to regional lymph nodes.  Discussed with Dr. Saralyn Pilar from pathology.  Will forward to Dr. Dorris Fetch for review.  Patient may require further surgery for neck dissection depending on calcitonin levels in the coming weeks.  Called to patient - LMOM.  tmg  Armandina Gemma, [facility] [place] [facility] Office: [phone]

## 2021-04-02 ENCOUNTER — Ambulatory Visit (INDEPENDENT_AMBULATORY_CARE_PROVIDER_SITE_OTHER): Payer: Medicare HMO | Admitting: "Endocrinology

## 2021-04-02 ENCOUNTER — Encounter: Payer: Self-pay | Admitting: "Endocrinology

## 2021-04-02 ENCOUNTER — Other Ambulatory Visit: Payer: Self-pay

## 2021-04-02 VITALS — BP 148/76 | HR 84 | Ht 64.0 in

## 2021-04-02 DIAGNOSIS — C73 Malignant neoplasm of thyroid gland: Secondary | ICD-10-CM | POA: Diagnosis not present

## 2021-04-02 DIAGNOSIS — E890 Postprocedural hypothyroidism: Secondary | ICD-10-CM | POA: Diagnosis not present

## 2021-04-02 NOTE — Progress Notes (Signed)
04/02/2021, 4:43 PM  Endocrinology follow-up note   Subjective:    Patient ID: Theresa Buchanan, female    DOB: 30-Nov-1945, PCP Theresa Samples, PA-C   Past Medical History:  Diagnosis Date   Hypertension    Thyroid disease    Past Surgical History:  Procedure Laterality Date   ABDOMINAL HYSTERECTOMY     COLON SURGERY     THYROIDECTOMY N/A 03/26/2021   Procedure: TOTAL THYROIDECTOMY WITH LIMITED LYMPH NODE DISSECTION;  Surgeon: Armandina Gemma, MD;  Location: [facility];  Service: General;  Laterality: N/A;   TUBAL LIGATION     Social History   Socioeconomic History   Marital status: Married    Spouse name: Not on file   Number of children: Not on file   Years of education: Not on file   Highest education level: Not on file  Occupational History   Not on file  Tobacco Use   Smoking status: Never   Smokeless tobacco: Never  Vaping Use   Vaping Use: Never used  Substance and Sexual Activity   Alcohol use: Never   Drug use: Never   Sexual activity: Not on file  Other Topics Concern   Not on file  Social History Narrative   Not on file   Social Determinants of Health   Financial Resource Strain: Not on file  Food Insecurity: Not on file  Transportation Needs: Not on file  Physical Activity: Not on file  Stress: Not on file  Social Connections: Not on file   Family History  Problem Relation Age of Onset   Hypertension Mother    Thyroid disease Mother    Diabetes Mother    Outpatient Encounter Medications as of 04/02/2021  Medication Sig   aspirin EC 81 MG tablet Take 81 mg by mouth 2 (two) times a week.   calcium carbonate (TUMS) 500 MG chewable tablet Chew 2 tablets (400 mg of elemental calcium total) by mouth 2 (two) times daily.   Camphor-Menthol-Methyl Sal (SALONPAS) 3.06-21-08 % PTCH Place 1 application onto the skin daily as needed (pain).   levothyroxine (SYNTHROID) 75 MCG tablet Take 1 tablet (75  mcg total) by mouth daily before breakfast.   lisinopril-hydrochlorothiazide (ZESTORETIC) 20-25 MG tablet Take 1 tablet by mouth daily.   Polyethyl Glycol-Propyl Glycol (SYSTANE OP) Place 1 drop into both eyes 2 (two) times daily as needed (dry eyes).   trolamine salicylate (ASPERCREME) 10 % cream Apply 1 application topically as needed for muscle pain.   No facility-administered encounter medications on file as of 04/02/2021.   ALLERGIES: No Known Allergies  VACCINATION STATUS:  There is no immunization history on file for this patient.  HPI Theresa Buchanan is 75 y.o. female who presents today with a medical history as above. she follows in this clinic for RAI induced hypothyroidism.  However, she was found to have lateral multinodular goiter recently, in February 2022. Fine-needle aspiration in June 2022 showed thyroid malignancy. She was sent for total thyroidectomy which she underwent on March 26, 2021.  She is recovering from her surgery.  Her surgical sample showed medullary thyroid cancer measuring 3.1 cm, on the right lobe. Metastatic medullary thyroid carcinoma was found in 1 lymph node  on the right paratracheal groove, and 1 out of 4 positive lymph node in the central compartment. She remains on levothyroxine 75 mcg p.o. daily with good consistency.  She denies dysphagia, shortness of breath, nor voice change.  She denies family history of thyroid malignancy, however reports various forms of thyroid dysfunction in her family members including her mother and sisters.    Presurgical chest x-ray showed 3.3 cm mass in the right lower lobe of her lung concerning for malignancy.  CT scan was postponed in the interest of making room for possible I-131 thyroid remnant ablation.  Review of Systems  Constitutional: + Steady weight,  no fatigue, no subjective hyperthermia, no subjective hypothermia Eyes: no blurry vision, no xerophthalmia ENT: no sore throat, no nodules palpated in  throat, no dysphagia/odynophagia, no hoarseness   Objective:    Vitals with BMI 04/02/2021 03/27/2021 03/27/2021  Height 5\' 4"  - -  Weight - - -  BMI - - -  Systolic [phone]  Diastolic 76 62 64  Pulse 84 60 56    BP (!) 148/76   Pulse 84   Ht 5\' 4"  (1.626 m)   BMI 26.26 kg/m   Wt Readings from Last 3 Encounters:  03/26/21 153 lb (69.4 kg)  03/08/21 153 lb (69.4 kg)  01/18/21 155 lb 6.4 oz (70.5 kg)    Physical Exam  Constitutional:  Body mass index is 26.26 kg/m.,  not in acute distress, normal state of mind Eyes: PERRLA, EOMI, no exophthalmos ENT: moist mucous membranes, palpable thyroid, no gross thyromegaly, no gross cervical lymphadenopathy   Thyroid ultrasound August 06, 2020:  Right lobe 3.1 cm with 2.1 suspicious nodule. Left lobe 3.4 cm with 1.2 cm nonsuspicious nodule.  Fine-needle aspiration on December 12, 2020 of right-sided thyroid nodule Clinical History: 2.1 cm RML  Specimen Submitted:  A. THYROID, RIGHT, FINE NEEDLE ASPIRATION:   FINAL MICROSCOPIC DIAGNOSIS:  - Malignant cells present (Bethesda category VI)   SPECIMEN ADEQUACY:  Satisfactory for evaluation    Surgical sample from March 26, 2021 FINAL MICROSCOPIC DIAGNOSIS:   A. THYROID, TOTAL THYROIDECTOMY:  - Medullary thyroid carcinoma, 3.1 cm.  - Tumor confined within the thyroid capsule.  - Margins not involved.  - One perithyroidal lymph node negative for metastatic carcinoma (0/1).  - See oncology table.   B. LYMPH NODE, RIGHT PARATRACHEAL, EXCISION:  - Metastatic medullary carcinoma in one lymph node (1/1).   C. LYMPH NODE, CENTRAL COMPARTMENT, EXCISION:  - Metastatic medullary carcinoma in one of four lymph nodes (1/4).   Assessment & Plan:   1.  Medullary thyroid carcinoma 2.  Hypothyroidism following radioiodine therapy -I discussed the surgical findings with her.   - Her surgical cytology revealed locally invasive metastatic medullary thyroid cancer-involving at  least 2 lymph nodes in the neck.  She is status post total thyroidectomy with limited neck dissection.  She may need further neck exploration.  She is encouraged to keep her appointment with Dr. Harlow Asa, her surgeon on April 17, 2021. In light of her presurgical x-ray of the chest showing 3.3 cm mass in the right lower lobe, she will be considered for CT scan with contrast of chest, abdomen, pelvis for staging purposes.  There is no utility for I-131 thyroid remnant ablation for medullary thyroid cancer. She will be sent for labs for calcitonin and CEA. -If she still has significant tumor burden, she will be sent for oncology.   In the meantime, she is advised to continue her  levothyroxine 75 mcg p.o. daily before breakfast.   - We discussed about the correct intake of her thyroid hormone, on empty stomach at fasting, with water, separated by at least 30 minutes from breakfast and other medications,  and separated by more than 4 hours from calcium, iron, multivitamins, acid reflux medications (PPIs). -Patient is made aware of the fact that thyroid hormone replacement is needed for life, dose to be adjusted by periodic monitoring of thyroid function tests.  On a later date, she will be approached for further investigation about family history of medullary thyroid cancer.    - she is advised to maintain close follow up with Theresa Samples, PA-C for primary care needs.    I spent 25 minutes in the care of the patient today including review of labs from Thyroid Function, CMP, and other relevant labs ; imaging/biopsy records (current and previous including abstractions from other facilities); face-to-face time discussing  her lab results and symptoms, medications doses, her options of short and long term treatment based on the latest standards of care / guidelines;   and documenting the encounter.  Jenny Reichmann Petronio  participated in the discussions, expressed understanding, and voiced agreement  with the above plans.  All questions were answered to her satisfaction. she is encouraged to contact clinic should she have any questions or concerns prior to her return visit.    Follow up plan: Return in about 2 weeks (around 04/16/2021), or and CT Scan, for F/U with Pre-visit Labs.   Glade Lloyd, MD [facility] [address] [place], [place] [address] Phone: [phone]  Fax: [phone]     04/02/2021, 4:43 PM  This note was partially dictated with voice recognition software. Similar sounding words can be transcribed inadequately or may not  be corrected upon review.

## 2021-04-03 DIAGNOSIS — C73 Malignant neoplasm of thyroid gland: Secondary | ICD-10-CM | POA: Diagnosis not present

## 2021-04-06 LAB — T4, FREE: Free T4: 2.11 ng/dL — ABNORMAL HIGH (ref 0.82–1.77)

## 2021-04-06 LAB — CALCITONIN: Calcitonin: 136 pg/mL — ABNORMAL HIGH (ref 0.0–5.0)

## 2021-04-06 LAB — TSH: TSH: 6.22 u[IU]/mL — ABNORMAL HIGH (ref 0.450–4.500)

## 2021-04-06 LAB — CEA: CEA: 62.9 ng/mL — ABNORMAL HIGH (ref 0.0–4.7)

## 2021-04-09 ENCOUNTER — Ambulatory Visit (HOSPITAL_COMMUNITY)
Admission: RE | Admit: 2021-04-09 | Discharge: 2021-04-09 | Disposition: A | Discharge status: Home / Self Care | Payer: Medicare HMO | Source: Ambulatory Visit | Attending: "Endocrinology | Admitting: "Endocrinology

## 2021-04-09 ENCOUNTER — Other Ambulatory Visit: Payer: Self-pay

## 2021-04-09 ENCOUNTER — Other Ambulatory Visit: Payer: Self-pay | Admitting: "Endocrinology

## 2021-04-09 ENCOUNTER — Encounter (HOSPITAL_COMMUNITY): Payer: Self-pay | Admitting: Radiology

## 2021-04-09 DIAGNOSIS — E890 Postprocedural hypothyroidism: Secondary | ICD-10-CM | POA: Diagnosis not present

## 2021-04-09 DIAGNOSIS — Q859 Phakomatosis, unspecified: Secondary | ICD-10-CM | POA: Insufficient documentation

## 2021-04-09 DIAGNOSIS — J9811 Atelectasis: Secondary | ICD-10-CM | POA: Diagnosis not present

## 2021-04-09 DIAGNOSIS — C73 Malignant neoplasm of thyroid gland: Secondary | ICD-10-CM | POA: Insufficient documentation

## 2021-04-09 DIAGNOSIS — N289 Disorder of kidney and ureter, unspecified: Secondary | ICD-10-CM | POA: Insufficient documentation

## 2021-04-09 DIAGNOSIS — K7689 Other specified diseases of liver: Secondary | ICD-10-CM | POA: Diagnosis not present

## 2021-04-09 MED ORDER — IOHEXOL 300 MG/ML  SOLN
100.0000 mL | Freq: Once | INTRAMUSCULAR | Status: AC | PRN
  Administered 2021-04-09: 100 mL via INTRAVENOUS

## 2021-04-13 NOTE — Progress Notes (Signed)
Theresa Buchanan, [place] [address]   CLINIC:  Medical Oncology/Hematology  CONSULT NOTE  Patient Care Team: Scherrie Bateman as PCP - General (Family Medicine)  CHIEF COMPLAINTS/PURPOSE OF CONSULTATION:  Evaluation of medullary thyroid carcinoma  HISTORY OF PRESENTING ILLNESS:  [name] 75 y.o. female is here because of evaluation of medullary thyroid carcinoma, at the request of Dr. Dorris Fetch.  Today she reports feeling well. She has a history of thyroid disease for which she received radiation therapy at [facility] about 20 years ago, and she underwent thyroidectomy on 03/26/2021. She denies difficulty or pain with swallowing. She reports unintentionally losing 10 pounds over the past 3 months. She has had pain in the back of her neck for the past 5 years which she attributes to arthritis. Her appetite is good, and she denies n/v/d, headaches, and vision changes. She currently lives at home with her husband, and she is able to do all of her typical home activities. 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 She denies smoking history, and she denies family history of cancer. Her mother and sister both have a history of thyroid disease treated with thyroidectomy. Her sister has a history of HTN, and her brother has a history of CVA.   MEDICAL HISTORY:  Past Medical History:  Diagnosis Date   Hypertension    Thyroid disease     SURGICAL HISTORY: Past Surgical History:  Procedure Laterality Date   ABDOMINAL HYSTERECTOMY     COLON SURGERY     THYROIDECTOMY N/A 03/26/2021   Procedure: TOTAL THYROIDECTOMY WITH LIMITED LYMPH NODE DISSECTION;  Surgeon: Armandina Gemma, MD;  Location: [facility];  Service: General;  Laterality: N/A;   TUBAL LIGATION      SOCIAL HISTORY: Social History   Socioeconomic History   Marital status: Married    Spouse name: Not on file   Number of children: Not on file   Years of  education: Not on file   Highest education level: Not on file  Occupational History   Not on file  Tobacco Use   Smoking status: Never   Smokeless tobacco: Never  Vaping Use   Vaping Use: Never used  Substance and Sexual Activity   Alcohol use: Never   Drug use: Never   Sexual activity: Not on file  Other Topics Concern   Not on file  Social History Narrative   Not on file   Social Determinants of Health   Financial Resource Strain: Not on file  Food Insecurity: Not on file  Transportation Needs: Not on file  Physical Activity: Not on file  Stress: Not on file  Social Connections: Not on file  Intimate Partner Violence: Not on file    FAMILY HISTORY: Family History  Problem Relation Age of Onset   Hypertension Mother    Thyroid disease Mother    Diabetes Mother     ALLERGIES:  has No Known Allergies.  MEDICATIONS:  Current Outpatient Medications  Medication Sig Dispense Refill   aspirin EC 81 MG tablet Take 81 mg by mouth 2 (two) times a week.     calcium carbonate (TUMS) 500 MG chewable tablet Chew 2 tablets (400 mg of elemental calcium total) by mouth 2 (two) times daily. 90 tablet 1   Camphor-Menthol-Methyl Sal (SALONPAS) 3.06-21-08 % PTCH Place 1 application onto the skin daily as needed (pain).     levothyroxine (SYNTHROID) 75 MCG tablet Take 1  tablet (75 mcg total) by mouth daily before breakfast. 90 tablet 1   lisinopril-hydrochlorothiazide (ZESTORETIC) 20-25 MG tablet Take 1 tablet by mouth daily.     Polyethyl Glycol-Propyl Glycol (SYSTANE OP) Place 1 drop into both eyes 2 (two) times daily as needed (dry eyes).     traMADol (ULTRAM) 50 MG tablet Take 50 mg by mouth every 6 (six) hours as needed.     trolamine salicylate (ASPERCREME) 10 % cream Apply 1 application topically as needed for muscle pain.     No current facility-administered medications for this visit.    REVIEW OF SYSTEMS:   Review of Systems  Constitutional:  Positive for unexpected weight  change (-10 lbs). Negative for appetite change (75%) and fatigue (60%).  HENT:   Negative for trouble swallowing.   Eyes:  Negative for eye problems.  Gastrointestinal:  Positive for constipation. Negative for diarrhea, nausea and vomiting.  Musculoskeletal:  Positive for neck pain.  Neurological:  Negative for headaches.  All other systems reviewed and are negative.   PHYSICAL EXAMINATION: ECOG PERFORMANCE STATUS: 1 - Symptomatic but completely ambulatory  Vitals:   04/15/21 0822  BP: (!) 155/79  Pulse: 66  Resp: 16  Temp: 98 F (36.7 C)  SpO2: 98%   Filed Weights   04/15/21 0822  Weight: 152 lb 5.4 oz (69.1 kg)   Physical Exam Vitals reviewed.  Constitutional:      Appearance: Normal appearance.  Neck:     Thyroid: No thyroid tenderness.  Cardiovascular:     Rate and Rhythm: Normal rate and regular rhythm.     Pulses: Normal pulses.     Heart sounds: Normal heart sounds.  Pulmonary:     Effort: Pulmonary effort is normal.     Breath sounds: Normal breath sounds.  Abdominal:     Palpations: Abdomen is soft. There is no hepatomegaly, splenomegaly or mass.     Tenderness: There is no abdominal tenderness.  Musculoskeletal:     Cervical back: Neck supple.     Right lower leg: No edema.     Left lower leg: No edema.  Lymphadenopathy:     Cervical: No cervical adenopathy.     Right cervical: No superficial cervical adenopathy.    Left cervical: No superficial cervical adenopathy.  Neurological:     General: No focal deficit present.     Mental Status: She is alert and oriented to person, place, and time.  Psychiatric:        Mood and Affect: Mood normal.        Behavior: Behavior normal.     LABORATORY DATA:  I have reviewed the data as listed CBC Latest Ref Rng & Units 03/27/2021 03/21/2021  WBC 4.0 - 10.5 K/uL 8.7 5.3  Hemoglobin 12.0 - 15.0 g/dL 11.9(L) 13.0  Hematocrit 36.0 - 46.0 % 35.3(L) 39.4  Platelets 150 - 400 K/uL 293 313   CMP Latest Ref Rng &  Units 03/27/2021 03/21/2021  Glucose 70 - 99 mg/dL 90 95  BUN 8 - 23 mg/dL 18 20  Creatinine 0.44 - 1.00 mg/dL 0.97 0.90  Sodium 135 - 145 mmol/L 136 135  Potassium 3.5 - 5.1 mmol/L 3.1(L) 3.3(L)  Chloride 98 - 111 mmol/L 99 99  CO2 22 - 32 mmol/L 27 28  Calcium 8.9 - 10.3 mg/dL 9.4 9.9    RADIOGRAPHIC STUDIES: I have personally reviewed the radiological images as listed and agreed with the findings in the report. CT CHEST ABDOMEN PELVIS W CONTRAST  Result Date: 04/09/2021 CLINICAL DATA:  A 75 year old female presents for thyroid cancer staging. Recent diagnosis of medullary thyroid cancer. EXAM: CT CHEST, ABDOMEN, AND PELVIS WITH CONTRAST TECHNIQUE: Multidetector CT imaging of the chest, abdomen and pelvis was performed following the standard protocol during bolus administration of intravenous contrast. CONTRAST:  175m OMNIPAQUE IOHEXOL 300 MG/ML  SOLN COMPARISON:  Chest x-ray of April 09, 2021. FINDINGS: CT CHEST FINDINGS Cardiovascular: Aorta is normal caliber. Central pulmonary arteries are unremarkable on venous phase assessment. Heart size normal without substantial pericardial effusion. Mediastinum/Nodes: No axillary mediastinal or hilar adenopathy. The rounded area measuring 10 mm (image 10/2) along the anterior chest wall below the thyroid just anterior to the LEFT clavicle. Esophagus grossly normal. Stranding about the thyroid bed in this patient post recent thyroidectomy. No focal fluid. Lungs/Pleura: Well-circumscribed pulmonary mass in the RIGHT lower lobe. This measures 3 x 3 cm and displays gross evidence of macroscopic fat measuring -62 Hounsfield units on repeat density measurements within the central portion of the lesion some areas near the periphery as much as -71 Hounsfield units. Airways are patent. Minimal basilar atelectasis. No effusion. Musculoskeletal: See below for full musculoskeletal details. CT ABDOMEN PELVIS FINDINGS Hepatobiliary: Smooth hepatic contours. Hypodense  hepatic lesions, very small and seen throughout the RIGHT hepatic lobe best visualized on coronal images. Some are low-density, for instance on image 60 of series 2 there is an 11 mm area in hepatic subsegment V. scattered smaller areas are however noted with very subtle low-density, for instance on image 54 of series 4 3-4 mm area in the RIGHT hepatic lobe, tiny area also on image 65 of series 4 in the RIGHT hepatic lobe Larger area in the posterior RIGHT hepatic lobe on image 99 of series 4 measuring 6 mm. Another area in hepatic subsegment VI measuring 6 mm is well-circumscribed and more likely a small cyst. Portal vein is patent. No pericholecystic stranding or biliary duct dilation. Pancreas: Normal, without mass, inflammation or ductal dilatation. Spleen: Normal spleen. Adrenals/Urinary Tract: Adrenal glands are normal. Symmetric renal enhancement. Heterogeneous enhancing lesion arises from the lower pole of the RIGHT kidney measuring 18 x 14 mm extending medially from the lower pole. (Image 63/2) There is a second area in the medial RIGHT kidney on image 21 of series 8 measuring 18 x 18 mm that is immediately adjacent to or contiguous with this focus. 8 mm solid appearing lesion in the inferior RIGHT kidney, lower pole Numerous other foci of heterogeneity, most too small for definitive characterization in the RIGHT kidney but suspicious given other findings outlined above. Lesion with potential enhancing nodule, mainly cystic in the interpolar-upper pole RIGHT kidney along the hilar line (image 54/2) 19 x 17 mm. No hydronephrosis. Urinary bladder is smooth and without signs of adjacent stranding. Stomach/Bowel: No acute gastric process by CT. No small bowel distension. No Peri enteric stranding. Post appendectomy. Vascular/Lymphatic: Unremarkable appearance of aorta and IVC. There is no gastrohepatic or hepatoduodenal ligament lymphadenopathy. No retroperitoneal or mesenteric lymphadenopathy. No pelvic  sidewall lymphadenopathy. Reproductive: Post hysterectomy.  No adnexal mass. Other: No ascites.  No free air. Musculoskeletal: No acute musculoskeletal process. No destructive bone finding. Spinal degenerative changes. IMPRESSION: Post thyroidectomy. Small RIGHT hepatic lobe lesions, indeterminate. In the setting of medullary thyroid cancer MRI of the abdomen with and without contrast is suggested for further evaluation. At least 4 RIGHT renal lesions suspicious for neoplasm. Abdominal MRI could be utilized for more complete assessment of these findings as well. Findings of  pulmonary hamartoma in the RIGHT lower lobe measuring approximately 3 cm. Potential subcutaneous lesion at the LEFT thoracic inlet versus obey shows cysts, correlate with direct clinical inspection. Electronically Signed   By: Zetta Bills M.D.   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 - FNA of multinodular goiter in June 2022 showed positive for malignancy. - Total thyroidectomy with limited central compartment lymph node dissection by Dr. Harlow Asa on 03/26/2021 - Pathology shows medullary thyroid cancer, 3.1 x 1.5 x 1.5 cm, tumor confined within the thyroid capsule, margins negative.  0/1 perithyroidal lymph node negative.  1/1 metastatic medullary carcinoma in the right paratracheal lymph node.  1/4 metastatic lymph node in the central compartment.  pT2, PN1a. - CT CAP with contrast on 04/09/2021 showed hypodense hepatic lesions, very small seen throughout the right hepatic lobe, some of which are low-density.  Heterogeneous enhancing lesion in the lower pole of the right kidney measuring 18 x 14 mm.  Second area in the medial right kidney measuring 18 x 18 mm.  8 mm solid-appearing lesion in the inferior right kidney lower pole.  Pulmonary hamartoma in the right lower lobe measuring 3 cm. - Labs on 04/03/2021 with calcitonin 136 and CEA  62.9.   Family/social history: - Lives at home with her husband.  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  She is non-smoker. - Her mother and sister had thyroid problems but no history of malignancies.   PLAN:  Medullary thyroid carcinoma: - We have reviewed pathology reports and imaging studies in detail. - Recommend MRI of the abdomen with and without contrast to better delineate the liver lesions and kidney lesions. - We will also do a whole-body bone scan. - We will do pheochromocytoma work-up with 24-hour urine metanephrines and catecholamines. - We will do further work-up for RET oncogene mutations (genetic testing). - We will also send NGS testing. - RTC 2 weeks for follow-up.  We will plan to repeat tumor markers at that time.   All questions were answered. The patient knows to call the clinic with any problems, questions or concerns.   Derek Jack, MD, 04/15/21 12:51 PM  [place] [phone]   I, Thana Ates, am acting as a scribe for Dr. Derek Jack.  I, Derek Jack MD, have reviewed the above documentation for accuracy and completeness, and I agree with the above.

## 2021-04-15 ENCOUNTER — Other Ambulatory Visit: Payer: Self-pay

## 2021-04-15 ENCOUNTER — Inpatient Hospital Stay (HOSPITAL_COMMUNITY): Payer: Medicare HMO

## 2021-04-15 ENCOUNTER — Inpatient Hospital Stay (HOSPITAL_COMMUNITY): Payer: Medicare HMO | Attending: Hematology | Admitting: Hematology

## 2021-04-15 ENCOUNTER — Encounter (HOSPITAL_COMMUNITY): Payer: Self-pay | Admitting: Hematology

## 2021-04-15 VITALS — BP 155/79 | HR 66 | Temp 98.0°F | Resp 16 | Ht 65.0 in | Wt 152.3 lb

## 2021-04-15 DIAGNOSIS — I10 Essential (primary) hypertension: Secondary | ICD-10-CM | POA: Insufficient documentation

## 2021-04-15 DIAGNOSIS — C73 Malignant neoplasm of thyroid gland: Secondary | ICD-10-CM | POA: Insufficient documentation

## 2021-04-15 DIAGNOSIS — E039 Hypothyroidism, unspecified: Secondary | ICD-10-CM | POA: Insufficient documentation

## 2021-04-15 DIAGNOSIS — S27321A Contusion of lung, unilateral, initial encounter: Secondary | ICD-10-CM

## 2021-04-15 NOTE — Patient Instructions (Addendum)
[place] at [facility] Discharge Instructions  You were seen and examined today by Dr. Delton Coombes. Dr. Delton Coombes is a medical oncologist, meaning that he specializes in the management of cancer diagnoses. Dr. Delton Coombes discussed your past medical history, family history of cancers and the events that led to you being here today.  You have been diagnosed with Medullary Thyroid Cancer. Dr. Delton Coombes has recommended additional lab work and scans to identify the stage of your cancer.  Follow-up with Dr. Delton Coombes as scheduled.   Thank you for choosing [place] at [facility] to provide your oncology and hematology care.  To afford each patient quality time with our provider, please arrive at least 15 minutes before your scheduled appointment time.   If you have a lab appointment with the [facility] please come in thru the Main Entrance and check in at the main information desk.  You need to re-schedule your appointment should you arrive 10 or more minutes late.  We strive to give you quality time with our providers, and arriving late affects you and other patients whose appointments are after yours.  Also, if you no show three or more times for appointments you may be dismissed from the clinic at the providers discretion.     Again, thank you for choosing [facility].  Our hope is that these requests will decrease the amount of time that you wait before being seen by our physicians.       _____________________________________________________________  Should you have questions after your visit to [facility], please contact our office at [phone] and follow the prompts.  Our office hours are 8:00 a.m. and 4:30 p.m. Monday - Friday.  Please note that voicemails left after 4:00 p.m. may not be returned until the following business day.  We are closed weekends and major holidays.  You do have access to a nurse 24-7,  just call the main number to the clinic [phone] and do not press any options, hold on the line and a nurse will answer the phone.    For prescription refill requests, have your pharmacy contact our office and allow 72 hours.    Due to Covid, you will need to wear a mask upon entering the hospital. If you do not have a mask, a mask will be given to you at the Main Entrance upon arrival. For doctor visits, patients may have 1 support person age 38 or older with them. For treatment visits, patients can not have anyone with them due to social distancing guidelines and our immunocompromised population.

## 2021-04-17 ENCOUNTER — Inpatient Hospital Stay (HOSPITAL_COMMUNITY): Payer: Medicare HMO | Attending: Hematology

## 2021-04-17 ENCOUNTER — Other Ambulatory Visit: Payer: Self-pay

## 2021-04-17 ENCOUNTER — Other Ambulatory Visit (HOSPITAL_COMMUNITY): Payer: Self-pay

## 2021-04-17 DIAGNOSIS — N2889 Other specified disorders of kidney and ureter: Secondary | ICD-10-CM | POA: Insufficient documentation

## 2021-04-17 DIAGNOSIS — C73 Malignant neoplasm of thyroid gland: Secondary | ICD-10-CM

## 2021-04-17 DIAGNOSIS — I10 Essential (primary) hypertension: Secondary | ICD-10-CM | POA: Insufficient documentation

## 2021-04-18 ENCOUNTER — Ambulatory Visit: Payer: Medicare HMO | Admitting: "Endocrinology

## 2021-04-19 LAB — METANEPHRINES, URINE, 24 HOUR
Metaneph Total, Ur: 139 ug/L
Metanephrines, 24H Ur: 156 ug/24 hr (ref 36–209)
Normetanephrine, 24H Ur: 296 ug/24 hr (ref 131–612)
Normetanephrine, Ur: 263 ug/L
Total Volume: 1125

## 2021-04-19 LAB — METANEPHRINES, PLASMA
Metanephrine, Free: 48.4 pg/mL (ref 0.0–88.0)
Normetanephrine, Free: 125.4 pg/mL (ref 0.0–285.2)

## 2021-04-22 ENCOUNTER — Encounter (HOSPITAL_COMMUNITY)
Admission: RE | Admit: 2021-04-22 | Discharge: 2021-04-22 | Disposition: A | Discharge status: Home / Self Care | Payer: Medicare HMO | Source: Ambulatory Visit | Attending: Hematology | Admitting: Hematology

## 2021-04-22 ENCOUNTER — Other Ambulatory Visit: Payer: Self-pay

## 2021-04-22 DIAGNOSIS — C73 Malignant neoplasm of thyroid gland: Secondary | ICD-10-CM | POA: Insufficient documentation

## 2021-04-22 DIAGNOSIS — Z85850 Personal history of malignant neoplasm of thyroid: Secondary | ICD-10-CM | POA: Diagnosis not present

## 2021-04-23 LAB — CATECHOLAMINES, FRACTIONATED, PLASMA
Dopamine: <30 pg/mL (ref 0–48)
Epinephrine: 58 pg/mL (ref 0–62)
Norepinephrine: 776 pg/mL (ref 0–874)

## 2021-04-25 DIAGNOSIS — C770 Secondary and unspecified malignant neoplasm of lymph nodes of head, face and neck: Secondary | ICD-10-CM | POA: Diagnosis not present

## 2021-04-25 DIAGNOSIS — C7989 Secondary malignant neoplasm of other specified sites: Secondary | ICD-10-CM | POA: Diagnosis not present

## 2021-04-25 DIAGNOSIS — C73 Malignant neoplasm of thyroid gland: Secondary | ICD-10-CM | POA: Diagnosis not present

## 2021-04-26 ENCOUNTER — Ambulatory Visit (HOSPITAL_COMMUNITY)
Admission: RE | Admit: 2021-04-26 | Discharge: 2021-04-26 | Disposition: A | Discharge status: Home / Self Care | Payer: Medicare HMO | Source: Ambulatory Visit | Attending: Hematology | Admitting: Hematology

## 2021-04-26 ENCOUNTER — Other Ambulatory Visit: Payer: Self-pay

## 2021-04-26 DIAGNOSIS — K7689 Other specified diseases of liver: Secondary | ICD-10-CM | POA: Diagnosis not present

## 2021-04-26 DIAGNOSIS — I700 Atherosclerosis of aorta: Secondary | ICD-10-CM | POA: Diagnosis not present

## 2021-04-26 DIAGNOSIS — C73 Malignant neoplasm of thyroid gland: Secondary | ICD-10-CM | POA: Insufficient documentation

## 2021-04-26 DIAGNOSIS — N289 Disorder of kidney and ureter, unspecified: Secondary | ICD-10-CM | POA: Diagnosis not present

## 2021-04-26 DIAGNOSIS — Q859 Phakomatosis, unspecified: Secondary | ICD-10-CM | POA: Diagnosis not present

## 2021-04-26 MED ORDER — GADOBUTROL 1 MMOL/ML IV SOLN
7.0000 mL | Freq: Once | INTRAVENOUS | Status: AC | PRN
  Administered 2021-04-26: 7 mL via INTRAVENOUS

## 2021-04-28 NOTE — Progress Notes (Signed)
[place], [place] [address]   CLINIC:  Medical Oncology/Hematology  PCP:  Scherrie Bateman [address] / [place] [address] [phone]   REASON FOR VISIT:  Follow-up for medullary thyroid carcinoma  PRIOR THERAPY: Total thyroidectomy with limited central compartment lymph node dissection by Dr. Harlow Asa on 03/26/2021  NGS Results: not done  CURRENT THERAPY: surveillance  BRIEF ONCOLOGIC HISTORY:  Oncology History   No history exists.    CANCER STAGING: Cancer Staging Medullary thyroid carcinoma [facility]) Staging form: Thyroid - Medullary, AJCC 8th Edition - Clinical stage from 04/15/2021: Stage IVC (cT2, cN1a, cM1) - Unsigned   INTERVAL HISTORY:  Ms. BERNEDETTE AUSTON, a 75 y.o. female, returns for routine follow-up of her medullary thyroid carcinoma. Silvana was last seen on 04/15/2021.   Today she reports feeling good. Her appetite is good, and she denies trouble swallowing.   REVIEW OF SYSTEMS:  Review of Systems  Constitutional:  Negative for appetite change (75%) and fatigue (60%).  HENT:   Negative for trouble swallowing.   Respiratory:  Positive for shortness of breath.   Gastrointestinal:  Positive for constipation.  Musculoskeletal:  Positive for arthralgias (3/10 L shoulder).  All other systems reviewed and are negative.  PAST MEDICAL/SURGICAL HISTORY:  Past Medical History:  Diagnosis Date   Hypertension    Thyroid disease    Past Surgical History:  Procedure Laterality Date   ABDOMINAL HYSTERECTOMY     COLON SURGERY     THYROIDECTOMY N/A 03/26/2021   Procedure: TOTAL THYROIDECTOMY WITH LIMITED LYMPH NODE DISSECTION;  Surgeon: Armandina Gemma, MD;  Location: [facility];  Service: General;  Laterality: N/A;   TUBAL LIGATION      SOCIAL HISTORY:  Social History   Socioeconomic History   Marital status: Married    Spouse name: Not on file   Number of children: Not on file   Years of education:  Not on file   Highest education level: Not on file  Occupational History   Not on file  Tobacco Use   Smoking status: Never   Smokeless tobacco: Never  Vaping Use   Vaping Use: Never used  Substance and Sexual Activity   Alcohol use: Never   Drug use: Never   Sexual activity: Not on file  Other Topics Concern   Not on file  Social History Narrative   Not on file   Social Determinants of Health   Financial Resource Strain: Not on file  Food Insecurity: Not on file  Transportation Needs: Not on file  Physical Activity: Not on file  Stress: Not on file  Social Connections: Not on file  Intimate Partner Violence: Not on file    FAMILY HISTORY:  Family History  Problem Relation Age of Onset   Hypertension Mother    Thyroid disease Mother    Diabetes Mother     CURRENT MEDICATIONS:  Current Outpatient Medications  Medication Sig Dispense Refill   aspirin EC 81 MG tablet Take 81 mg by mouth 2 (two) times a week.     calcium carbonate (TUMS) 500 MG chewable tablet Chew 2 tablets (400 mg of elemental calcium total) by mouth 2 (two) times daily. 90 tablet 1   levothyroxine (SYNTHROID) 75 MCG tablet Take 1 tablet (75 mcg total) by mouth daily before breakfast. 90 tablet 1   lisinopril-hydrochlorothiazide (ZESTORETIC) 20-25 MG tablet Take 1 tablet by mouth daily.     Camphor-Menthol-Methyl Sal (SALONPAS) 3.06-21-08 % PTCH Place  1 application onto the skin daily as needed (pain). (Patient not taking: Reported on 04/29/2021)     Polyethyl Glycol-Propyl Glycol (SYSTANE OP) Place 1 drop into both eyes 2 (two) times daily as needed (dry eyes). (Patient not taking: Reported on 04/29/2021)     traMADol (ULTRAM) 50 MG tablet Take 50 mg by mouth every 6 (six) hours as needed. (Patient not taking: Reported on 04/29/2021)     trolamine salicylate (ASPERCREME) 10 % cream Apply 1 application topically as needed for muscle pain. (Patient not taking: Reported on 04/29/2021)     No current  facility-administered medications for this visit.    ALLERGIES:  No Known Allergies  PHYSICAL EXAM:  Performance status (ECOG): 1 - Symptomatic but completely ambulatory  Vitals:   04/29/21 1004  BP: (!) 149/74  Pulse: 65  Resp: 18  Temp: 98.4 F (36.9 C)  SpO2: 100%   Wt Readings from Last 3 Encounters:  04/29/21 156 lb 6.4 oz (70.9 kg)  04/15/21 152 lb 5.4 oz (69.1 kg)  03/26/21 153 lb (69.4 kg)   Physical Exam Vitals reviewed.  Constitutional:      Appearance: Normal appearance.  Neck:     Comments: Thyroidectomy scar well healed Cardiovascular:     Rate and Rhythm: Normal rate and regular rhythm.     Pulses: Normal pulses.     Heart sounds: Normal heart sounds.  Pulmonary:     Effort: Pulmonary effort is normal.     Breath sounds: Normal breath sounds.  Neurological:     General: No focal deficit present.     Mental Status: She is alert and oriented to person, place, and time.  Psychiatric:        Mood and Affect: Mood normal.        Behavior: Behavior normal.     LABORATORY DATA:  I have reviewed the labs as listed.  CBC Latest Ref Rng & Units 03/27/2021 03/21/2021  WBC 4.0 - 10.5 K/uL 8.7 5.3  Hemoglobin 12.0 - 15.0 g/dL 11.9(L) 13.0  Hematocrit 36.0 - 46.0 % 35.3(L) 39.4  Platelets 150 - 400 K/uL 293 313   CMP Latest Ref Rng & Units 03/27/2021 03/21/2021  Glucose 70 - 99 mg/dL 90 95  BUN 8 - 23 mg/dL 18 20  Creatinine 0.44 - 1.00 mg/dL 0.97 0.90  Sodium 135 - 145 mmol/L 136 135  Potassium 3.5 - 5.1 mmol/L 3.1(L) 3.3(L)  Chloride 98 - 111 mmol/L 99 99  CO2 22 - 32 mmol/L 27 28  Calcium 8.9 - 10.3 mg/dL 9.4 9.9    DIAGNOSTIC IMAGING:  I have independently reviewed the scans and discussed with the patient. MR Abdomen W Wo Contrast  Result Date: 04/26/2021 CLINICAL DATA:  Medullary thyroid carcinoma. CT demonstrating liver and bilateral renal lesions. EXAM: MRI ABDOMEN WITHOUT AND WITH CONTRAST TECHNIQUE: Multiplanar multisequence MR imaging of the  abdomen was performed both before and after the administration of intravenous contrast. CONTRAST:  73m GADAVIST GADOBUTROL 1 MMOL/ML IV SOLN COMPARISON:  CT 04/09/2021. FINDINGS: Lower chest: Mild cardiomegaly, without pericardial or pleural effusion. Right lower lobe hamartoma of 3.0 cm. Hepatobiliary: Multiple tiny hepatic cysts. The largest is in the right hepatic lobe at 11 mm. No suspicious liver lesion. Normal gallbladder, without biliary ductal dilatation. Pancreas:  Normal, without mass or ductal dilatation. Spleen:  Normal in size, without focal abnormality. Adrenals/Urinary Tract:  Normal adrenal glands. Bilateral too small to characterize renal lesions. The most suspicious lesion is in the anterior lower pole  right kidney. This measures on the order of 1.5 cm on 52/21. Demonstrates postcontrast nodular enhancement, especially along its peripheral wall at 7 mm on 51/23 and 52/22. Either contiguous or adjacent to this superiorly and centrally is an area of precontrast T1 hyperintensity of 1.0 cm on 46/16. No post-contrast enhancement within this portion of the kidney, including on 45/24. Lateral interpolar right renal lesion of 8 mm is most consistent with a minimally complex cyst, including on 45/21 and 43/24. Within the upper pole right kidney centrally is a 1.8 cm T2 hyperintense lesion on 14/9. This demonstrates mild T1 hyperintensity on 37/16. Based on subtracted images, primarily felt to be nonenhancing. An area of apparent enhancement within, including at 8 mm on 34/24, is favored to represent interdigitation/impression of renal parenchyma into this probable sinus cyst. Stomach/Bowel: Normal stomach and abdominal bowel loops. Vascular/Lymphatic: Aortic atherosclerosis. Patent renal veins. No retroperitoneal or retrocrural adenopathy. Other:  No ascites. Musculoskeletal: No acute osseous abnormality. IMPRESSION: 1. Lower pole right renal lesion is favored to represent a cystic renal cell carcinoma.  Bosniak IV (2019). An adjacent area of more central precontrast T1 hyperintensity may represent a separate hemorrhagic cyst or less likely be part of the exophytic lesion. 2. Central upper pole right renal lesion is favored to represent a complex renal sinus cyst but is indeterminate. Presuming the patient does not undergo complete nephrectomy, recommend follow-up pre and post contrast abdominal MRI at 6 months. 3. No suspicious liver lesion. 4. No evidence of abdominal metastatic disease. 5. Right lower lobe hamartoma. 6.  Aortic Atherosclerosis (ICD10-I70.0). Electronically Signed   By: Abigail Miyamoto M.D.   On: 04/26/2021 13:58   NM Bone Scan Whole Body  Result Date: 04/24/2021 CLINICAL DATA:  History of thyroid cancer. EXAM: NUCLEAR MEDICINE WHOLE BODY BONE SCAN TECHNIQUE: Whole body anterior and posterior images were obtained approximately 3 hours after intravenous injection of radiopharmaceutical. RADIOPHARMACEUTICALS:  20 mCi Technetium-37mMDP IV COMPARISON:  CT chest 04/09/2021. FINDINGS: Bilateral renal function and excretion. Punctate area of increased radiopharmaceutical uptake noted projected over the right posterior twelfth rib. This could represent focal rib activity or renal activity. Right rib series suggested for further evaluation. No other bony abnormalities identified. IMPRESSION: Punctate area of increased radiopharmaceutical uptake noted projected over the right posterior twelfth rib. This could represent focal rib activity or renal activity. Right rib series suggested to exclude a focal rib lesion. No other bony abnormalities identified. Electronically Signed   By: TMarcello Moores Register M.D.   On: 04/24/2021 05:33   CT CHEST ABDOMEN PELVIS W CONTRAST  Result Date: 04/09/2021 CLINICAL DATA:  A 75year old female presents for thyroid cancer staging. Recent diagnosis of medullary thyroid cancer. EXAM: CT CHEST, ABDOMEN, AND PELVIS WITH CONTRAST TECHNIQUE: Multidetector CT imaging of the chest,  abdomen and pelvis was performed following the standard protocol during bolus administration of intravenous contrast. CONTRAST:  1034mOMNIPAQUE IOHEXOL 300 MG/ML  SOLN COMPARISON:  Chest x-ray of April 09, 2021. FINDINGS: CT CHEST FINDINGS Cardiovascular: Aorta is normal caliber. Central pulmonary arteries are unremarkable on venous phase assessment. Heart size normal without substantial pericardial effusion. Mediastinum/Nodes: No axillary mediastinal or hilar adenopathy. The rounded area measuring 10 mm (image 10/2) along the anterior chest wall below the thyroid just anterior to the LEFT clavicle. Esophagus grossly normal. Stranding about the thyroid bed in this patient post recent thyroidectomy. No focal fluid. Lungs/Pleura: Well-circumscribed pulmonary mass in the RIGHT lower lobe. This measures 3 x 3 cm and displays gross evidence of  macroscopic fat measuring -62 Hounsfield units on repeat density measurements within the central portion of the lesion some areas near the periphery as much as -71 Hounsfield units. Airways are patent. Minimal basilar atelectasis. No effusion. Musculoskeletal: See below for full musculoskeletal details. CT ABDOMEN PELVIS FINDINGS Hepatobiliary: Smooth hepatic contours. Hypodense hepatic lesions, very small and seen throughout the RIGHT hepatic lobe best visualized on coronal images. Some are low-density, for instance on image 60 of series 2 there is an 11 mm area in hepatic subsegment V. scattered smaller areas are however noted with very subtle low-density, for instance on image 54 of series 4 3-4 mm area in the RIGHT hepatic lobe, tiny area also on image 65 of series 4 in the RIGHT hepatic lobe Larger area in the posterior RIGHT hepatic lobe on image 99 of series 4 measuring 6 mm. Another area in hepatic subsegment VI measuring 6 mm is well-circumscribed and more likely a small cyst. Portal vein is patent. No pericholecystic stranding or biliary duct dilation. Pancreas:  Normal, without mass, inflammation or ductal dilatation. Spleen: Normal spleen. Adrenals/Urinary Tract: Adrenal glands are normal. Symmetric renal enhancement. Heterogeneous enhancing lesion arises from the lower pole of the RIGHT kidney measuring 18 x 14 mm extending medially from the lower pole. (Image 63/2) There is a second area in the medial RIGHT kidney on image 21 of series 8 measuring 18 x 18 mm that is immediately adjacent to or contiguous with this focus. 8 mm solid appearing lesion in the inferior RIGHT kidney, lower pole Numerous other foci of heterogeneity, most too small for definitive characterization in the RIGHT kidney but suspicious given other findings outlined above. Lesion with potential enhancing nodule, mainly cystic in the interpolar-upper pole RIGHT kidney along the hilar line (image 54/2) 19 x 17 mm. No hydronephrosis. Urinary bladder is smooth and without signs of adjacent stranding. Stomach/Bowel: No acute gastric process by CT. No small bowel distension. No Peri enteric stranding. Post appendectomy. Vascular/Lymphatic: Unremarkable appearance of aorta and IVC. There is no gastrohepatic or hepatoduodenal ligament lymphadenopathy. No retroperitoneal or mesenteric lymphadenopathy. No pelvic sidewall lymphadenopathy. Reproductive: Post hysterectomy.  No adnexal mass. Other: No ascites.  No free air. Musculoskeletal: No acute musculoskeletal process. No destructive bone finding. Spinal degenerative changes. IMPRESSION: Post thyroidectomy. Small RIGHT hepatic lobe lesions, indeterminate. In the setting of medullary thyroid cancer MRI of the abdomen with and without contrast is suggested for further evaluation. At least 4 RIGHT renal lesions suspicious for neoplasm. Abdominal MRI could be utilized for more complete assessment of these findings as well. Findings of pulmonary hamartoma in the RIGHT lower lobe measuring approximately 3 cm. Potential subcutaneous lesion at the LEFT thoracic inlet  versus obey shows cysts, correlate with direct clinical inspection. Electronically Signed   By: Zetta Bills M.D.   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 - FNA of multinodular goiter in June 2022 showed positive for malignancy. - Total thyroidectomy with limited central compartment lymph node dissection by Dr. Harlow Asa on 03/26/2021 - Pathology shows medullary thyroid cancer, 3.1 x 1.5 x 1.5 cm, tumor confined within the thyroid capsule, margins negative.  0/1 perithyroidal lymph node negative.  1/1 metastatic medullary carcinoma in the right paratracheal lymph node.  1/4 metastatic lymph node in the central compartment.  pT2, PN1a. - CT CAP with contrast on 04/09/2021 showed hypodense hepatic lesions, very small seen throughout the right hepatic lobe, some  of which are low-density.  Heterogeneous enhancing lesion in the lower pole of the right kidney measuring 18 x 14 mm.  Second area in the medial right kidney measuring 18 x 18 mm.  8 mm solid-appearing lesion in the inferior right kidney lower pole.  Pulmonary hamartoma in the right lower lobe measuring 3 cm. - Labs on 04/03/2021 with calcitonin 136 and CEA 62.9. - NGS testing on 04/25/2021-BRAF negative, RET fusion not detected.  MSI-stable.  MMR-proficient.  Gillett 1/2/3 not detected.  TMB low.  PD-L1 is negative.  HRAS pathogenic variant exon 3.    Family/social history: - Lives at home with her husband.  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  She is non-smoker. - Her mother and sister had thyroid problems but no history of malignancies.   PLAN:  PT 2 PN 1A medullary thyroid carcinoma: - We have reviewed results of 24-hour urine metanephrines and plasma metanephrines which were normal. - I have reviewed bone scan images with the patient from 04/22/2021 which showed punctate area of increased intake projected over right  posterior 12th rib.  This could also represent renal activity. - Recommend right-sided rib series. - Reviewed MRI of the abdomen with and without contrast from 04/16/2021.  No suspicious liver lesion was seen.  Renal lesions were seen. - She does not have any evidence of metastatic disease from medullary thyroid carcinoma. - Recommend repeating CEA and calcitonin. - If they continue to be high, consider extended lymph node dissection by Dr. Harlow Asa. - We will also send genetic testing. - We discussed NGS test results which did not show any evidence of targetable mutations. - RTC 3 to 4 weeks for follow-up.  2.  Right kidney lesion: - MRI showed lower pole right renal lesion favored to represent cystic renal cell carcinoma, Bosniak 4.  An adjacent area of more central precontrast T1 hyperintensity may represent separate hemorrhagic cyst.  Central upper pole right renal lesion is favored to represent a complex renal sinus cyst. - We will plan to repeat MRI abdomen with and without contrast in 6 months.   Orders placed this encounter:  No orders of the defined types were placed in this encounter.    Derek Jack, MD [place] [phone]   I, Thana Ates, am acting as a scribe for Dr. Derek Jack.  I, Derek Jack MD, have reviewed the above documentation for accuracy and completeness, and I agree with the above.

## 2021-04-29 ENCOUNTER — Inpatient Hospital Stay (HOSPITAL_BASED_OUTPATIENT_CLINIC_OR_DEPARTMENT_OTHER): Payer: Medicare HMO | Admitting: Hematology

## 2021-04-29 ENCOUNTER — Ambulatory Visit (HOSPITAL_COMMUNITY)
Admission: RE | Admit: 2021-04-29 | Discharge: 2021-04-29 | Disposition: A | Discharge status: Home / Self Care | Payer: Medicare HMO | Source: Ambulatory Visit | Attending: Hematology | Admitting: Hematology

## 2021-04-29 ENCOUNTER — Inpatient Hospital Stay (HOSPITAL_COMMUNITY): Payer: Medicare HMO

## 2021-04-29 ENCOUNTER — Other Ambulatory Visit: Payer: Self-pay

## 2021-04-29 VITALS — BP 149/74 | HR 65 | Temp 98.4°F | Resp 18 | Wt 156.4 lb

## 2021-04-29 DIAGNOSIS — M47814 Spondylosis without myelopathy or radiculopathy, thoracic region: Secondary | ICD-10-CM | POA: Diagnosis not present

## 2021-04-29 DIAGNOSIS — Z23 Encounter for immunization: Secondary | ICD-10-CM | POA: Diagnosis not present

## 2021-04-29 DIAGNOSIS — Z08 Encounter for follow-up examination after completed treatment for malignant neoplasm: Secondary | ICD-10-CM | POA: Diagnosis not present

## 2021-04-29 DIAGNOSIS — Z85528 Personal history of other malignant neoplasm of kidney: Secondary | ICD-10-CM | POA: Diagnosis not present

## 2021-04-29 DIAGNOSIS — C73 Malignant neoplasm of thyroid gland: Secondary | ICD-10-CM | POA: Insufficient documentation

## 2021-04-29 DIAGNOSIS — Z85850 Personal history of malignant neoplasm of thyroid: Secondary | ICD-10-CM | POA: Diagnosis not present

## 2021-04-29 DIAGNOSIS — M4184 Other forms of scoliosis, thoracic region: Secondary | ICD-10-CM | POA: Diagnosis not present

## 2021-04-29 LAB — GENETIC SCREENING ORDER

## 2021-04-30 LAB — CALCITONIN: Calcitonin: 131 pg/mL — ABNORMAL HIGH (ref 0.0–5.0)

## 2021-04-30 LAB — CEA: CEA: 16.8 ng/mL — ABNORMAL HIGH (ref 0.0–4.7)

## 2021-05-03 ENCOUNTER — Ambulatory Visit: Payer: Medicare HMO | Admitting: "Endocrinology

## 2021-05-03 ENCOUNTER — Encounter: Payer: Self-pay | Admitting: "Endocrinology

## 2021-05-03 ENCOUNTER — Other Ambulatory Visit: Payer: Self-pay

## 2021-05-03 VITALS — BP 153/76 | HR 62 | Ht 64.0 in | Wt 157.4 lb

## 2021-05-03 DIAGNOSIS — C73 Malignant neoplasm of thyroid gland: Secondary | ICD-10-CM | POA: Diagnosis not present

## 2021-05-03 DIAGNOSIS — E890 Postprocedural hypothyroidism: Secondary | ICD-10-CM | POA: Diagnosis not present

## 2021-05-03 MED ORDER — LEVOTHYROXINE SODIUM 100 MCG PO TABS: 100.0000 ug | ORAL_TABLET | Freq: Every day | ORAL | 1 refills | Status: DC

## 2021-05-03 MED ORDER — LEVOTHYROXINE SODIUM 88 MCG PO TABS: 88.0000 ug | ORAL_TABLET | Freq: Every day | ORAL | 3 refills | Status: DC

## 2021-05-03 NOTE — Progress Notes (Signed)
05/03/2021, 3:02 PM  Endocrinology follow-up note   Subjective:    Patient ID: Theresa Buchanan, female    DOB: 16-Feb-1946, PCP Theresa Samples, PA-C   Past Medical History:  Diagnosis Date   Hypertension    Thyroid disease    Past Surgical History:  Procedure Laterality Date   ABDOMINAL HYSTERECTOMY     COLON SURGERY     THYROIDECTOMY N/A 03/26/2021   Procedure: TOTAL THYROIDECTOMY WITH LIMITED LYMPH NODE DISSECTION;  Surgeon: Armandina Gemma, MD;  Location: [facility];  Service: General;  Laterality: N/A;   TUBAL LIGATION     Social History   Socioeconomic History   Marital status: Married    Spouse name: Not on file   Number of children: Not on file   Years of education: Not on file   Highest education level: Not on file  Occupational History   Not on file  Tobacco Use   Smoking status: Never   Smokeless tobacco: Never  Vaping Use   Vaping Use: Never used  Substance and Sexual Activity   Alcohol use: Never   Drug use: Never   Sexual activity: Not on file  Other Topics Concern   Not on file  Social History Narrative   Not on file   Social Determinants of Health   Financial Resource Strain: Not on file  Food Insecurity: Not on file  Transportation Needs: Not on file  Physical Activity: Not on file  Stress: Not on file  Social Connections: Not on file   Family History  Problem Relation Age of Onset   Hypertension Mother    Thyroid disease Mother    Diabetes Mother    Outpatient Encounter Medications as of 05/03/2021  Medication Sig   aspirin EC 81 MG tablet Take 81 mg by mouth 2 (two) times a week.   calcium carbonate (TUMS) 500 MG chewable tablet Chew 2 tablets (400 mg of elemental calcium total) by mouth 2 (two) times daily.   Camphor-Menthol-Methyl Sal (SALONPAS) 3.06-21-08 % PTCH Place 1 application onto the skin daily as needed (pain).   levothyroxine (SYNTHROID) 88 MCG tablet Take 1 tablet (88  mcg total) by mouth daily before breakfast.   lisinopril-hydrochlorothiazide (ZESTORETIC) 20-25 MG tablet Take 1 tablet by mouth daily.   Polyethyl Glycol-Propyl Glycol (SYSTANE OP) Place 1 drop into both eyes 2 (two) times daily as needed (dry eyes).   traMADol (ULTRAM) 50 MG tablet Take 50 mg by mouth every 6 (six) hours as needed.   trolamine salicylate (ASPERCREME) 10 % cream Apply 1 application topically as needed for muscle pain.   [DISCONTINUED] levothyroxine (SYNTHROID) 88 MCG tablet Take 88 mcg by mouth daily.   [DISCONTINUED] levothyroxine (SYNTHROID) 100 MCG tablet Take 1 tablet (100 mcg total) by mouth daily.   [DISCONTINUED] levothyroxine (SYNTHROID) 75 MCG tablet Take 1 tablet (75 mcg total) by mouth daily before breakfast. (Patient not taking: Reported on 05/03/2021)   No facility-administered encounter medications on file as of 05/03/2021.   ALLERGIES: No Known Allergies  VACCINATION STATUS:  There is no immunization history on file for this patient.  HPI Theresa Buchanan is 75 y.o. female who presents today with a medical history as above. she follows  in this clinic for RAI induced hypothyroidism.  However, she was found to have lateral multinodular goiter recently, in February 2022. Fine-needle aspiration in June 2022 showed thyroid malignancy. She was sent for total thyroidectomy which she underwent on March 26, 2021.  She is recovering from her surgery.  Her surgical sample showed medullary thyroid cancer measuring 3.1 cm, on the right lobe. Metastatic medullary thyroid carcinoma was found in 1 lymph node on the right paratracheal groove, and 1 out of 4 positive lymph node in the central compartment. She remains on levothyroxine 75 mcg p.o. daily with good consistency.  She denies dysphagia, shortness of breath, nor voice change.   Since her last visit, she has seen her surgeon as well as medical oncologist Dr. Delton Coombes. She underwent more lab work as well as MRI  abdomen, see below.  Her labs show persistent elevated calcitonin, elevated CEA. She denies family history of thyroid malignancy, however reports various forms of thyroid dysfunction in her family members including her mother and sisters.    Presurgical chest x-ray showed 3.3 cm mass which was later said to be hamartoma.    CT scan however showed questionable renal lesion, unspecified liver lesions which are not seen on the MRI.  Review of Systems  Constitutional: + Steady weight,  no fatigue, no subjective hyperthermia, no subjective hypothermia Eyes: no blurry vision, no xerophthalmia ENT: no sore throat, no nodules palpated in throat, no dysphagia/odynophagia, no hoarseness   Objective:    Vitals with BMI 05/03/2021 04/29/2021 04/15/2021  Height 5\' 4"  - 5\' 5"   Weight 157 lbs 6 oz 156 lbs 6 oz 152 lbs 5 oz  BMI 27 93.81 01.75  Systolic 102 585 277  Diastolic 76 74 79  Pulse 62 65 66    BP (!) 153/76   Pulse 62   Ht 5\' 4"  (1.626 m)   Wt 157 lb 6.4 oz (71.4 kg)   BMI 27.02 kg/m   Wt Readings from Last 3 Encounters:  05/03/21 157 lb 6.4 oz (71.4 kg)  04/29/21 156 lb 6.4 oz (70.9 kg)  04/15/21 152 lb 5.4 oz (69.1 kg)    Physical Exam  Constitutional:  Body mass index is 27.02 kg/m.,  not in acute distress, normal state of mind Eyes: PERRLA, EOMI, no exophthalmos ENT: moist mucous membranes, palpable thyroid, no gross thyromegaly, no gross cervical lymphadenopathy   Thyroid ultrasound August 06, 2020:  Right lobe 3.1 cm with 2.1 suspicious nodule. Left lobe 3.4 cm with 1.2 cm nonsuspicious nodule.  Fine-needle aspiration on December 12, 2020 of right-sided thyroid nodule Clinical History: 2.1 cm RML  Specimen Submitted:  A. THYROID, RIGHT, FINE NEEDLE ASPIRATION:   FINAL MICROSCOPIC DIAGNOSIS:  - Malignant cells present (Bethesda category VI)   SPECIMEN ADEQUACY:  Satisfactory for evaluation    Surgical sample from March 26, 2021 FINAL MICROSCOPIC DIAGNOSIS:    A. THYROID, TOTAL THYROIDECTOMY:  - Medullary thyroid carcinoma, 3.1 cm.  - Tumor confined within the thyroid capsule.  - Margins not involved.  - One perithyroidal lymph node negative for metastatic carcinoma (0/1).  - See oncology table.   B. LYMPH NODE, RIGHT PARATRACHEAL, EXCISION:  - Metastatic medullary carcinoma in one lymph node (1/1).   C. LYMPH NODE, CENTRAL COMPARTMENT, EXCISION:  - Metastatic medullary carcinoma in one of four lymph nodes (1/4).   April 26, 2021 MRI abdomen with and without contrast:  IMPRESSION: 1. Lower pole right renal lesion is favored to represent a cystic renal cell carcinoma. Bosniak  IV (2019). An adjacent area of more central precontrast T1 hyperintensity may represent a separate hemorrhagic cyst or less likely be part of the exophytic lesion. 2. Central upper pole right renal lesion is favored to represent a complex renal sinus cyst but is indeterminate. Presuming the patient does not undergo complete nephrectomy, recommend follow-up pre and post contrast abdominal MRI at 6 months. 3. No suspicious liver lesion. 4. No evidence of abdominal metastatic disease. 5. Right lower lobe hamartoma. 6.  Aortic Atherosclerosis (ICD10-I70.0).  Assessment & Plan:   1.  Medullary thyroid carcinoma 2.  Hypothyroidism following radioiodine therapy -I discussed the surgical findings with her.   - Her surgical cytology revealed locally invasive, possibly distant metastatic medullary thyroid cancer-involving at least 2 lymph nodes in the neck.  She is status post total thyroidectomy with limited neck dissection.  In light of her persistent elevated calcitonin, CEA she may need further neck exploration.  She is encouraged to keep her appointment with Dr. Harlow Asa and her oncologist Dr. Delton Coombes.  There is no utility for I-131 thyroid remnant ablation for medullary thyroid cancer. Her previsit thyroid function tests are consistent with inadequate  replacement.  I discussed and increase her levothyroxine to 88 mcg p.o. daily before breakfast.   - We discussed about the correct intake of her thyroid hormone, on empty stomach at fasting, with water, separated by at least 30 minutes from breakfast and other medications,  and separated by more than 4 hours from calcium, iron, multivitamins, acid reflux medications (PPIs). -Patient is made aware of the fact that thyroid hormone replacement is needed for life, dose to be adjusted by periodic monitoring of thyroid function tests.   On a later date, she will be approached for further investigation about family history of medullary thyroid cancer.    - she is advised to maintain close follow up with Theresa Samples, PA-C for primary care needs.   I spent 31 minutes in the care of the patient today including review of labs from Thyroid Function, CMP, and other relevant labs ; imaging/biopsy records (current and previous including abstractions from other facilities); face-to-face time discussing  her lab results and symptoms, medications doses, her options of short and long term treatment based on the latest standards of care / guidelines;   and documenting the encounter.  Jenny Reichmann Zurn  participated in the discussions, expressed understanding, and voiced agreement with the above plans.  All questions were answered to her satisfaction. she is encouraged to contact clinic should she have any questions or concerns prior to her return visit.    Follow up plan: Return in about 3 months (around 08/03/2021) for F/U with Pre-visit Labs.   Glade Lloyd, MD [facility] [address] [place], [place] [address] Phone: [phone]  Fax: [phone]     05/03/2021, 3:02 PM  This note was partially dictated with voice recognition software. Similar sounding words can be transcribed inadequately or may not  be corrected upon review.

## 2021-05-23 ENCOUNTER — Telehealth (HOSPITAL_COMMUNITY): Payer: Medicare HMO | Admitting: Genetic Counselor

## 2021-06-03 NOTE — Progress Notes (Signed)
[place], [place] [address]   CLINIC:  Medical Oncology/Hematology  PCP:  Scherrie Bateman [address] / [place] [address] [phone]   REASON FOR VISIT:  Follow-up for medullary thyroid carcinoma  PRIOR THERAPY: Total thyroidectomy with limited central compartment lymph node dissection by Dr. Harlow Asa on 03/26/2021  NGS Results: not done  CURRENT THERAPY: surveillance  BRIEF ONCOLOGIC HISTORY:  Oncology History   No history exists.    CANCER STAGING:  Cancer Staging  Medullary thyroid carcinoma [facility]) Staging form: Thyroid - Medullary, AJCC 8th Edition - Clinical stage from 04/15/2021: Stage IVC (cT2, cN1a, cM1) - Unsigned   INTERVAL HISTORY:  Ms. DHANVI BOESEN, a 75 y.o. female, returns for routine follow-up of her medullary thyroid carcinoma. Dezaria was last seen on 04/29/2021.  Today she reports feeling good. She denies feeling any lumps.   REVIEW OF SYSTEMS:  Review of Systems  Constitutional:  Positive for fatigue (25%). Negative for appetite change (50%).  HENT:   Negative for lump/mass.   Musculoskeletal:  Positive for arthralgias (8/10 L shoulder).  All other systems reviewed and are negative.  PAST MEDICAL/SURGICAL HISTORY:  Past Medical History:  Diagnosis Date   Hypertension    Thyroid disease    Past Surgical History:  Procedure Laterality Date   ABDOMINAL HYSTERECTOMY     COLON SURGERY     THYROIDECTOMY N/A 03/26/2021   Procedure: TOTAL THYROIDECTOMY WITH LIMITED LYMPH NODE DISSECTION;  Surgeon: Armandina Gemma, MD;  Location: [facility];  Service: General;  Laterality: N/A;   TUBAL LIGATION      SOCIAL HISTORY:  Social History   Socioeconomic History   Marital status: Married    Spouse name: Not on file   Number of children: Not on file   Years of education: Not on file   Highest education level: Not on file  Occupational History   Not on file  Tobacco Use   Smoking status:  Never   Smokeless tobacco: Never  Vaping Use   Vaping Use: Never used  Substance and Sexual Activity   Alcohol use: Never   Drug use: Never   Sexual activity: Not on file  Other Topics Concern   Not on file  Social History Narrative   Not on file   Social Determinants of Health   Financial Resource Strain: Not on file  Food Insecurity: Not on file  Transportation Needs: Not on file  Physical Activity: Not on file  Stress: Not on file  Social Connections: Not on file  Intimate Partner Violence: Not on file    FAMILY HISTORY:  Family History  Problem Relation Age of Onset   Hypertension Mother    Thyroid disease Mother    Diabetes Mother     CURRENT MEDICATIONS:  Current Outpatient Medications  Medication Sig Dispense Refill   aspirin EC 81 MG tablet Take 81 mg by mouth 2 (two) times a week.     calcium carbonate (TUMS) 500 MG chewable tablet Chew 2 tablets (400 mg of elemental calcium total) by mouth 2 (two) times daily. 90 tablet 1   Camphor-Menthol-Methyl Sal (SALONPAS) 3.06-21-08 % PTCH Place 1 application onto the skin daily as needed (pain).     levothyroxine (SYNTHROID) 100 MCG tablet Take 100 mcg by mouth daily.     lisinopril-hydrochlorothiazide (ZESTORETIC) 20-25 MG tablet Take 1 tablet by mouth daily.     Polyethyl Glycol-Propyl Glycol (SYSTANE OP) Place 1 drop into both eyes  2 (two) times daily as needed (dry eyes).     traMADol (ULTRAM) 50 MG tablet Take 50 mg by mouth every 6 (six) hours as needed.     trolamine salicylate (ASPERCREME) 10 % cream Apply 1 application topically as needed for muscle pain.     No current facility-administered medications for this visit.    ALLERGIES:  No Known Allergies  PHYSICAL EXAM:  Performance status (ECOG): 1 - Symptomatic but completely ambulatory  Vitals:   06/04/21 1031  BP: (!) 144/92  Pulse: 94  Resp: 16  Temp: 97.9 F (36.6 C)  SpO2: 100%   Wt Readings from Last 3 Encounters:  06/04/21 156 lb (70.8 kg)   05/03/21 157 lb 6.4 oz (71.4 kg)  04/29/21 156 lb 6.4 oz (70.9 kg)   Physical Exam Vitals reviewed.  Constitutional:      Appearance: Normal appearance.  Cardiovascular:     Rate and Rhythm: Normal rate and regular rhythm.     Pulses: Normal pulses.     Heart sounds: Normal heart sounds.  Pulmonary:     Effort: Pulmonary effort is normal.     Breath sounds: Normal breath sounds.  Lymphadenopathy:     Cervical: No cervical adenopathy.     Right cervical: No superficial, deep or posterior cervical adenopathy.    Left cervical: No superficial, deep or posterior cervical adenopathy.  Neurological:     General: No focal deficit present.     Mental Status: She is alert and oriented to person, place, and time.  Psychiatric:        Mood and Affect: Mood normal.        Behavior: Behavior normal.     LABORATORY DATA:  I have reviewed the labs as listed.  CBC Latest Ref Rng & Units 03/27/2021 03/21/2021  WBC 4.0 - 10.5 K/uL 8.7 5.3  Hemoglobin 12.0 - 15.0 g/dL 11.9(L) 13.0  Hematocrit 36.0 - 46.0 % 35.3(L) 39.4  Platelets 150 - 400 K/uL 293 313   CMP Latest Ref Rng & Units 03/27/2021 03/21/2021  Glucose 70 - 99 mg/dL 90 95  BUN 8 - 23 mg/dL 18 20  Creatinine 0.44 - 1.00 mg/dL 0.97 0.90  Sodium 135 - 145 mmol/L 136 135  Potassium 3.5 - 5.1 mmol/L 3.1(L) 3.3(L)  Chloride 98 - 111 mmol/L 99 99  CO2 22 - 32 mmol/L 27 28  Calcium 8.9 - 10.3 mg/dL 9.4 9.9    DIAGNOSTIC IMAGING:  I have independently reviewed the scans and discussed with the patient. No results found.   ASSESSMENT:  Medullary thyroid carcinoma: - History of RAI induced hypothyroidism for the last 20 years. - FNA of multinodular goiter in June 2022 showed positive for malignancy. - Total thyroidectomy with limited central compartment lymph node dissection by Dr. Harlow Asa on 03/26/2021 - Pathology shows medullary thyroid cancer, 3.1 x 1.5 x 1.5 cm, tumor confined within the thyroid capsule, margins negative.  0/1  perithyroidal lymph node negative.  1/1 metastatic medullary carcinoma in the right paratracheal lymph node.  1/4 metastatic lymph node in the central compartment.  pT2, PN1a. - CT CAP with contrast on 04/09/2021 showed hypodense hepatic lesions, very small seen throughout the right hepatic lobe, some of which are low-density.  Heterogeneous enhancing lesion in the lower pole of the right kidney measuring 18 x 14 mm.  Second area in the medial right kidney measuring 18 x 18 mm.  8 mm solid-appearing lesion in the inferior right kidney lower pole.  Pulmonary hamartoma  in the right lower lobe measuring 3 cm. - Labs on 04/03/2021 with calcitonin 136 and CEA 62.9. - NGS testing on 04/25/2021-BRAF negative, RET fusion not detected.  MSI-stable.  MMR-proficient.  [place] 1/2/3 not detected.  TMB low.  PD-L1 is negative.  HRAS pathogenic variant exon 3. - 24-hour urine metanephrines and plasma metanephrines normal. - Bone scan from 04/22/2021 showed punctate area of increased uptake projected over right posterior 12th rib, can also represent renal activity.    Family/social history: - Lives at home with her husband.  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  She is non-smoker. - Her mother and sister had thyroid problems but no history of malignancies.   PLAN:  PT 2 PN 1A medullary thyroid carcinoma: - We have reviewed results of 24-hour urine metanephrines and plasma metanephrines which were normal. - I have reviewed bone scan images with the patient from 04/22/2021 which showed punctate area of increased intake projected over right posterior 12th rib.  This could also represent renal activity. - Rib series on the right side did not show any acute or focal rib abnormality.  This has confirmed renal activity on bone scan. - We have reviewed latest calcitonin which was high at 131.  CEA has improved to 16.8. - We will plan to repeat tumor markers end of January.  If they continue to  be high, she will need neck dissection. - We will also make another appointment for genetic testing.   2.  Right kidney lesion: - MRI on 04/16/2021 showed lower pole right renal lesion favored to represent cystic renal cell carcinoma. - We will plan to repeat MRI of the abdomen with and without contrast in 6 months.   Orders placed this encounter:  No orders of the defined types were placed in this encounter.    Derek Jack, MD [place] [facility] [phone]   I, Thana Ates, am acting as a scribe for Dr. Derek Jack.  I, Derek Jack MD, have reviewed the above documentation for accuracy and completeness, and I agree with the above.

## 2021-06-04 ENCOUNTER — Inpatient Hospital Stay (HOSPITAL_COMMUNITY): Payer: Medicare HMO | Attending: Hematology | Admitting: Hematology

## 2021-06-04 ENCOUNTER — Other Ambulatory Visit: Payer: Self-pay

## 2021-06-04 ENCOUNTER — Other Ambulatory Visit (HOSPITAL_COMMUNITY): Payer: Self-pay | Admitting: *Deleted

## 2021-06-04 VITALS — BP 144/92 | HR 94 | Temp 97.9°F | Resp 16 | Ht 65.0 in | Wt 156.0 lb

## 2021-06-04 DIAGNOSIS — Q859 Phakomatosis, unspecified: Secondary | ICD-10-CM | POA: Insufficient documentation

## 2021-06-04 DIAGNOSIS — I10 Essential (primary) hypertension: Secondary | ICD-10-CM | POA: Diagnosis not present

## 2021-06-04 DIAGNOSIS — E039 Hypothyroidism, unspecified: Secondary | ICD-10-CM | POA: Diagnosis not present

## 2021-06-04 DIAGNOSIS — Z90710 Acquired absence of both cervix and uterus: Secondary | ICD-10-CM | POA: Diagnosis not present

## 2021-06-04 DIAGNOSIS — N289 Disorder of kidney and ureter, unspecified: Secondary | ICD-10-CM | POA: Diagnosis not present

## 2021-06-04 DIAGNOSIS — C73 Malignant neoplasm of thyroid gland: Secondary | ICD-10-CM

## 2021-06-04 NOTE — Patient Instructions (Signed)
[place] at [facility] Discharge Instructions   You were seen and examined today by Dr. Delton Coombes. He reviewed the results of your lab work - your cancer markers are still high. We will see you back at the end of January with repeat lab work. Keep Genetics appointment as scheduled.   Thank you for choosing [place] at [facility] to provide your oncology and hematology care.  To afford each patient quality time with our provider, please arrive at least 15 minutes before your scheduled appointment time.   If you have a lab appointment with the [facility] please come in thru the Main Entrance and check in at the main information desk.  You need to re-schedule your appointment should you arrive 10 or more minutes late.  We strive to give you quality time with our providers, and arriving late affects you and other patients whose appointments are after yours.  Also, if you no show three or more times for appointments you may be dismissed from the clinic at the providers discretion.     Again, thank you for choosing [facility].  Our hope is that these requests will decrease the amount of time that you wait before being seen by our physicians.       _____________________________________________________________  Should you have questions after your visit to [facility], please contact our office at [phone] and follow the prompts.  Our office hours are 8:00 a.m. and 4:30 p.m. Monday - Friday.  Please note that voicemails left after 4:00 p.m. may not be returned until the following business day.  We are closed weekends and major holidays.  You do have access to a nurse 24-7, just call the main number to the clinic [phone] and do not press any options, hold on the line and a nurse will answer the phone.    For prescription refill requests, have your pharmacy contact our office and allow 72 hours.    Due to Covid,  you will need to wear a mask upon entering the hospital. If you do not have a mask, a mask will be given to you at the Main Entrance upon arrival. For doctor visits, patients may have 1 support person age 29 or older with them. For treatment visits, patients can not have anyone with them due to social distancing guidelines and our immunocompromised population.

## 2021-06-27 ENCOUNTER — Encounter: Payer: Self-pay | Admitting: Licensed Clinical Social Worker

## 2021-06-27 ENCOUNTER — Other Ambulatory Visit: Payer: Self-pay

## 2021-06-27 ENCOUNTER — Inpatient Hospital Stay (HOSPITAL_COMMUNITY): Payer: Medicare HMO | Attending: Hematology | Admitting: Licensed Clinical Social Worker

## 2021-06-27 ENCOUNTER — Ambulatory Visit: Payer: Self-pay | Admitting: Licensed Clinical Social Worker

## 2021-06-27 ENCOUNTER — Encounter (HOSPITAL_COMMUNITY): Payer: Self-pay | Admitting: Licensed Clinical Social Worker

## 2021-06-27 ENCOUNTER — Telehealth: Payer: Self-pay | Admitting: Licensed Clinical Social Worker

## 2021-06-27 DIAGNOSIS — M545 Low back pain, unspecified: Secondary | ICD-10-CM | POA: Insufficient documentation

## 2021-06-27 DIAGNOSIS — Z90710 Acquired absence of both cervix and uterus: Secondary | ICD-10-CM | POA: Insufficient documentation

## 2021-06-27 DIAGNOSIS — Z808 Family history of malignant neoplasm of other organs or systems: Secondary | ICD-10-CM | POA: Diagnosis not present

## 2021-06-27 DIAGNOSIS — N289 Disorder of kidney and ureter, unspecified: Secondary | ICD-10-CM | POA: Insufficient documentation

## 2021-06-27 DIAGNOSIS — C73 Malignant neoplasm of thyroid gland: Secondary | ICD-10-CM | POA: Diagnosis not present

## 2021-06-27 DIAGNOSIS — I10 Essential (primary) hypertension: Secondary | ICD-10-CM | POA: Insufficient documentation

## 2021-06-27 DIAGNOSIS — R978 Other abnormal tumor markers: Secondary | ICD-10-CM | POA: Insufficient documentation

## 2021-06-27 DIAGNOSIS — Z809 Family history of malignant neoplasm, unspecified: Secondary | ICD-10-CM | POA: Insufficient documentation

## 2021-06-27 DIAGNOSIS — Z1379 Encounter for other screening for genetic and chromosomal anomalies: Secondary | ICD-10-CM | POA: Insufficient documentation

## 2021-06-27 DIAGNOSIS — M25559 Pain in unspecified hip: Secondary | ICD-10-CM | POA: Insufficient documentation

## 2021-06-27 NOTE — Progress Notes (Signed)
HPI:  Ms. Mcneely was previously seen in the [facility] due to a personal history of medullary thyroid carcinoma and concerns regarding a hereditary predisposition to cancer. Please refer to our prior cancer genetics clinic note for more information regarding our discussion, assessment and recommendations, at the time. Ms. Clover's recent genetic test results were disclosed to her, as were recommendations warranted by these results. These results and recommendations are discussed in more detail below.  CANCER HISTORY:  Oncology History  Medullary thyroid carcinoma ([facility])  03/17/2021 Initial Diagnosis   Medullary thyroid carcinoma ([facility])    Genetic Testing   Negative genetic testing. No pathogenic variants identified on the [facility] Multi-Cancer+RNA panel. VUS in SUFU called c.865C>T identified. The report date is 05/17/2021.  The Multi-Cancer Panel + RNA offered by [facility] includes sequencing and/or deletion duplication testing of the following 84 genes: AIP, ALK, APC, ATM, AXIN2,BAP1,  BARD1, BLM, BMPR1A, BRCA1, BRCA2, BRIP1, CASR, CDC73, CDH1, CDK4, CDKN1B, CDKN1C, CDKN2A (p14ARF), CDKN2A (p16INK4a), CEBPA, CHEK2, CTNNA1, DICER1, DIS3L2, EGFR (c.2369C>T, p.Thr790Met variant only), EPCAM (Deletion/duplication testing only), FH, FLCN, GATA2, GPC3, GREM1 (Promoter region deletion/duplication testing only), HOXB13 (c.251G>A, p.Gly84Glu), HRAS, KIT, MAX, MEN1, MET, MITF (c.952G>A, p.Glu318Lys variant only), MLH1, MSH2, MSH3, MSH6, MUTYH, NBN, NF1, NF2, NTHL1, PALB2, PDGFRA, PHOX2B, PMS2, POLD1, POLE, POT1, PRKAR1A, PTCH1, PTEN, RAD50, RAD51C, RAD51D, RB1, RECQL4, RET, RUNX1, SDHAF2, SDHA (sequence changes only), SDHB, SDHC, SDHD, SMAD4, SMARCA4, SMARCB1, SMARCE1, STK11, SUFU, TERC, TERT, TMEM127, TP53, TSC1, TSC2, VHL, WRN and WT1.     FAMILY HISTORY:  We obtained a detailed, 4-generation family history.  Significant diagnoses are listed below: Family History  Problem Relation Age of  Onset   Hypertension Mother    Thyroid disease Mother    Diabetes Mother    Cancer Maternal Aunt        unk types   Cancer Maternal Uncle        unk types    Ms. Priego has 2 sons (20 and 21) and 1 daughter (61). She has 4 maternal half brothers, 5 maternal half sisters, no history of cancer.   Ms. Sirianni's mother passed at 62 with no cancer history. Patient had 2 maternal aunts, 2 maternal uncles, all had cancer but patient does not know type. No known cancers in cousins or grandparents.   Ms. Arline's father died in his 1s-60s. She has no other information about his side of the family.   Ms. Clum is unaware of previous family history of genetic testing for hereditary cancer risks.There is no reported Ashkenazi Jewish ancestry. There is no known consanguinity.    GENETIC TEST RESULTS: Genetic testing reported out on 05/17/2021 through the [facility] Multi-Cancer+RNA cancer panel found no pathogenic mutations.   The Multi-Cancer Panel + RNA offered by [facility] includes sequencing and/or deletion duplication testing of the following 84 genes: AIP, ALK, APC, ATM, AXIN2,BAP1,  BARD1, BLM, BMPR1A, BRCA1, BRCA2, BRIP1, CASR, CDC73, CDH1, CDK4, CDKN1B, CDKN1C, CDKN2A (p14ARF), CDKN2A (p16INK4a), CEBPA, CHEK2, CTNNA1, DICER1, DIS3L2, EGFR (c.2369C>T, p.Thr790Met variant only), EPCAM (Deletion/duplication testing only), FH, FLCN, GATA2, GPC3, GREM1 (Promoter region deletion/duplication testing only), HOXB13 (c.251G>A, p.Gly84Glu), HRAS, KIT, MAX, MEN1, MET, MITF (c.952G>A, p.Glu318Lys variant only), MLH1, MSH2, MSH3, MSH6, MUTYH, NBN, NF1, NF2, NTHL1, PALB2, PDGFRA, PHOX2B, PMS2, POLD1, POLE, POT1, PRKAR1A, PTCH1, PTEN, RAD50, RAD51C, RAD51D, RB1, RECQL4, RET, RUNX1, SDHAF2, SDHA (sequence changes only), SDHB, SDHC, SDHD, SMAD4, SMARCA4, SMARCB1, SMARCE1, STK11, SUFU, TERC, TERT, TMEM127, TP53, TSC1, TSC2, VHL, WRN and WT1.   The  test report has been scanned into EPIC and is located under the Molecular  Pathology section of the Results Review tab.  A portion of the result report is included below for reference.     We discussed that because current genetic testing is not perfect, it is possible there may be a gene mutation in one of these genes that current testing cannot detect, but that chance is small.  There could be another gene that has not yet been discovered, or that we have not yet tested, that is responsible for the cancer diagnoses in the family. It is also possible there is a hereditary cause for the cancer in the family that Ms. Vitiello did not inherit and therefore was not identified in her testing.  Therefore, it is important to remain in touch with cancer genetics in the future so that we can continue to offer Ms. Redmann the most up to date genetic testing.   Genetic testing did identify a variant of uncertain significance (VUS) in the SUFU gene called c.865C>T.  At this time, it is unknown if this variant is associated with increased cancer risk or if this is a normal finding, but most variants such as this get reclassified to being inconsequential. It should not be used to make medical management decisions. With time, we suspect the lab will determine the significance of this variant, if any. If we do learn more about it we will try to contact Ms. Loeper to discuss it further. However, it is important to stay in touch with [place] periodically and keep the address and phone number up to date.  ADDITIONAL GENETIC TESTING: We discussed with Ms. Nader that her genetic testing was fairly extensive.  If there are genes identified to increase cancer risk that can be analyzed in the future, we would be happy to discuss and coordinate this testing at that time.    CANCER SCREENING RECOMMENDATIONS: Ms. Espinoza's test result is considered negative (normal).  This means that we have not identified a hereditary cause for her  personal and family history of cancer at this time. Most cancers happen by chance and this  negative test suggests that her cancer may fall into this category.    While reassuring, this does not definitively rule out a hereditary predisposition to cancer. It is still possible that there could be genetic mutations that are undetectable by current technology. There could be genetic mutations in genes that have not been tested or identified to increase cancer risk.  Therefore, it is recommended she continue to follow the cancer management and screening guidelines provided by her oncology and primary healthcare provider.   An individual's cancer risk and medical management are not determined by genetic test results alone. Overall cancer risk assessment incorporates additional factors, including personal medical history, family history, and any available genetic information that may result in a personalized plan for cancer prevention and surveillance.  RECOMMENDATIONS FOR FAMILY MEMBERS:  Relatives in this family might be at some increased risk of developing cancer, over the general population risk, simply due to the family history of cancer.  We recommended female relatives in this family have a yearly mammogram beginning at age 77, or 71 years younger than the earliest onset of cancer, an annual clinical breast exam, and perform monthly breast self-exams. Female relatives in this family should also have a gynecological exam as recommended by their primary provider.  All family members should be referred for colonoscopy starting at age 58.   FOLLOW-UP: Lastly,  we discussed with Ms. Saxton that cancer genetics is a rapidly advancing field and it is possible that new genetic tests will be appropriate for her and/or her family members in the future. We encouraged her to remain in contact with cancer genetics on an annual basis so we can update her personal and family histories and let her know of advances in cancer genetics that may benefit this family.   Our contact number was provided. Ms. Kooi's  questions were answered to her satisfaction, and she knows she is welcome to call us at anytime with additional questions or concerns.   Faith Rogue, MS, [facility] Genetic Counselor [email or web address] Phone: [phone]

## 2021-06-27 NOTE — Telephone Encounter (Signed)
Revealed negative genetic testing.  Revealed that a VUS in SUFU was identified. This normal result is reassuring and indicates that it is unlikely Theresa Buchanan's cancer is due to a hereditary cause.  It is unlikely that there is an increased risk of another cancer due to a mutation in one of these genes.  However, genetic testing is not perfect, and cannot definitively rule out a hereditary cause.  It will be important for her to keep in contact with genetics to learn if any additional testing may be needed in the future.

## 2021-06-27 NOTE — Progress Notes (Signed)
REFERRING PROVIDER: Derek Jack, MD [address] [place],  [place] [address]  PRIMARY PROVIDER:  Jake Samples, PA-C  PRIMARY REASON FOR VISIT:  1. Medullary thyroid carcinoma ([facility])   2. Family history of cancer      HISTORY OF PRESENT ILLNESS:   [name], a 76 y.o. female, was seen for a [facility] cancer genetics consultation at the request of Dr. Delton Coombes due to a personal and family history of cancer.  Ms. Butzer presents to clinic today to discuss the possibility of a hereditary predisposition to cancer, genetic testing, and to further clarify her future cancer risks, as well as potential cancer risks for family members.   In 2022, at the age of 62, [name] was diagnosed with medullary thyroid carcinoma.  She had a total thyroidectomy on 03/26/2021.   CANCER HISTORY:  Oncology History   No history exists.     RISK FACTORS:  Menarche was at age 11.  First live birth at age 74.  OCP use for approximately 4 years.  Ovaries intact: yes.  Hysterectomy: yes.  Menopausal status: postmenopausal.  HRT use: 0 years. Colonoscopy: yes; polyps in 2010.   Past Medical History:  Diagnosis Date   Family history of cancer    Hypertension    Thyroid disease     Past Surgical History:  Procedure Laterality Date   ABDOMINAL HYSTERECTOMY     COLON SURGERY     THYROIDECTOMY N/A 03/26/2021   Procedure: TOTAL THYROIDECTOMY WITH LIMITED LYMPH NODE DISSECTION;  Surgeon: Armandina Gemma, MD;  Location: [facility];  Service: General;  Laterality: N/A;   TUBAL LIGATION      Social History   Socioeconomic History   Marital status: Married    Spouse name: Not on file   Number of children: Not on file   Years of education: Not on file   Highest education level: Not on file  Occupational History   Not on file  Tobacco Use   Smoking status: Never   Smokeless tobacco: Never  Vaping Use   Vaping Use: Never used  Substance and Sexual Activity   Alcohol use: Never   Drug use:  Never   Sexual activity: Not on file  Other Topics Concern   Not on file  Social History Narrative   Not on file   Social Determinants of Health   Financial Resource Strain: Not on file  Food Insecurity: Not on file  Transportation Needs: Not on file  Physical Activity: Not on file  Stress: Not on file  Social Connections: Not on file     FAMILY HISTORY:  We obtained a detailed, 4-generation family history.  Significant diagnoses are listed below: Family History  Problem Relation Age of Onset   Hypertension Mother    Thyroid disease Mother    Diabetes Mother    Cancer Maternal Aunt        unk types   Cancer Maternal Uncle        unk types   Ms. Cockerell has 2 sons (14 and 20) and 1 daughter (49). She has 4 maternal half brothers, 5 maternal half sisters, no history of cancer.  Ms. Balbach's mother passed at 14 with no cancer history. Patient had 2 maternal aunts, 2 maternal uncles, all had cancer but patient does not know type. No known cancers in cousins or grandparents.  Ms. Renfro's father died in his 1s-60s. She has no other information about his side of the family.  Ms. Wisdom is  unaware of previous family history of genetic testing for hereditary cancer risks.There is no reported Ashkenazi Jewish ancestry. There is no known consanguinity.    GENETIC COUNSELING ASSESSMENT: [name] is a 76 y.o. female with a personal history of medullary thyroid carcinoma which is somewhat suggestive of a hereditary cancer syndrome and predisposition to cancer. We, therefore, discussed and recommended the following at today's visit.   DISCUSSION: MEN2:  Medullary Thyroid Cancer (MTC) is a relatively rare thyroid cancer, making up approximately 10% of all thyroid cancer.  Approximately 25% of MTC are the result of either multiple endocrine neoplasia type 2A (MEN2A), MEN2B or Familial Medullary Thyroid Cancer (FMTC).  All of these conditions are caused by genetic changes in the RET gene.  At this  time, RET is the only gene associated with these conditions. We discussed that testing is beneficial for several reasons including knowing about other cancer risks, identifying potential screening and risk-reduction options that may be appropriate, and to understand if other family members could be at risk for cancer and allow them to undergo genetic testing.   We reviewed the characteristics, features and inheritance patterns of hereditary cancer syndromes. We also discussed genetic testing, including the appropriate family members to test, the process of testing, insurance coverage and turn-around-time for results. We discussed the implications of a negative, positive and/or variant of uncertain significant result. We recommended Ms. Athens pursue genetic testing for the [facility] Multi-Cancer+RNA gene panel.   The Multi-Cancer Panel + RNA offered by [facility] includes sequencing and/or deletion duplication testing of the following 84 genes: AIP, ALK, APC, ATM, AXIN2,BAP1,  BARD1, BLM, BMPR1A, BRCA1, BRCA2, BRIP1, CASR, CDC73, CDH1, CDK4, CDKN1B, CDKN1C, CDKN2A (p14ARF), CDKN2A (p16INK4a), CEBPA, CHEK2, CTNNA1, DICER1, DIS3L2, EGFR (c.2369C>T, p.Thr790Met variant only), EPCAM (Deletion/duplication testing only), FH, FLCN, GATA2, GPC3, GREM1 (Promoter region deletion/duplication testing only), HOXB13 (c.251G>A, p.Gly84Glu), HRAS, KIT, MAX, MEN1, MET, MITF (c.952G>A, p.Glu318Lys variant only), MLH1, MSH2, MSH3, MSH6, MUTYH, NBN, NF1, NF2, NTHL1, PALB2, PDGFRA, PHOX2B, PMS2, POLD1, POLE, POT1, PRKAR1A, PTCH1, PTEN, RAD50, RAD51C, RAD51D, RB1, RECQL4, RET, RUNX1, SDHAF2, SDHA (sequence changes only), SDHB, SDHC, SDHD, SMAD4, SMARCA4, SMARCB1, SMARCE1, STK11, SUFU, TERC, TERT, TMEM127, TP53, TSC1, TSC2, VHL, WRN and WT1.  Based on Ms. Clingerman's personal history of cancer, she meets medical criteria for genetic testing. Despite that she meets criteria, she may still have an out of pocket cost. We discussed that if her out  of pocket cost for testing is over $100, the laboratory will call and confirm whether she wants to proceed with testing.  If the out of pocket cost of testing is less than $100 she will be billed by the genetic testing laboratory.   PLAN: After considering the risks, benefits, and limitations, Ms. Lamb did not wish to pursue genetic testing at today's visit. She would like to think about it more first. We understand this decision and remain available to coordinate genetic testing at any time in the future. We, therefore, recommend Ms. Willner continue to follow the cancer screening guidelines given by her primary healthcare provider.  Ms. Semper's questions were answered to her satisfaction today. Our contact information was provided should additional questions or concerns arise. Thank you for the referral and allowing [place] to share in the care of your patient.   Faith Rogue, MS, [facility] Genetic Counselor [email or web address] Phone: [phone]  The patient was seen for a total of 25 minutes in virtual genetic counseling.  Patient was seen alone. Dr. Grayland Ormond was available for discussion regarding  this case.   _______________________________________________________________________ For Office Staff:  Number of people involved in session: 1 Was an Intern/ student involved with case: no

## 2021-06-27 NOTE — Progress Notes (Signed)
Update: Patient actually had genetic testing already completed. Patient was called to discuss.

## 2021-07-09 ENCOUNTER — Other Ambulatory Visit: Payer: Self-pay

## 2021-07-09 ENCOUNTER — Inpatient Hospital Stay (HOSPITAL_COMMUNITY): Payer: Medicare HMO

## 2021-07-09 DIAGNOSIS — Z90710 Acquired absence of both cervix and uterus: Secondary | ICD-10-CM | POA: Diagnosis not present

## 2021-07-09 DIAGNOSIS — R978 Other abnormal tumor markers: Secondary | ICD-10-CM | POA: Diagnosis not present

## 2021-07-09 DIAGNOSIS — M545 Low back pain, unspecified: Secondary | ICD-10-CM | POA: Diagnosis not present

## 2021-07-09 DIAGNOSIS — M25559 Pain in unspecified hip: Secondary | ICD-10-CM | POA: Diagnosis not present

## 2021-07-09 DIAGNOSIS — C73 Malignant neoplasm of thyroid gland: Secondary | ICD-10-CM

## 2021-07-09 DIAGNOSIS — Z809 Family history of malignant neoplasm, unspecified: Secondary | ICD-10-CM | POA: Diagnosis not present

## 2021-07-09 DIAGNOSIS — I10 Essential (primary) hypertension: Secondary | ICD-10-CM | POA: Diagnosis not present

## 2021-07-09 DIAGNOSIS — N289 Disorder of kidney and ureter, unspecified: Secondary | ICD-10-CM | POA: Diagnosis not present

## 2021-07-10 LAB — CALCITONIN: Calcitonin: 119 pg/mL — ABNORMAL HIGH (ref 0.0–5.0)

## 2021-07-10 LAB — CEA: CEA: 4.5 ng/mL (ref 0.0–4.7)

## 2021-07-16 ENCOUNTER — Other Ambulatory Visit: Payer: Self-pay

## 2021-07-16 ENCOUNTER — Inpatient Hospital Stay (HOSPITAL_BASED_OUTPATIENT_CLINIC_OR_DEPARTMENT_OTHER): Payer: Medicare HMO | Admitting: Hematology

## 2021-07-16 VITALS — BP 132/72 | HR 87 | Temp 97.8°F | Resp 18 | Ht 65.0 in | Wt 156.0 lb

## 2021-07-16 DIAGNOSIS — Z809 Family history of malignant neoplasm, unspecified: Secondary | ICD-10-CM

## 2021-07-16 DIAGNOSIS — R978 Other abnormal tumor markers: Secondary | ICD-10-CM | POA: Diagnosis not present

## 2021-07-16 DIAGNOSIS — N289 Disorder of kidney and ureter, unspecified: Secondary | ICD-10-CM

## 2021-07-16 DIAGNOSIS — I10 Essential (primary) hypertension: Secondary | ICD-10-CM

## 2021-07-16 DIAGNOSIS — M25559 Pain in unspecified hip: Secondary | ICD-10-CM | POA: Diagnosis not present

## 2021-07-16 DIAGNOSIS — Z90710 Acquired absence of both cervix and uterus: Secondary | ICD-10-CM | POA: Diagnosis not present

## 2021-07-16 DIAGNOSIS — C73 Malignant neoplasm of thyroid gland: Secondary | ICD-10-CM

## 2021-07-16 DIAGNOSIS — M545 Low back pain, unspecified: Secondary | ICD-10-CM | POA: Diagnosis not present

## 2021-07-16 NOTE — Progress Notes (Signed)
[place], [place] [address]   CLINIC:  Medical Oncology/Hematology  PCP:  Jake Samples, PA-C [address] / [place] [address] [phone]   REASON FOR VISIT:  Follow-up for medullary thyroid carcinoma  PRIOR THERAPY: Total thyroidectomy with limited central compartment lymph node dissection by Dr. Harlow Asa on 03/26/2021  NGS Results: not done  CURRENT THERAPY: surveillance  BRIEF ONCOLOGIC HISTORY:  Oncology History  Medullary thyroid carcinoma ([facility])  03/17/2021 Initial Diagnosis   Medullary thyroid carcinoma (H[facility])    Genetic Testing   Negative genetic testing. No pathogenic variants identified on the [facility] Multi-Cancer+RNA panel. VUS in SUFU called c.865C>T identified. The report date is 05/17/2021.  The Multi-Cancer Panel + RNA offered by [facility] includes sequencing and/or deletion duplication testing of the following 84 genes: AIP, ALK, APC, ATM, AXIN2,BAP1,  BARD1, BLM, BMPR1A, BRCA1, BRCA2, BRIP1, CASR, CDC73, CDH1, CDK4, CDKN1B, CDKN1C, CDKN2A (p14ARF), CDKN2A (p16INK4a), CEBPA, CHEK2, CTNNA1, DICER1, DIS3L2, EGFR (c.2369C>T, p.Thr790Met variant only), EPCAM (Deletion/duplication testing only), FH, FLCN, GATA2, GPC3, GREM1 (Promoter region deletion/duplication testing only), HOXB13 (c.251G>A, p.Gly84Glu), HRAS, KIT, MAX, MEN1, MET, MITF (c.952G>A, p.Glu318Lys variant only), MLH1, MSH2, MSH3, MSH6, MUTYH, NBN, NF1, NF2, NTHL1, PALB2, PDGFRA, PHOX2B, PMS2, POLD1, POLE, POT1, PRKAR1A, PTCH1, PTEN, RAD50, RAD51C, RAD51D, RB1, RECQL4, RET, RUNX1, SDHAF2, SDHA (sequence changes only), SDHB, SDHC, SDHD, SMAD4, SMARCA4, SMARCB1, SMARCE1, STK11, SUFU, TERC, TERT, TMEM127, TP53, TSC1, TSC2, VHL, WRN and WT1.     CANCER STAGING:  Cancer Staging  Medullary thyroid carcinoma [facility]) Staging form: Thyroid - Medullary, AJCC 8th Edition - Clinical stage from 04/15/2021: Stage IVC (cT2, cN1a, cM1) - Unsigned   INTERVAL HISTORY:   Ms. TEASHA MURRILLO, a 76 y.o. female, returns for routine follow-up of her medullary thyroid carcinoma. Asusena was last seen on 06/04/2021.   Today she reports feeling good. She reports intermittently poor energy as well as lower back and hip pain.   REVIEW OF SYSTEMS:  Review of Systems  Constitutional:  Positive for fatigue. Negative for appetite change.  Musculoskeletal:  Positive for arthralgias (8/10 L hip) and back pain (lower).  All other systems reviewed and are negative.  PAST MEDICAL/SURGICAL HISTORY:  Past Medical History:  Diagnosis Date   Family history of cancer    Hypertension    Thyroid disease    Past Surgical History:  Procedure Laterality Date   ABDOMINAL HYSTERECTOMY     COLON SURGERY     THYROIDECTOMY N/A 03/26/2021   Procedure: TOTAL THYROIDECTOMY WITH LIMITED LYMPH NODE DISSECTION;  Surgeon: Armandina Gemma, MD;  Location: [facility];  Service: General;  Laterality: N/A;   TUBAL LIGATION      SOCIAL HISTORY:  Social History   Socioeconomic History   Marital status: Married    Spouse name: Not on file   Number of children: Not on file   Years of education: Not on file   Highest education level: Not on file  Occupational History   Not on file  Tobacco Use   Smoking status: Never   Smokeless tobacco: Never  Vaping Use   Vaping Use: Never used  Substance and Sexual Activity   Alcohol use: Never   Drug use: Never   Sexual activity: Not on file  Other Topics Concern   Not on file  Social History Narrative   Not on file   Social Determinants of Health   Financial Resource Strain: Not on file  Food Insecurity: Not on file  Transportation Needs:  Not on file  Physical Activity: Not on file  Stress: Not on file  Social Connections: Not on file  Intimate Partner Violence: Not on file    FAMILY HISTORY:  Family History  Problem Relation Age of Onset   Hypertension Mother    Thyroid disease Mother    Diabetes Mother    Cancer Maternal Aunt         unk types   Cancer Maternal Uncle        unk types    CURRENT MEDICATIONS:  Current Outpatient Medications  Medication Sig Dispense Refill   aspirin EC 81 MG tablet Take 81 mg by mouth 2 (two) times a week.     calcium carbonate (TUMS) 500 MG chewable tablet Chew 2 tablets (400 mg of elemental calcium total) by mouth 2 (two) times daily. 90 tablet 1   Camphor-Menthol-Methyl Sal (SALONPAS) 3.06-21-08 % PTCH Place 1 application onto the skin daily as needed (pain).     levothyroxine (SYNTHROID) 100 MCG tablet Take 100 mcg by mouth daily.     lisinopril-hydrochlorothiazide (ZESTORETIC) 20-25 MG tablet Take 1 tablet by mouth daily.     Polyethyl Glycol-Propyl Glycol (SYSTANE OP) Place 1 drop into both eyes 2 (two) times daily as needed (dry eyes).     traMADol (ULTRAM) 50 MG tablet Take 50 mg by mouth every 6 (six) hours as needed.     trolamine salicylate (ASPERCREME) 10 % cream Apply 1 application topically as needed for muscle pain.     No current facility-administered medications for this visit.    ALLERGIES:  No Known Allergies  PHYSICAL EXAM:  Performance status (ECOG): 1 - Symptomatic but completely ambulatory  Vitals:   07/16/21 1201  BP: 132/72  Pulse: 87  Resp: 18  Temp: 97.8 F (36.6 C)  SpO2: 100%   Wt Readings from Last 3 Encounters:  07/16/21 156 lb (70.8 kg)  06/04/21 156 lb (70.8 kg)  05/03/21 157 lb 6.4 oz (71.4 kg)   Physical Exam Vitals reviewed.  Constitutional:      Appearance: Normal appearance.  Neck:     Comments: Thyroidectomy scar within normal limits Cardiovascular:     Rate and Rhythm: Normal rate and regular rhythm.     Pulses: Normal pulses.     Heart sounds: Normal heart sounds.  Pulmonary:     Effort: Pulmonary effort is normal.     Breath sounds: Normal breath sounds.  Neurological:     General: No focal deficit present.     Mental Status: She is alert and oriented to person, place, and time.  Psychiatric:        Mood and  Affect: Mood normal.        Behavior: Behavior normal.     LABORATORY DATA:  I have reviewed the labs as listed.  CBC Latest Ref Rng & Units 03/27/2021 03/21/2021  WBC 4.0 - 10.5 K/uL 8.7 5.3  Hemoglobin 12.0 - 15.0 g/dL 11.9(L) 13.0  Hematocrit 36.0 - 46.0 % 35.3(L) 39.4  Platelets 150 - 400 K/uL 293 313   CMP Latest Ref Rng & Units 03/27/2021 03/21/2021  Glucose 70 - 99 mg/dL 90 95  BUN 8 - 23 mg/dL 18 20  Creatinine 0.44 - 1.00 mg/dL 0.97 0.90  Sodium 135 - 145 mmol/L 136 135  Potassium 3.5 - 5.1 mmol/L 3.1(L) 3.3(L)  Chloride 98 - 111 mmol/L 99 99  CO2 22 - 32 mmol/L 27 28  Calcium 8.9 - 10.3 mg/dL 9.4 9.9  DIAGNOSTIC IMAGING:  I have independently reviewed the scans and discussed with the patient. No results found.   ASSESSMENT:  Medullary thyroid carcinoma: - History of RAI induced hypothyroidism for the last 20 years. - FNA of multinodular goiter in June 2022 showed positive for malignancy. - Total thyroidectomy with limited central compartment lymph node dissection by Dr. Harlow Asa on 03/26/2021 - Pathology shows medullary thyroid cancer, 3.1 x 1.5 x 1.5 cm, tumor confined within the thyroid capsule, margins negative.  0/1 perithyroidal lymph node negative.  1/1 metastatic medullary carcinoma in the right paratracheal lymph node.  1/4 metastatic lymph node in the central compartment.  pT2, PN1a. - CT CAP with contrast on 04/09/2021 showed hypodense hepatic lesions, very small seen throughout the right hepatic lobe, some of which are low-density.  Heterogeneous enhancing lesion in the lower pole of the right kidney measuring 18 x 14 mm.  Second area in the medial right kidney measuring 18 x 18 mm.  8 mm solid-appearing lesion in the inferior right kidney lower pole.  Pulmonary hamartoma in the right lower lobe measuring 3 cm. - Labs on 04/03/2021 with calcitonin 136 and CEA 62.9. - NGS testing on 04/25/2021-BRAF negative, RET fusion not detected.  MSI-stable.  MMR-proficient.   [place] 1/2/3 not detected.  TMB low.  PD-L1 is negative.  HRAS pathogenic variant exon 3. - 24-hour urine metanephrines and plasma metanephrines normal. - Bone scan from 04/22/2021 showed punctate area of increased uptake projected over right posterior 12th rib, can also represent renal activity. - Rib series on the right side did not show any acute or focal rib abnormality.    Family/social history: - Lives at home with her husband.  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  She is non-smoker. - Her mother and sister had thyroid problems but no history of malignancies.   PLAN:  PT 2 PN 1A medullary thyroid carcinoma: - We have reviewed results of genetic testing which did not show any pathological mutations. - Reviewed tumor markers from 07/09/2021.  Calcitonin continues to be elevated at 119.  CEA has normalized. - Physical examination did not reveal any palpable masses. - We will refer her back to Dr. Harlow Asa for consideration of neck dissection. - RTC 1 month after surgery to discuss results.   2.  Right kidney lesion: - MRI on 04/16/2021 showed lower pole right renal lesion favored to represent cystic renal cell carcinoma. - She will need repeat MRI of the abdomen with and without contrast in May 2023.   Orders placed this encounter:  No orders of the defined types were placed in this encounter.    Derek Jack, MD [place] [phone]   I, Thana Ates, am acting as a scribe for Dr. Derek Jack.  I, Derek Jack MD, have reviewed the above documentation for accuracy and completeness, and I agree with the above.

## 2021-07-16 NOTE — Patient Instructions (Addendum)
[place] at [facility] Discharge Instructions   You were seen and examined today by Dr. Delton Coombes.  We checked your cancer markers.  One of them remains high.  You need to see Dr. Harlow Asa because you will require more surgery. Please make an appointment to see him as soon as possible.   We also found a spot on your kidney.  This is something we will need to monitor periodically with scans.   Return as scheduled after surgery.    Thank you for choosing [place] at [facility] to provide your oncology and hematology care.  To afford each patient quality time with our provider, please arrive at least 15 minutes before your scheduled appointment time.   If you have a lab appointment with the [facility] please come in thru the Main Entrance and check in at the main information desk.  You need to re-schedule your appointment should you arrive 10 or more minutes late.  We strive to give you quality time with our providers, and arriving late affects you and other patients whose appointments are after yours.  Also, if you no show three or more times for appointments you may be dismissed from the clinic at the providers discretion.     Again, thank you for choosing [facility].  Our hope is that these requests will decrease the amount of time that you wait before being seen by our physicians.       _____________________________________________________________  Should you have questions after your visit to [facility], please contact our office at [phone] and follow the prompts.  Our office hours are 8:00 a.m. and 4:30 p.m. Monday - Friday.  Please note that voicemails left after 4:00 p.m. may not be returned until the following business day.  We are closed weekends and major holidays.  You do have access to a nurse 24-7, just call the main number to the clinic [phone] and do not press any options, hold on the line  and a nurse will answer the phone.    For prescription refill requests, have your pharmacy contact our office and allow 72 hours.    Due to Covid, you will need to wear a mask upon entering the hospital. If you do not have a mask, a mask will be given to you at the Main Entrance upon arrival. For doctor visits, patients may have 1 support person age 89 or older with them. For treatment visits, patients can not have anyone with them due to social distancing guidelines and our immunocompromised population.

## 2021-07-30 DIAGNOSIS — R899 Unspecified abnormal finding in specimens from other organs, systems and tissues: Secondary | ICD-10-CM | POA: Diagnosis not present

## 2021-07-30 DIAGNOSIS — E039 Hypothyroidism, unspecified: Secondary | ICD-10-CM | POA: Diagnosis not present

## 2021-07-30 DIAGNOSIS — R42 Dizziness and giddiness: Secondary | ICD-10-CM | POA: Diagnosis not present

## 2021-07-30 DIAGNOSIS — E042 Nontoxic multinodular goiter: Secondary | ICD-10-CM | POA: Diagnosis not present

## 2021-07-30 DIAGNOSIS — I10 Essential (primary) hypertension: Secondary | ICD-10-CM | POA: Diagnosis not present

## 2021-07-30 DIAGNOSIS — E890 Postprocedural hypothyroidism: Secondary | ICD-10-CM | POA: Diagnosis not present

## 2021-07-30 DIAGNOSIS — C73 Malignant neoplasm of thyroid gland: Secondary | ICD-10-CM | POA: Diagnosis not present

## 2021-07-31 DIAGNOSIS — E890 Postprocedural hypothyroidism: Secondary | ICD-10-CM | POA: Diagnosis not present

## 2021-07-31 DIAGNOSIS — C73 Malignant neoplasm of thyroid gland: Secondary | ICD-10-CM | POA: Diagnosis not present

## 2021-08-02 LAB — T4, FREE: Free T4: 2.12 ng/dL — ABNORMAL HIGH (ref 0.82–1.77)

## 2021-08-02 LAB — TSH: TSH: 9.44 u[IU]/mL — ABNORMAL HIGH (ref 0.450–4.500)

## 2021-08-02 LAB — CALCITONIN: Calcitonin: 95 pg/mL — ABNORMAL HIGH (ref 0.0–5.0)

## 2021-08-02 LAB — CEA: CEA: 4.6 ng/mL (ref 0.0–4.7)

## 2021-08-05 ENCOUNTER — Other Ambulatory Visit (HOSPITAL_COMMUNITY): Payer: Self-pay | Admitting: Surgery

## 2021-08-06 ENCOUNTER — Other Ambulatory Visit: Payer: Self-pay | Admitting: Surgery

## 2021-08-06 ENCOUNTER — Other Ambulatory Visit (HOSPITAL_COMMUNITY): Payer: Self-pay | Admitting: Surgery

## 2021-08-06 DIAGNOSIS — C73 Malignant neoplasm of thyroid gland: Secondary | ICD-10-CM

## 2021-08-06 DIAGNOSIS — E890 Postprocedural hypothyroidism: Secondary | ICD-10-CM

## 2021-08-07 ENCOUNTER — Encounter: Payer: Self-pay | Admitting: "Endocrinology

## 2021-08-07 ENCOUNTER — Other Ambulatory Visit: Payer: Self-pay

## 2021-08-07 ENCOUNTER — Ambulatory Visit: Payer: Medicare HMO | Admitting: "Endocrinology

## 2021-08-07 VITALS — BP 124/58 | HR 68 | Ht 66.0 in | Wt 152.8 lb

## 2021-08-07 DIAGNOSIS — E890 Postprocedural hypothyroidism: Secondary | ICD-10-CM | POA: Diagnosis not present

## 2021-08-07 DIAGNOSIS — C73 Malignant neoplasm of thyroid gland: Secondary | ICD-10-CM

## 2021-08-07 MED ORDER — LEVOTHYROXINE SODIUM 88 MCG PO TABS: 88.0000 ug | ORAL_TABLET | Freq: Every day | ORAL | 1 refills | Status: DC

## 2021-08-07 NOTE — Progress Notes (Signed)
08/07/2021, 5:16 PM  Endocrinology follow-up note   Subjective:    Patient ID: Theresa Buchanan, female    DOB: 28-Jul-1945, PCP Jake Samples, PA-C   Past Medical History:  Diagnosis Date   Family history of cancer    Hypertension    Thyroid disease    Past Surgical History:  Procedure Laterality Date   ABDOMINAL HYSTERECTOMY     COLON SURGERY     THYROIDECTOMY N/A 03/26/2021   Procedure: TOTAL THYROIDECTOMY WITH LIMITED LYMPH NODE DISSECTION;  Surgeon: Armandina Gemma, MD;  Location: [facility];  Service: General;  Laterality: N/A;   TUBAL LIGATION     Social History   Socioeconomic History   Marital status: Married    Spouse name: Not on file   Number of children: Not on file   Years of education: Not on file   Highest education level: Not on file  Occupational History   Not on file  Tobacco Use   Smoking status: Never   Smokeless tobacco: Never  Vaping Use   Vaping Use: Never used  Substance and Sexual Activity   Alcohol use: Never   Drug use: Never   Sexual activity: Not on file  Other Topics Concern   Not on file  Social History Narrative   Not on file   Social Determinants of Health   Financial Resource Strain: Not on file  Food Insecurity: Not on file  Transportation Needs: Not on file  Physical Activity: Not on file  Stress: Not on file  Social Connections: Not on file   Family History  Problem Relation Age of Onset   Hypertension Mother    Thyroid disease Mother    Diabetes Mother    Cancer Maternal Aunt        unk types   Cancer Maternal Uncle        unk types   Outpatient Encounter Medications as of 08/07/2021  Medication Sig   aspirin EC 81 MG tablet Take 81 mg by mouth 2 (two) times a week.   calcium carbonate (TUMS) 500 MG chewable tablet Chew 2 tablets (400 mg of elemental calcium total) by mouth 2 (two) times daily.   Camphor-Menthol-Methyl Sal (SALONPAS) 3.06-21-08 % PTCH Place 1  application onto the skin daily as needed (pain).   levothyroxine (SYNTHROID) 88 MCG tablet Take 1 tablet (88 mcg total) by mouth daily before breakfast.   lisinopril-hydrochlorothiazide (ZESTORETIC) 20-25 MG tablet Take 1 tablet by mouth daily.   Polyethyl Glycol-Propyl Glycol (SYSTANE OP) Place 1 drop into both eyes 2 (two) times daily as needed (dry eyes).   traMADol (ULTRAM) 50 MG tablet Take 50 mg by mouth every 6 (six) hours as needed.   trolamine salicylate (ASPERCREME) 10 % cream Apply 1 application topically as needed for muscle pain.   [DISCONTINUED] levothyroxine (SYNTHROID) 100 MCG tablet Take 100 mcg by mouth daily.   No facility-administered encounter medications on file as of 08/07/2021.   ALLERGIES: No Known Allergies  VACCINATION STATUS:  There is no immunization history on file for this patient.  HPI [name] is 76 y.o. female who presents today with a medical history as above. she follows in this clinic for RAI induced hypothyroidism.  However, she was found to have lateral multinodular goiter recently, in February 2022. Fine-needle aspiration in June 2022 showed thyroid malignancy. She was sent for total thyroidectomy which she underwent on March 26, 2021.  She is recovering from her surgery.  Her surgical sample showed medullary thyroid cancer measuring 3.1 cm, on the right lobe. Metastatic medullary thyroid carcinoma was found in 1 lymph node on the right paratracheal groove, and 1 out of 4 positive lymph node in the central compartment. She follows with her oncologist Dr. Delton Coombes.  She has gone through staging imaging, genetic testing.  Her genetic testing did not show any pathological mutations-hereditary cause was not determined.  Calcitonin continues to be elevated, most recent 95 on July 30, 2020 overall lowering from 136 in October 2022.  Her CEA level is normal at 4.6, dropping from 62.9 in October 2022. Her last CBC shows some lesions in the right  hepatic lobe, small lesions in around the right kidney, pulmonary lesions said to be hematoma, 10 mm rounded lesion along the anterior chest wall below the thyroid . She is scheduled to see he inferior right kidney, She has seen  Dr. Harlow Asa back recently, being considered for referral to Dr. Melissa Montane, ENT and neck surgery considerations.   She is currently on 100 mcg of levothyroxine.  Her previsit labs are consistent with slight over replacement.   She has lost about 6 pounds of weight since last visit.  Otherwise she feels better and her energy level is back to baseline.  She denies family history of thyroid malignancy, however reports various forms of thyroid dysfunction in her family members including her mother and sisters.    Presurgical chest x-ray showed 3.3 cm mass which was later said to be hamartoma.    CT scan however showed questionable renal lesion, unspecified liver lesions which are not seen on the MRI.  Review of Systems  Constitutional: + Steady weight,  no fatigue, no subjective hyperthermia, no subjective hypothermia Eyes: no blurry vision, no xerophthalmia ENT: no sore throat, no nodules palpated in throat, no dysphagia/odynophagia, no hoarseness   Objective:    Vitals with BMI 08/07/2021 07/16/2021 06/04/2021  Height 5\' 6"  5\' 5"  5\' 5"   Weight 152 lbs 13 oz 156 lbs 156 lbs  BMI 24.67 97.98 92.11  Systolic 941 740 814  Diastolic 58 72 92  Pulse 68 87 94    BP (!) 124/58    Pulse 68    Ht 5\' 6"  (1.676 m)    Wt 152 lb 12.8 oz (69.3 kg)    BMI 24.66 kg/m   Wt Readings from Last 3 Encounters:  08/07/21 152 lb 12.8 oz (69.3 kg)  07/16/21 156 lb (70.8 kg)  06/04/21 156 lb (70.8 kg)    Physical Exam  Constitutional:  Body mass index is 24.66 kg/m.,  not in acute distress, normal state of mind Eyes: PERRLA, EOMI, no exophthalmos ENT: moist mucous membranes, + post thyroidectomy scar, no gross cervical lymphadenopathy   Thyroid ultrasound August 06, 2020:  Right lobe 3.1 cm with 2.1 suspicious nodule. Left lobe 3.4 cm with 1.2 cm nonsuspicious nodule.  Fine-needle aspiration on December 12, 2020 of right-sided thyroid nodule Clinical History: 2.1 cm RML  Specimen Submitted:  A. THYROID, RIGHT, FINE NEEDLE ASPIRATION:   FINAL MICROSCOPIC DIAGNOSIS:  - Malignant cells present (Bethesda category VI)   SPECIMEN ADEQUACY:  Satisfactory for evaluation    Surgical sample from March 26, 2021 FINAL MICROSCOPIC DIAGNOSIS:   A.  THYROID, TOTAL THYROIDECTOMY:  - Medullary thyroid carcinoma, 3.1 cm.  - Tumor confined within the thyroid capsule.  - Margins not involved.  - One perithyroidal lymph node negative for metastatic carcinoma (0/1).  - See oncology table.   B. LYMPH NODE, RIGHT PARATRACHEAL, EXCISION:  - Metastatic medullary carcinoma in one lymph node (1/1).   C. LYMPH NODE, CENTRAL COMPARTMENT, EXCISION:  - Metastatic medullary carcinoma in one of four lymph nodes (1/4).   April 26, 2021 MRI abdomen with and without contrast:  IMPRESSION: 1. Lower pole right renal lesion is favored to represent a cystic renal cell carcinoma. Bosniak IV (2019). An adjacent area of more central precontrast T1 hyperintensity may represent a separate hemorrhagic cyst or less likely be part of the exophytic lesion. 2. Central upper pole right renal lesion is favored to represent a complex renal sinus cyst but is indeterminate. Presuming the patient does not undergo complete nephrectomy, recommend follow-up pre and post contrast abdominal MRI at 6 months. 3. No suspicious liver lesion. 4. No evidence of abdominal metastatic disease. 5. Right lower lobe hamartoma. 6.  Aortic Atherosclerosis (ICD10-I70.0).  Assessment & Plan:   1.  Medullary thyroid carcinoma 2.  Hypothyroidism following radioiodine therapy -I reviewed her interval imaging studies, genetic testing and lab work. - Her surgical cytology revealed locally invasive,  possibly distant metastatic medullary thyroid cancer-involving at least 2 lymph nodes in the neck.  She is status post total thyroidectomy with limited neck dissection.  She was subsequently worked up for staging imaging as well as genetic testing.  Genetic test is not showing hereditary cause.  Her imaging studies are abnormal along with persistently elevated calcitonin at 95 although it is improving.  She is being considered for further neck exploration/dissection by ENT surgeon Dr. Melissa Montane. She is encouraged to keep her appointment with Dr. Delton Coombes after her possible surgery.    There is no utility for I-131 thyroid remnant ablation for medullary thyroid cancer. Her previsit thyroid function tests are consistent with slight over replacement.  Her free T4 is showing iatrogenic thyrotoxicosis.  I discussed and lowered her levothyroxine to 88 mcg p.o. daily before breakfast.    - We discussed about the correct intake of her thyroid hormone, on empty stomach at fasting, with water, separated by at least 30 minutes from breakfast and other medications,  and separated by more than 4 hours from calcium, iron, multivitamins, acid reflux medications (PPIs). -Patient is made aware of the fact that thyroid hormone replacement is needed for life, dose to be adjusted by periodic monitoring of thyroid function tests.   - she is advised to maintain close follow up with Jake Samples, PA-C for primary care needs.    I spent 30 minutes in the care of the patient today including review of labs from Thyroid Function, CMP, and other relevant labs ; imaging/biopsy records (current and previous including abstractions from other facilities); face-to-face time discussing  her lab results and symptoms, medications doses, her options of short and long term treatment based on the latest standards of care / guidelines;   and documenting the encounter.  Jenny Reichmann Casher  participated in the discussions, expressed  understanding, and voiced agreement with the above plans.  All questions were answered to her satisfaction. she is encouraged to contact clinic should she have any questions or concerns prior to her return visit.    Follow up plan: Return in about 6 months (around 02/04/2022) for F/U with Pre-visit Labs.  Glade Lloyd, MD [facility] [address] [place], [place] [address] Phone: [phone]  Fax: [phone]     08/07/2021, 5:16 PM  This note was partially dictated with voice recognition software. Similar sounding words can be transcribed inadequately or may not  be corrected upon review.

## 2021-09-12 ENCOUNTER — Ambulatory Visit (HOSPITAL_COMMUNITY): Payer: Medicare HMO | Admitting: Hematology

## 2021-09-16 ENCOUNTER — Ambulatory Visit (HOSPITAL_COMMUNITY)
Admission: RE | Admit: 2021-09-16 | Discharge: 2021-09-16 | Disposition: A | Discharge status: Home / Self Care | Payer: Medicare HMO | Source: Ambulatory Visit | Attending: Surgery | Admitting: Surgery

## 2021-09-16 DIAGNOSIS — C73 Malignant neoplasm of thyroid gland: Secondary | ICD-10-CM | POA: Diagnosis not present

## 2021-09-16 DIAGNOSIS — J432 Centrilobular emphysema: Secondary | ICD-10-CM | POA: Diagnosis not present

## 2021-09-16 DIAGNOSIS — M47812 Spondylosis without myelopathy or radiculopathy, cervical region: Secondary | ICD-10-CM | POA: Diagnosis not present

## 2021-09-16 DIAGNOSIS — E890 Postprocedural hypothyroidism: Secondary | ICD-10-CM | POA: Insufficient documentation

## 2021-09-16 DIAGNOSIS — L989 Disorder of the skin and subcutaneous tissue, unspecified: Secondary | ICD-10-CM | POA: Diagnosis not present

## 2021-09-16 DIAGNOSIS — J9811 Atelectasis: Secondary | ICD-10-CM | POA: Diagnosis not present

## 2021-09-16 DIAGNOSIS — R911 Solitary pulmonary nodule: Secondary | ICD-10-CM | POA: Diagnosis not present

## 2021-09-16 DIAGNOSIS — C801 Malignant (primary) neoplasm, unspecified: Secondary | ICD-10-CM | POA: Diagnosis not present

## 2021-09-16 LAB — POCT I-STAT CREATININE: Creatinine, Ser: 1.3 mg/dL — ABNORMAL HIGH (ref 0.44–1.00)

## 2021-09-16 MED ORDER — IOHEXOL 300 MG/ML  SOLN
75.0000 mL | Freq: Once | INTRAMUSCULAR | Status: AC | PRN
Start: 2021-09-16 — End: 2021-09-16
  Administered 2021-09-16: 75 mL via INTRAVENOUS

## 2021-09-27 DIAGNOSIS — E663 Overweight: Secondary | ICD-10-CM | POA: Diagnosis not present

## 2021-09-27 DIAGNOSIS — Z008 Encounter for other general examination: Secondary | ICD-10-CM | POA: Diagnosis not present

## 2021-09-27 DIAGNOSIS — Z6827 Body mass index (BMI) 27.0-27.9, adult: Secondary | ICD-10-CM | POA: Diagnosis not present

## 2021-09-27 DIAGNOSIS — Z823 Family history of stroke: Secondary | ICD-10-CM | POA: Diagnosis not present

## 2021-09-27 DIAGNOSIS — E890 Postprocedural hypothyroidism: Secondary | ICD-10-CM | POA: Diagnosis not present

## 2021-09-27 DIAGNOSIS — I10 Essential (primary) hypertension: Secondary | ICD-10-CM | POA: Diagnosis not present

## 2021-09-27 DIAGNOSIS — Z85850 Personal history of malignant neoplasm of thyroid: Secondary | ICD-10-CM | POA: Diagnosis not present

## 2021-09-27 DIAGNOSIS — Z8249 Family history of ischemic heart disease and other diseases of the circulatory system: Secondary | ICD-10-CM | POA: Diagnosis not present

## 2021-09-27 DIAGNOSIS — Z833 Family history of diabetes mellitus: Secondary | ICD-10-CM | POA: Diagnosis not present

## 2021-09-27 DIAGNOSIS — M1990 Unspecified osteoarthritis, unspecified site: Secondary | ICD-10-CM | POA: Diagnosis not present

## 2021-10-29 DIAGNOSIS — C73 Malignant neoplasm of thyroid gland: Secondary | ICD-10-CM | POA: Diagnosis not present

## 2022-01-28 DIAGNOSIS — E890 Postprocedural hypothyroidism: Secondary | ICD-10-CM | POA: Diagnosis not present

## 2022-01-29 LAB — TSH: TSH: 26 u[IU]/mL — ABNORMAL HIGH (ref 0.450–4.500)

## 2022-01-29 LAB — T4, FREE: Free T4: 1.88 ng/dL — ABNORMAL HIGH (ref 0.82–1.77)

## 2022-01-30 ENCOUNTER — Other Ambulatory Visit: Payer: Self-pay | Admitting: "Endocrinology

## 2022-02-04 ENCOUNTER — Ambulatory Visit: Payer: Medicare HMO | Admitting: "Endocrinology

## 2022-02-04 ENCOUNTER — Encounter: Payer: Self-pay | Admitting: "Endocrinology

## 2022-02-04 VITALS — BP 122/74 | HR 80 | Ht 66.0 in | Wt 156.8 lb

## 2022-02-04 DIAGNOSIS — E890 Postprocedural hypothyroidism: Secondary | ICD-10-CM

## 2022-02-04 DIAGNOSIS — C73 Malignant neoplasm of thyroid gland: Secondary | ICD-10-CM | POA: Diagnosis not present

## 2022-02-04 MED ORDER — LEVOTHYROXINE SODIUM 75 MCG PO TABS: 75.0000 ug | ORAL_TABLET | Freq: Every day | ORAL | 1 refills | Status: DC

## 2022-02-04 NOTE — Progress Notes (Signed)
02/04/2022, 5:00 PM  Endocrinology follow-up note   Subjective:    Patient ID: Theresa Buchanan, female    DOB: 1945-08-14, PCP Jake Samples, PA-C   Past Medical History:  Diagnosis Date   Family history of cancer    Hypertension    Thyroid disease    Past Surgical History:  Procedure Laterality Date   ABDOMINAL HYSTERECTOMY     COLON SURGERY     THYROIDECTOMY N/A 03/26/2021   Procedure: TOTAL THYROIDECTOMY WITH LIMITED LYMPH NODE DISSECTION;  Surgeon: Armandina Gemma, MD;  Location: [facility];  Service: General;  Laterality: N/A;   TUBAL LIGATION     Social History   Socioeconomic History   Marital status: Married    Spouse name: Not on file   Number of children: Not on file   Years of education: Not on file   Highest education level: Not on file  Occupational History   Not on file  Tobacco Use   Smoking status: Never   Smokeless tobacco: Never  Vaping Use   Vaping Use: Never used  Substance and Sexual Activity   Alcohol use: Never   Drug use: Never   Sexual activity: Not on file  Other Topics Concern   Not on file  Social History Narrative   Not on file   Social Determinants of Health   Financial Resource Strain: Not on file  Food Insecurity: Not on file  Transportation Needs: Not on file  Physical Activity: Not on file  Stress: Not on file  Social Connections: Not on file   Family History  Problem Relation Age of Onset   Hypertension Mother    Thyroid disease Mother    Diabetes Mother    Cancer Maternal Aunt        unk types   Cancer Maternal Uncle        unk types   Outpatient Encounter Medications as of 02/04/2022  Medication Sig   aspirin EC 81 MG tablet Take 81 mg by mouth 2 (two) times a week.   calcium carbonate (TUMS) 500 MG chewable tablet Chew 2 tablets (400 mg of elemental calcium total) by mouth 2 (two) times daily.   Camphor-Menthol-Methyl Sal (SALONPAS) 3.06-21-08 % PTCH Place 1  application onto the skin daily as needed (pain).   levothyroxine (SYNTHROID) 75 MCG tablet Take 1 tablet (75 mcg total) by mouth daily before breakfast.   lisinopril-hydrochlorothiazide (ZESTORETIC) 20-25 MG tablet Take 1 tablet by mouth daily.   Polyethyl Glycol-Propyl Glycol (SYSTANE OP) Place 1 drop into both eyes 2 (two) times daily as needed (dry eyes).   traMADol (ULTRAM) 50 MG tablet Take 50 mg by mouth every 6 (six) hours as needed.   trolamine salicylate (ASPERCREME) 10 % cream Apply 1 application topically as needed for muscle pain.   [DISCONTINUED] levothyroxine (SYNTHROID) 88 MCG tablet Take 1 tablet by mouth once daily   No facility-administered encounter medications on file as of 02/04/2022.   ALLERGIES: No Known Allergies  VACCINATION STATUS:  There is no immunization history on file for this patient.  HPI Theresa Buchanan is 76 y.o. female who presents today with a medical history as above. she follows in this clinic for RAI induced  hypothyroidism.  His treatment was around 2006.  However, she was later found to have  multinodular goiter recently, in February 2022. Fine-needle aspiration in June 2022 showed thyroid malignancy. She was sent for total thyroidectomy which she underwent on March 26, 2021.  She is recovering from her surgery.  Her surgical sample showed medullary thyroid cancer measuring 3.1 cm, on the right lobe. Metastatic medullary thyroid carcinoma was found in 1 lymph node on the right paratracheal groove, and 1 out of 4 positive lymph node in the central compartment. She followed  with her oncologist Dr. Delton Coombes.  She has gone through staging imaging, genetic testing.  Her genetic testing did not show any pathological mutations-hereditary cause was not determined.  Calcitonin continues to be elevated, most recent 95 on July 30, 2020 overall lowering from 136 in October 2022.  Her CEA level is normal at 4.6, dropping from 62.9 in October 2022. Her CT  scan showed some lesions in the right hepatic lobe, small lesions in around the right kidney, pulmonary lesions said to be hematoma, 10 mm rounded lesion along the anterior chest wall below the thyroid .  She was evaluated by ENT surgeon with repeat CT scan.  She was not found to have identifiable surgically amenable lesion.  She has left thoracic wall lesion which was thought to be cyst.  She has not seen Dr. Delton Coombes since January 2023. He remains on levothyroxine 88 mcg p.o. daily.  Her previsit labs are discordant, however I did not over replacement. She feels well, has no acute complaints today.  Steady weight, steady appetite.  She denies family history of thyroid malignancy, however reports various forms of thyroid dysfunction in her family members including her mother and sisters.    Presurgical chest x-ray showed 3.3 cm mass which was later said to be hamartoma.    CT scan however showed questionable renal lesion, unspecified liver lesions which are not seen on the MRI.  Review of Systems  Constitutional: + Steady weight,  no fatigue, no subjective hyperthermia, no subjective hypothermia Eyes: no blurry vision, no xerophthalmia ENT: no sore throat, no nodules palpated in throat, no dysphagia/odynophagia, no hoarseness   Objective:       02/04/2022    1:47 PM 08/07/2021    1:55 PM 07/16/2021   12:01 PM  Vitals with BMI  Height '5\' 6"'$  '5\' 6"'$  '5\' 5"'$   Weight 156 lbs 13 oz 152 lbs 13 oz 156 lbs  BMI 25.32 93.26 71.24  Systolic 580 998 338  Diastolic 74 58 72  Pulse 80 68 87    BP 122/74   Pulse 80   Ht '5\' 6"'$  (1.676 m)   Wt 156 lb 12.8 oz (71.1 kg)   BMI 25.31 kg/m   Wt Readings from Last 3 Encounters:  02/04/22 156 lb 12.8 oz (71.1 kg)  08/07/21 152 lb 12.8 oz (69.3 kg)  07/16/21 156 lb (70.8 kg)    Physical Exam  Constitutional:  Body mass index is 25.31 kg/m.,  not in acute distress, normal state of mind Eyes: PERRLA, EOMI, no exophthalmos ENT: moist mucous  membranes, + post thyroidectomy scar, no gross cervical lymphadenopathy   Thyroid ultrasound August 06, 2020:  Right lobe 3.1 cm with 2.1 suspicious nodule. Left lobe 3.4 cm with 1.2 cm nonsuspicious nodule.  Fine-needle aspiration on December 12, 2020 of right-sided thyroid nodule Clinical History: 2.1 cm RML  Specimen Submitted:  A. THYROID, RIGHT, FINE NEEDLE ASPIRATION:   FINAL MICROSCOPIC DIAGNOSIS:  -  Malignant cells present (Bethesda category VI)   SPECIMEN ADEQUACY:  Satisfactory for evaluation    Surgical sample from March 26, 2021 FINAL MICROSCOPIC DIAGNOSIS:   A. THYROID, TOTAL THYROIDECTOMY:  - Medullary thyroid carcinoma, 3.1 cm.  - Tumor confined within the thyroid capsule.  - Margins not involved.  - One perithyroidal lymph node negative for metastatic carcinoma (0/1).  - See oncology table.   B. LYMPH NODE, RIGHT PARATRACHEAL, EXCISION:  - Metastatic medullary carcinoma in one lymph node (1/1).   C. LYMPH NODE, CENTRAL COMPARTMENT, EXCISION:  - Metastatic medullary carcinoma in one of four lymph nodes (1/4).   April 26, 2021 MRI abdomen with and without contrast:  IMPRESSION: 1. Lower pole right renal lesion is favored to represent a cystic renal cell carcinoma. Bosniak IV (2019). An adjacent area of more central precontrast T1 hyperintensity may represent a separate hemorrhagic cyst or less likely be part of the exophytic lesion. 2. Central upper pole right renal lesion is favored to represent a complex renal sinus cyst but is indeterminate. Presuming the patient does not undergo complete nephrectomy, recommend follow-up pre and post contrast abdominal MRI at 6 months. 3. No suspicious liver lesion. 4. No evidence of abdominal metastatic disease. 5. Right lower lobe hamartoma. 6.  Aortic Atherosclerosis (ICD10-I70.0).  Assessment & Plan:   1.  Medullary thyroid carcinoma 2.  Hypothyroidism following radioiodine therapy -I reviewed her  interval imaging studies, genetic testing and lab work. - Her surgical cytology revealed locally invasive, possibly distant metastatic medullary thyroid cancer-involving at least 2 lymph nodes in the neck.  She is status post total thyroidectomy with limited neck dissection.  She was subsequently worked up for staging imaging as well as genetic testing.  Genetic test is not showing hereditary cause.  Her imaging studies are abnormal along with persistently elevated calcitonin at 95 although it is improving.   Her evaluation by ENT surgeon Dr. Lucien Mons.  CT neck/thorax did not reveal an identifiable surgical lesion.  Left thoracic wall lesion was described as possible as epidermal inclusion  cyst.   She has not undergone any additional surgery.  She is encouraged to make appointment to see Dr. Delton Coombes.    There is no utility for I-131 thyroid remnant ablation for medullary thyroid cancer. Her previsit thyroid function tests are consistent with over replacement.  I discussed and lowered her levothyroxine to 75 mcg p.o. daily before breakfast.     - We discussed about the correct intake of her thyroid hormone, on empty stomach at fasting, with water, separated by at least 30 minutes from breakfast and other medications,  and separated by more than 4 hours from calcium, iron, multivitamins, acid reflux medications (PPIs). -Patient is made aware of the fact that thyroid hormone replacement is needed for life, dose to be adjusted by periodic monitoring of thyroid function tests. She will have labs including calcitonin in the next few days. If Calcitonin is rising , she may need repeat neck/chest imaging.   - she is advised to maintain close follow up with Jake Samples, PA-C for primary care needs.    I spent  30 minutes in the care of the patient today including review of labs from Thyroid Function, CMP, and other relevant labs ; imaging/biopsy records (current and previous including  abstractions from other facilities); face-to-face time discussing  her lab results and symptoms, medications doses, her options of short and long term treatment based on the latest standards of care / guidelines;  and documenting the encounter.  Theresa Buchanan  participated in the discussions, expressed understanding, and voiced agreement with the above plans.  All questions were answered to her satisfaction. she is encouraged to contact clinic should she have any questions or concerns prior to her return visit.     Follow up plan: Return in about 3 months (around 05/07/2022) for F/U with Pre-visit Labs.   Glade Lloyd, MD [facility] [address] [place], [place] [address] Phone: [phone]  Fax: [phone]     02/04/2022, 5:00 PM  This note was partially dictated with voice recognition software. Similar sounding words can be transcribed inadequately or may not  be corrected upon review.

## 2022-02-05 ENCOUNTER — Telehealth: Payer: Self-pay | Admitting: "Endocrinology

## 2022-02-05 NOTE — Telephone Encounter (Signed)
If patient calls back, Dr Dorris Fetch wants her to do her labs next week, as her ENT and Dr. Tera Helper will need the calcitonin results.  Thank you.   The orders are in the system

## 2022-03-05 ENCOUNTER — Other Ambulatory Visit (HOSPITAL_COMMUNITY): Payer: Self-pay | Admitting: Family Medicine

## 2022-03-05 DIAGNOSIS — Z1231 Encounter for screening mammogram for malignant neoplasm of breast: Secondary | ICD-10-CM

## 2022-03-10 ENCOUNTER — Other Ambulatory Visit (HOSPITAL_COMMUNITY): Payer: Self-pay | Admitting: Family Medicine

## 2022-03-10 DIAGNOSIS — N6320 Unspecified lump in the left breast, unspecified quadrant: Secondary | ICD-10-CM

## 2022-03-25 ENCOUNTER — Ambulatory Visit (HOSPITAL_COMMUNITY)
Admission: RE | Admit: 2022-03-25 | Discharge: 2022-03-25 | Disposition: A | Discharge status: Home / Self Care | Payer: Medicare HMO | Source: Ambulatory Visit | Attending: Family Medicine | Admitting: Family Medicine

## 2022-03-25 DIAGNOSIS — N6081 Other benign mammary dysplasias of right breast: Secondary | ICD-10-CM | POA: Insufficient documentation

## 2022-03-25 DIAGNOSIS — D242 Benign neoplasm of left breast: Secondary | ICD-10-CM | POA: Diagnosis not present

## 2022-03-25 DIAGNOSIS — N6323 Unspecified lump in the left breast, lower outer quadrant: Secondary | ICD-10-CM | POA: Diagnosis not present

## 2022-03-25 DIAGNOSIS — N6320 Unspecified lump in the left breast, unspecified quadrant: Secondary | ICD-10-CM | POA: Diagnosis not present

## 2022-03-25 DIAGNOSIS — N6001 Solitary cyst of right breast: Secondary | ICD-10-CM | POA: Diagnosis not present

## 2022-03-25 DIAGNOSIS — R922 Inconclusive mammogram: Secondary | ICD-10-CM | POA: Diagnosis not present

## 2022-03-28 DIAGNOSIS — M47812 Spondylosis without myelopathy or radiculopathy, cervical region: Secondary | ICD-10-CM | POA: Diagnosis not present

## 2022-03-28 DIAGNOSIS — Z6827 Body mass index (BMI) 27.0-27.9, adult: Secondary | ICD-10-CM | POA: Diagnosis not present

## 2022-03-28 DIAGNOSIS — Z1331 Encounter for screening for depression: Secondary | ICD-10-CM | POA: Diagnosis not present

## 2022-03-28 DIAGNOSIS — Z0001 Encounter for general adult medical examination with abnormal findings: Secondary | ICD-10-CM | POA: Diagnosis not present

## 2022-03-28 DIAGNOSIS — Z23 Encounter for immunization: Secondary | ICD-10-CM | POA: Diagnosis not present

## 2022-03-28 DIAGNOSIS — C73 Malignant neoplasm of thyroid gland: Secondary | ICD-10-CM | POA: Diagnosis not present

## 2022-03-28 DIAGNOSIS — R0989 Other specified symptoms and signs involving the circulatory and respiratory systems: Secondary | ICD-10-CM | POA: Diagnosis not present

## 2022-03-28 DIAGNOSIS — E663 Overweight: Secondary | ICD-10-CM | POA: Diagnosis not present

## 2022-03-28 DIAGNOSIS — I10 Essential (primary) hypertension: Secondary | ICD-10-CM | POA: Diagnosis not present

## 2022-04-02 DIAGNOSIS — C73 Malignant neoplasm of thyroid gland: Secondary | ICD-10-CM | POA: Diagnosis not present

## 2022-04-02 DIAGNOSIS — I10 Essential (primary) hypertension: Secondary | ICD-10-CM | POA: Diagnosis not present

## 2022-04-02 DIAGNOSIS — Z0001 Encounter for general adult medical examination with abnormal findings: Secondary | ICD-10-CM | POA: Diagnosis not present

## 2022-05-02 DIAGNOSIS — E890 Postprocedural hypothyroidism: Secondary | ICD-10-CM | POA: Diagnosis not present

## 2022-05-05 LAB — COMPREHENSIVE METABOLIC PANEL
ALT: 9 IU/L (ref 0–32)
AST: 15 IU/L (ref 0–40)
Albumin/Globulin Ratio: 1.4 (ref 1.2–2.2)
Albumin: 4.4 g/dL (ref 3.8–4.8)
Alkaline Phosphatase: 62 IU/L (ref 44–121)
BUN/Creatinine Ratio: 15 (ref 12–28)
BUN: 17 mg/dL (ref 8–27)
Bilirubin Total: 0.5 mg/dL (ref 0.0–1.2)
CO2: 27 mmol/L (ref 20–29)
Calcium: 9.9 mg/dL (ref 8.7–10.3)
Chloride: 98 mmol/L (ref 96–106)
Creatinine, Ser: 1.16 mg/dL — ABNORMAL HIGH (ref 0.57–1.00)
Globulin, Total: 3.1 g/dL (ref 1.5–4.5)
Glucose: 97 mg/dL (ref 70–99)
Potassium: 3.6 mmol/L (ref 3.5–5.2)
Sodium: 141 mmol/L (ref 134–144)
Total Protein: 7.5 g/dL (ref 6.0–8.5)
eGFR: 49 mL/min/{1.73_m2} — ABNORMAL LOW (ref >59)

## 2022-05-05 LAB — CALCITONIN: Calcitonin: 106 pg/mL — ABNORMAL HIGH (ref 0.0–5.0)

## 2022-05-05 LAB — T4, FREE: Free T4: 2.39 ng/dL — ABNORMAL HIGH (ref 0.82–1.77)

## 2022-05-05 LAB — TSH: TSH: 10.4 u[IU]/mL — ABNORMAL HIGH (ref 0.450–4.500)

## 2022-05-07 ENCOUNTER — Ambulatory Visit: Payer: Medicare HMO | Admitting: "Endocrinology

## 2022-05-27 ENCOUNTER — Encounter: Payer: Self-pay | Admitting: "Endocrinology

## 2022-05-27 ENCOUNTER — Ambulatory Visit: Payer: Medicare HMO | Admitting: "Endocrinology

## 2022-05-27 VITALS — BP 114/66 | HR 68 | Ht 66.0 in | Wt 158.4 lb

## 2022-05-27 DIAGNOSIS — E890 Postprocedural hypothyroidism: Secondary | ICD-10-CM

## 2022-05-27 DIAGNOSIS — C73 Malignant neoplasm of thyroid gland: Secondary | ICD-10-CM

## 2022-05-27 MED ORDER — LEVOTHYROXINE SODIUM 88 MCG PO TABS: 88.0000 ug | ORAL_TABLET | Freq: Every day | ORAL | 1 refills | Status: DC

## 2022-05-27 NOTE — Progress Notes (Signed)
05/27/2022, 4:13 PM  Endocrinology follow-up note   Subjective:    Patient ID: Theresa Buchanan, female    DOB: 1945-10-01, PCP Theresa Samples, PA-C   Past Medical History:  Diagnosis Date   Family history of cancer    Hypertension    Thyroid disease    Past Surgical History:  Procedure Laterality Date   ABDOMINAL HYSTERECTOMY     COLON SURGERY     THYROIDECTOMY N/A 03/26/2021   Procedure: TOTAL THYROIDECTOMY WITH LIMITED LYMPH NODE DISSECTION;  Surgeon: Armandina Gemma, MD;  Location: [facility];  Service: General;  Laterality: N/A;   TUBAL LIGATION     Social History   Socioeconomic History   Marital status: Married    Spouse name: Not on file   Number of children: Not on file   Years of education: Not on file   Highest education level: Not on file  Occupational History   Not on file  Tobacco Use   Smoking status: Never   Smokeless tobacco: Never  Vaping Use   Vaping Use: Never used  Substance and Sexual Activity   Alcohol use: Never   Drug use: Never   Sexual activity: Not on file  Other Topics Concern   Not on file  Social History Narrative   Not on file   Social Determinants of Health   Financial Resource Strain: Not on file  Food Insecurity: Not on file  Transportation Needs: Not on file  Physical Activity: Not on file  Stress: Not on file  Social Connections: Not on file   Family History  Problem Relation Age of Onset   Hypertension Mother    Thyroid disease Mother    Diabetes Mother    Cancer Maternal Aunt        unk types   Cancer Maternal Uncle        unk types   Outpatient Encounter Medications as of 05/27/2022  Medication Sig   aspirin EC 81 MG tablet Take 81 mg by mouth 2 (two) times a week.   calcium carbonate (TUMS) 500 MG chewable tablet Chew 2 tablets (400 mg of elemental calcium total) by mouth 2 (two) times daily.   Camphor-Menthol-Methyl Sal (SALONPAS) 3.06-21-08 % PTCH Place 1  application onto the skin daily as needed (pain).   levothyroxine (SYNTHROID) 88 MCG tablet Take 1 tablet (88 mcg total) by mouth daily before breakfast.   lisinopril-hydrochlorothiazide (ZESTORETIC) 20-25 MG tablet Take 1 tablet by mouth daily.   Polyethyl Glycol-Propyl Glycol (SYSTANE OP) Place 1 drop into both eyes 2 (two) times daily as needed (dry eyes).   traMADol (ULTRAM) 50 MG tablet Take 50 mg by mouth every 6 (six) hours as needed.   trolamine salicylate (ASPERCREME) 10 % cream Apply 1 application topically as needed for muscle pain.   [DISCONTINUED] levothyroxine (SYNTHROID) 100 MCG tablet Take 100 mcg by mouth every morning.   [DISCONTINUED] levothyroxine (SYNTHROID) 75 MCG tablet Take 1 tablet (75 mcg total) by mouth daily before breakfast.   No facility-administered encounter medications on file as of 05/27/2022.   ALLERGIES: No Known Allergies  VACCINATION STATUS:  There is no immunization history on file for this patient.  HPI Theresa Buchanan is 76  y.o. female who presents today with a medical history as above. she follows in this clinic for RAI induced hypothyroidism.  His treatment was around 2006.  However, she was later found to have  multinodular goiter recently, in February 2022. Fine-needle aspiration in June 2022 showed thyroid malignancy. She was sent for total thyroidectomy which she underwent on March 26, 2021.  She has recovered from her surgery.  Her surgical sample showed medullary thyroid cancer measuring 3.1 cm, on the right lobe. Metastatic medullary thyroid carcinoma was found in 1 lymph node on the right paratracheal groove, and 1 out of 4 positive lymph node in the central compartment. She followed  with her oncologist Dr. Delton Coombes.  She has gone through staging imaging, genetic testing.  Her genetic testing did not show any pathological mutations-hereditary cause was not determined.  Calcitonin continues to be elevated,  as high as  136 in October 2022.   Her CEA level is normal at 4.6, dropping from 62.9 in October 2022. Her CT scan showed some lesions in the right hepatic lobe, small lesions in around the right kidney, pulmonary lesions said to be hematoma, 10 mm rounded lesion along the anterior chest wall below the thyroid .  She was evaluated by ENT surgeon with repeat CT scan.  She was not found to have identifiable surgically amenable lesion.  She has left thoracic wall lesion which was thought to be cyst.  Despite my recommendations during her last visit, she has not seen Dr. Delton Coombes since January 2023. Her levothyroxine was increased to 100 mcg p.o. daily by her PCP in the interval. She presents with anxiety , heat intolerance.  She denies family history of thyroid malignancy, however reports various forms of thyroid dysfunction in her family members including her mother and sisters.    Presurgical chest x-ray showed 3.3 cm mass which was later said to be hamartoma.    CT scan however showed questionable renal lesion, unspecified liver lesions which are not seen on the MRI.  Review of Systems  Constitutional: + Steady weight,  no fatigue, no subjective hyperthermia, no subjective hypothermia Eyes: no blurry vision, no xerophthalmia ENT: no sore throat, no nodules palpated in throat, no dysphagia/odynophagia, no hoarseness   Objective:       05/27/2022    2:54 PM 02/04/2022    1:47 PM 08/07/2021    1:55 PM  Vitals with BMI  Height '5\' 6"'$  '5\' 6"'$  '5\' 6"'$   Weight 158 lbs 6 oz 156 lbs 13 oz 152 lbs 13 oz  BMI 25.58 22.63 33.54  Systolic 562 563 893  Diastolic 66 74 58  Pulse 68 80 68    BP 114/66   Pulse 68   Ht '5\' 6"'$  (1.676 m)   Wt 158 lb 6.4 oz (71.8 kg)   BMI 25.57 kg/m   Wt Readings from Last 3 Encounters:  05/27/22 158 lb 6.4 oz (71.8 kg)  02/04/22 156 lb 12.8 oz (71.1 kg)  08/07/21 152 lb 12.8 oz (69.3 kg)    Physical Exam  Constitutional:  Body mass index is 25.57 kg/m.,  not in acute distress, normal state  of mind Eyes: PERRLA, EOMI, no exophthalmos ENT: moist mucous membranes, + post thyroidectomy scar, no gross cervical lymphadenopathy   Thyroid ultrasound August 06, 2020:  Right lobe 3.1 cm with 2.1 suspicious nodule. Left lobe 3.4 cm with 1.2 cm nonsuspicious nodule.  Fine-needle aspiration on December 12, 2020 of right-sided thyroid nodule Clinical History: 2.1 cm RML  Specimen  Submitted:  A. THYROID, RIGHT, FINE NEEDLE ASPIRATION:   FINAL MICROSCOPIC DIAGNOSIS:  - Malignant cells present (Bethesda category VI)   SPECIMEN ADEQUACY:  Satisfactory for evaluation    Surgical sample from March 26, 2021 FINAL MICROSCOPIC DIAGNOSIS:   A. THYROID, TOTAL THYROIDECTOMY:  - Medullary thyroid carcinoma, 3.1 cm.  - Tumor confined within the thyroid capsule.  - Margins not involved.  - One perithyroidal lymph node negative for metastatic carcinoma (0/1).  - See oncology table.   B. LYMPH NODE, RIGHT PARATRACHEAL, EXCISION:  - Metastatic medullary carcinoma in one lymph node (1/1).   C. LYMPH NODE, CENTRAL COMPARTMENT, EXCISION:  - Metastatic medullary carcinoma in one of four lymph nodes (1/4).   April 26, 2021 MRI abdomen with and without contrast:  IMPRESSION: 1. Lower pole right renal lesion is favored to represent a cystic renal cell carcinoma. Bosniak IV (2019). An adjacent area of more central precontrast T1 hyperintensity may represent a separate hemorrhagic cyst or less likely be part of the exophytic lesion. 2. Central upper pole right renal lesion is favored to represent a complex renal sinus cyst but is indeterminate. Presuming the patient does not undergo complete nephrectomy, recommend follow-up pre and post contrast abdominal MRI at 6 months. 3. No suspicious liver lesion. 4. No evidence of abdominal metastatic disease. 5. Right lower lobe hamartoma. 6.  Aortic Atherosclerosis (ICD10-I70.0).    Assessment & Plan:   1.  Medullary thyroid carcinoma 2.   Hypothyroidism following radioiodine therapy -I reviewed her interval imaging studies, genetic testing and lab work. - Her surgical cytology revealed locally invasive, possibly distant metastatic medullary thyroid cancer-involving at least 2 lymph nodes in the neck.  She is status post total thyroidectomy with limited neck dissection.  She was subsequently worked up for staging imaging as well as genetic testing.  Genetic test is not showing hereditary cause.  Her imaging studies are abnormal along with persistently elevated calcitonin currently at 106.   Her evaluation by ENT surgeon Dr. Lucien Mons.  CT neck/thorax did not reveal an identifiable surgical lesion.  Left thoracic wall lesion was described as possible epidermal inclusion  cyst.   She has not undergone any additional surgery.  She is encouraged to make appointment to see Dr. Delton Coombes.    There is no utility for I-131 thyroid remnant ablation for medullary thyroid cancer. Her previsit thyroid function tests are consistent with over replacement.  I discussed and lowered her levothyroxine to 88 mcg p.o. daily before breakfast.     - We discussed about the correct intake of her thyroid hormone, on empty stomach at fasting, with water, separated by at least 30 minutes from breakfast and other medications,  and separated by more than 4 hours from calcium, iron, multivitamins, acid reflux medications (PPIs). -Patient is made aware of the fact that thyroid hormone replacement is needed for life, dose to be adjusted by periodic monitoring of thyroid function tests.  She will have labs including calcitonin, CEA before next visit.   - she is advised to maintain close follow up with Theresa Samples, PA-C for primary care needs.    I spent 21 minutes in the care of the patient today including review of labs from Thyroid Function, CMP, and other relevant labs ; imaging/biopsy records (current and previous including abstractions from other  facilities); face-to-face time discussing  her lab results and symptoms, medications doses, her options of short and long term treatment based on the latest standards of care / guidelines;  and documenting the encounter.  Jenny Reichmann Politte  participated in the discussions, expressed understanding, and voiced agreement with the above plans.  All questions were answered to her satisfaction. she is encouraged to contact clinic should she have any questions or concerns prior to her return visit.   Follow up plan: Return in about 3 months (around 08/26/2022) for F/U with Pre-visit Labs.   Glade Lloyd, MD [facility] [address] [place], [place] [address] Phone: [phone]  Fax: [phone]     05/27/2022, 4:13 PM  This note was partially dictated with voice recognition software. Similar sounding words can be transcribed inadequately or may not  be corrected upon review.

## 2022-05-27 NOTE — Patient Instructions (Addendum)
  Call and make appointment with Dr. [facility] [place]. [address] [place], [place] [address] [phone]

## 2022-06-25 ENCOUNTER — Other Ambulatory Visit: Payer: Self-pay

## 2022-06-25 DIAGNOSIS — C73 Malignant neoplasm of thyroid gland: Secondary | ICD-10-CM

## 2022-06-25 NOTE — Progress Notes (Signed)
Lab orders placed per verbal order from Dr. Delton Coombes

## 2022-06-26 ENCOUNTER — Inpatient Hospital Stay: Payer: Medicare HMO

## 2022-06-27 ENCOUNTER — Inpatient Hospital Stay: Payer: Medicare HMO | Attending: Hematology

## 2022-06-27 DIAGNOSIS — R970 Elevated carcinoembryonic antigen [CEA]: Secondary | ICD-10-CM | POA: Insufficient documentation

## 2022-06-27 DIAGNOSIS — N2889 Other specified disorders of kidney and ureter: Secondary | ICD-10-CM | POA: Insufficient documentation

## 2022-06-27 DIAGNOSIS — Z809 Family history of malignant neoplasm, unspecified: Secondary | ICD-10-CM | POA: Insufficient documentation

## 2022-06-27 DIAGNOSIS — Z90710 Acquired absence of both cervix and uterus: Secondary | ICD-10-CM | POA: Diagnosis not present

## 2022-06-27 DIAGNOSIS — F40240 Claustrophobia: Secondary | ICD-10-CM | POA: Diagnosis not present

## 2022-06-27 DIAGNOSIS — F419 Anxiety disorder, unspecified: Secondary | ICD-10-CM | POA: Diagnosis not present

## 2022-06-27 DIAGNOSIS — C73 Malignant neoplasm of thyroid gland: Secondary | ICD-10-CM | POA: Insufficient documentation

## 2022-06-27 DIAGNOSIS — I10 Essential (primary) hypertension: Secondary | ICD-10-CM | POA: Insufficient documentation

## 2022-06-27 DIAGNOSIS — R69 Illness, unspecified: Secondary | ICD-10-CM | POA: Diagnosis not present

## 2022-06-27 LAB — COMPREHENSIVE METABOLIC PANEL
ALT: 15 U/L (ref 0–44)
AST: 19 U/L (ref 15–41)
Albumin: 4 g/dL (ref 3.5–5.0)
Alkaline Phosphatase: 61 U/L (ref 38–126)
Anion gap: 11 (ref 5–15)
BUN: 24 mg/dL — ABNORMAL HIGH (ref 8–23)
CO2: 26 mmol/L (ref 22–32)
Calcium: 9.5 mg/dL (ref 8.9–10.3)
Chloride: 101 mmol/L (ref 98–111)
Creatinine, Ser: 1.05 mg/dL — ABNORMAL HIGH (ref 0.44–1.00)
GFR, Estimated: 56 mL/min — ABNORMAL LOW (ref >60)
Glucose, Bld: 94 mg/dL (ref 70–99)
Potassium: 3.3 mmol/L — ABNORMAL LOW (ref 3.5–5.1)
Sodium: 138 mmol/L (ref 135–145)
Total Bilirubin: 0.6 mg/dL (ref 0.3–1.2)
Total Protein: 7.8 g/dL (ref 6.5–8.1)

## 2022-06-27 LAB — CBC WITH DIFFERENTIAL/PLATELET
Abs Immature Granulocytes: 0.02 10*3/uL (ref 0.00–0.07)
Basophils Absolute: 0.1 10*3/uL (ref 0.0–0.1)
Basophils Relative: 1 %
Eosinophils Absolute: 0.2 10*3/uL (ref 0.0–0.5)
Eosinophils Relative: 3 %
HCT: 37.1 % (ref 36.0–46.0)
Hemoglobin: 12.3 g/dL (ref 12.0–15.0)
Immature Granulocytes: 0 %
Lymphocytes Relative: 26 %
Lymphs Abs: 1.8 10*3/uL (ref 0.7–4.0)
MCH: 26.7 pg (ref 26.0–34.0)
MCHC: 33.2 g/dL (ref 30.0–36.0)
MCV: 80.7 fL (ref 80.0–100.0)
Monocytes Absolute: 0.8 10*3/uL (ref 0.1–1.0)
Monocytes Relative: 12 %
Neutro Abs: 4 10*3/uL (ref 1.7–7.7)
Neutrophils Relative %: 58 %
Platelets: 309 10*3/uL (ref 150–400)
RBC: 4.6 MIL/uL (ref 3.87–5.11)
RDW: 15 % (ref 11.5–15.5)
WBC: 6.8 10*3/uL (ref 4.0–10.5)
nRBC: 0 % (ref 0.0–0.2)

## 2022-06-27 LAB — TSH: TSH: 16.043 u[IU]/mL — ABNORMAL HIGH (ref 0.350–4.500)

## 2022-07-01 LAB — CALCITONIN: Calcitonin: 109 pg/mL — ABNORMAL HIGH (ref 0.0–5.0)

## 2022-07-01 LAB — CEA: CEA: 4 ng/mL (ref 0.0–4.7)

## 2022-07-03 ENCOUNTER — Inpatient Hospital Stay (HOSPITAL_BASED_OUTPATIENT_CLINIC_OR_DEPARTMENT_OTHER): Payer: Medicare HMO | Admitting: Hematology

## 2022-07-03 ENCOUNTER — Encounter: Payer: Self-pay | Admitting: Hematology

## 2022-07-03 VITALS — BP 147/65 | HR 60 | Temp 98.0°F | Resp 18 | Wt 159.6 lb

## 2022-07-03 DIAGNOSIS — C73 Malignant neoplasm of thyroid gland: Secondary | ICD-10-CM

## 2022-07-03 DIAGNOSIS — Z90710 Acquired absence of both cervix and uterus: Secondary | ICD-10-CM | POA: Diagnosis not present

## 2022-07-03 DIAGNOSIS — I10 Essential (primary) hypertension: Secondary | ICD-10-CM | POA: Diagnosis not present

## 2022-07-03 DIAGNOSIS — R69 Illness, unspecified: Secondary | ICD-10-CM | POA: Diagnosis not present

## 2022-07-03 DIAGNOSIS — N2889 Other specified disorders of kidney and ureter: Secondary | ICD-10-CM | POA: Diagnosis not present

## 2022-07-03 DIAGNOSIS — R970 Elevated carcinoembryonic antigen [CEA]: Secondary | ICD-10-CM | POA: Diagnosis not present

## 2022-07-03 DIAGNOSIS — Z809 Family history of malignant neoplasm, unspecified: Secondary | ICD-10-CM | POA: Diagnosis not present

## 2022-07-03 NOTE — Patient Instructions (Addendum)
[facility]  Discharge Instructions  You were seen and examined today by Dr. Delton Coombes.  Your tumor markers remain high, Dr. Delton Coombes has ordered additional scans.  Follow-up as scheduled.  Thank you for choosing [facility] to provide your oncology and hematology care.   To afford each patient quality time with our provider, please arrive at least 15 minutes before your scheduled appointment time. You may need to reschedule your appointment if you arrive late (10 or more minutes). Arriving late affects you and other patients whose appointments are after yours.  Also, if you miss three or more appointments without notifying the office, you may be dismissed from the clinic at the provider's discretion.    Again, thank you for choosing [facility].  Our hope is that these requests will decrease the amount of time that you wait before being seen by our physicians.   If you have a lab appointment with the [facility] please come in thru the Main Entrance and check in at the main information desk.           _____________________________________________________________  Should you have questions after your visit to [facility], please contact our office at [phone] and follow the prompts.  Our office hours are 8:00 a.m. to 4:30 p.m. Monday - Thursday and 8:00 a.m. to 2:30 p.m. Friday.  Please note that voicemails left after 4:00 p.m. may not be returned until the following business day.  We are closed weekends and all major holidays.  You do have access to a nurse 24-7, just call the main number to the clinic [phone] and do not press any options, hold on the line and a nurse will answer the phone.    For prescription refill requests, have your pharmacy contact our office and allow 72 hours.    Masks are optional in the cancer centers. If you would like for your care team to wear a mask while they are  taking care of you, please let them know. You may have one support person who is at least 75 years old accompany you for your appointments.

## 2022-07-03 NOTE — Progress Notes (Signed)
[place], [place] [address]   CLINIC:  Medical Oncology/Hematology  PCP:  Theresa Samples, PA-C [address] / [place] [address] [phone]   REASON FOR VISIT:  Follow-up for medullary thyroid carcinoma  PRIOR THERAPY: Total thyroidectomy with limited central compartment lymph node dissection by Theresa Buchanan on 03/26/2021  NGS Results: not done  CURRENT THERAPY: surveillance  BRIEF ONCOLOGIC HISTORY:  Oncology History  Medullary thyroid carcinoma ([place])  03/17/2021 Initial Diagnosis   Medullary thyroid carcinoma (HCC)    Genetic Testing   Negative genetic testing. No pathogenic variants identified on the [facility] Multi-Cancer+RNA panel. VUS in SUFU called c.865C>T identified. The report date is 05/17/2021.  The Multi-Cancer Panel + RNA offered by [facility] includes sequencing and/or deletion duplication testing of the following 84 genes: AIP, ALK, APC, ATM, AXIN2,BAP1,  BARD1, BLM, BMPR1A, BRCA1, BRCA2, BRIP1, CASR, CDC73, CDH1, CDK4, CDKN1B, CDKN1C, CDKN2A (p14ARF), CDKN2A (p16INK4a), CEBPA, CHEK2, CTNNA1, DICER1, DIS3L2, EGFR (c.2369C>T, p.Thr790Met variant only), EPCAM (Deletion/duplication testing only), FH, FLCN, GATA2, GPC3, GREM1 (Promoter region deletion/duplication testing only), HOXB13 (c.251G>A, p.Gly84Glu), HRAS, KIT, MAX, MEN1, MET, MITF (c.952G>A, p.Glu318Lys variant only), MLH1, MSH2, MSH3, MSH6, MUTYH, NBN, NF1, NF2, NTHL1, PALB2, PDGFRA, PHOX2B, PMS2, POLD1, POLE, POT1, PRKAR1A, PTCH1, PTEN, RAD50, RAD51C, RAD51D, RB1, RECQL4, RET, RUNX1, SDHAF2, SDHA (sequence changes only), SDHB, SDHC, SDHD, SMAD4, SMARCA4, SMARCB1, SMARCE1, STK11, SUFU, TERC, TERT, TMEM127, TP53, TSC1, TSC2, VHL, WRN and WT1.     CANCER STAGING:  Cancer Staging  Medullary thyroid carcinoma [facility]) Staging form: Thyroid - Medullary, AJCC 8th Edition - Clinical stage from 04/15/2021: Stage IVC (cT2, cN1a, cM1) - Unsigned   INTERVAL HISTORY:   Theresa Buchanan, a 77 y.o. female, seen for follow-up of medullary thyroid carcinoma.  She was last seen by me in January 2023 and was lost to follow-up.  She denies any new onset pains.  Denies any dysphagia.  She was recently seen by Dr. Dorris Buchanan and was found to have elevated calcitonin levels and was sent back to [place].  REVIEW OF SYSTEMS:  Review of Systems  Constitutional:  Positive for fatigue.  Gastrointestinal:  Positive for constipation.  All other systems reviewed and are negative.   PAST MEDICAL/SURGICAL HISTORY:  Past Medical History:  Diagnosis Date   Family history of cancer    Hypertension    Thyroid disease    Past Surgical History:  Procedure Laterality Date   ABDOMINAL HYSTERECTOMY     COLON SURGERY     THYROIDECTOMY N/A 03/26/2021   Procedure: TOTAL THYROIDECTOMY WITH LIMITED LYMPH NODE DISSECTION;  Surgeon: Theresa Gemma, MD;  Location: [facility];  Service: General;  Laterality: N/A;   TUBAL LIGATION      SOCIAL HISTORY:  Social History   Socioeconomic History   Marital status: Married    Spouse name: Not on file   Number of children: Not on file   Years of education: Not on file   Highest education level: Not on file  Occupational History   Not on file  Tobacco Use   Smoking status: Never   Smokeless tobacco: Never  Vaping Use   Vaping Use: Never used  Substance and Sexual Activity   Alcohol use: Never   Drug use: Never   Sexual activity: Not on file  Other Topics Concern   Not on file  Social History Narrative   Not on file   Social Determinants of Health   Financial Resource Strain: Not on file  Food Insecurity: Not on file  Transportation Needs: Not on file  Physical Activity: Not on file  Stress: Not on file  Social Connections: Not on file  Intimate Partner Violence: Not on file    FAMILY HISTORY:  Family History  Problem Relation Age of Onset   Hypertension Mother    Thyroid disease Mother    Diabetes Mother    Cancer Maternal  Aunt        unk types   Cancer Maternal Uncle        unk types    CURRENT MEDICATIONS:  Current Outpatient Medications  Medication Sig Dispense Refill   aspirin EC 81 MG tablet Take 81 mg by mouth 2 (two) times a week.     calcium carbonate (TUMS) 500 MG chewable tablet Chew 2 tablets (400 mg of elemental calcium total) by mouth 2 (two) times daily. 90 tablet 1   Camphor-Menthol-Methyl Sal (SALONPAS) 3.06-21-08 % PTCH Place 1 application onto the skin daily as needed (pain).     levothyroxine (SYNTHROID) 88 MCG tablet Take 1 tablet (88 mcg total) by mouth daily before breakfast. 90 tablet 1   lisinopril-hydrochlorothiazide (ZESTORETIC) 20-25 MG tablet Take 1 tablet by mouth daily.     Polyethyl Glycol-Propyl Glycol (SYSTANE OP) Place 1 drop into both eyes 2 (two) times daily as needed (dry eyes).     traMADol (ULTRAM) 50 MG tablet Take 50 mg by mouth every 6 (six) hours as needed.     trolamine salicylate (ASPERCREME) 10 % cream Apply 1 application topically as needed for muscle pain.     No current facility-administered medications for this visit.    ALLERGIES:  No Known Allergies  PHYSICAL EXAM:  Performance status (ECOG): 1 - Symptomatic but completely ambulatory  Vitals:   07/03/22 1524  BP: (!) 147/65  Pulse: 60  Resp: 18  Temp: 98 F (36.7 C)  SpO2: 100%   Wt Readings from Last 3 Encounters:  07/03/22 159 lb 9.6 oz (72.4 kg)  05/27/22 158 lb 6.4 oz (71.8 kg)  02/04/22 156 lb 12.8 oz (71.1 kg)   Physical Exam Vitals reviewed.  Constitutional:      Appearance: Normal appearance.  Neck:     Comments: Thyroidectomy scar within normal limits Cardiovascular:     Rate and Rhythm: Normal rate and regular rhythm.     Pulses: Normal pulses.     Heart sounds: Normal heart sounds.  Pulmonary:     Effort: Pulmonary effort is normal.     Breath sounds: Normal breath sounds.  Neurological:     General: No focal deficit present.     Mental Status: She is alert and oriented  to person, place, and time.  Psychiatric:        Mood and Affect: Mood normal.        Behavior: Behavior normal.      LABORATORY DATA:  I have reviewed the labs as listed.     Latest Ref Rng & Units 06/27/2022    1:30 PM 03/27/2021    5:26 AM 03/21/2021    8:55 AM  CBC  WBC 4.0 - 10.5 K/uL 6.8  8.7  5.3   Hemoglobin 12.0 - 15.0 g/dL 12.3  11.9  13.0   Hematocrit 36.0 - 46.0 % 37.1  35.3  39.4   Platelets 150 - 400 K/uL 309  293  313       Latest Ref Rng & Units 06/27/2022    1:30 PM 05/02/2022  9:28 AM 09/16/2021    2:20 PM  CMP  Glucose 70 - 99 mg/dL 94  97    BUN 8 - 23 mg/dL 24  17    Creatinine 0.44 - 1.00 mg/dL 1.05  1.16  1.30   Sodium 135 - 145 mmol/L 138  141    Potassium 3.5 - 5.1 mmol/L 3.3  3.6    Chloride 98 - 111 mmol/L 101  98    CO2 22 - 32 mmol/L 26  27    Calcium 8.9 - 10.3 mg/dL 9.5  9.9    Total Protein 6.5 - 8.1 g/dL 7.8  7.5    Total Bilirubin 0.3 - 1.2 mg/dL 0.6  0.5    Alkaline Phos 38 - 126 U/L 61  62    AST 15 - 41 U/L 19  15    ALT 0 - 44 U/L 15  9      DIAGNOSTIC IMAGING:  I have independently reviewed the scans and discussed with the patient. No results found.   ASSESSMENT:  Medullary thyroid carcinoma: - History of RAI induced hypothyroidism for the last 20 years. - FNA of multinodular goiter in June 2022 showed positive for malignancy. - Total thyroidectomy with limited central compartment lymph node dissection by Theresa Buchanan on 03/26/2021 - Pathology shows medullary thyroid cancer, 3.1 x 1.5 x 1.5 cm, tumor confined within the thyroid capsule, margins negative.  0/1 perithyroidal lymph node negative.  1/1 metastatic medullary carcinoma in the right paratracheal lymph node.  1/4 metastatic lymph node in the central compartment.  pT2, PN1a. - CT CAP with contrast on 04/09/2021 showed hypodense hepatic lesions, very small seen throughout the right hepatic lobe, some of which are low-density.  Heterogeneous enhancing lesion in the lower pole  of the right kidney measuring 18 x 14 mm.  Second area in the medial right kidney measuring 18 x 18 mm.  8 mm solid-appearing lesion in the inferior right kidney lower pole.  Pulmonary hamartoma in the right lower lobe measuring 3 cm. - Labs on 04/03/2021 with calcitonin 136 and CEA 62.9. - NGS testing on 04/25/2021-BRAF negative, RET fusion not detected.  MSI-stable.  MMR-proficient.  [place] 1/2/3 not detected.  TMB low.  PD-L1 is negative.  HRAS pathogenic variant exon 3. - 24-hour urine metanephrines and plasma metanephrines normal. - Bone scan from 04/22/2021 showed punctate area of increased uptake projected over right posterior 12th rib, can also represent renal activity. - Rib series on the right side did not show any acute or focal rib abnormality.    Family/social history: - Lives at home with her husband.  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  She is non-smoker. - Her mother and sister had thyroid problems but no history of malignancies.   PLAN:  PT 2 PN 1A medullary thyroid carcinoma: -She was seen by Theresa Buchanan for elevated calcitonin levels. - She was referred to head and neck surgery.  She was seen by Dr. Constance Holster who did not feel that she needs surgery. - I have reviewed the calcitonin levels from 05/02/2022 (106) and recent value of 109 on 06/27/2022.  CEA is 4.0.  LFTs are normal. - I have recommended CT soft tissue neck with contrast and a gallium-68 dotatate PET scan as her previous imaging did not show any metastatic disease.   2.  Right kidney lesion: - MRI on 04/16/2021 showed lower pole right renal lesion favored to represent cystic renal cell carcinoma. -  Will consider further imaging with MRI which will be ordered at next visit.  She has claustrophobia and anxiety.   Orders placed this encounter:  No orders of the defined types were placed in this encounter.    Theresa Jack, MD [facility] [phone]

## 2022-07-15 ENCOUNTER — Other Ambulatory Visit: Payer: Self-pay

## 2022-07-15 DIAGNOSIS — C73 Malignant neoplasm of thyroid gland: Secondary | ICD-10-CM

## 2022-07-15 NOTE — Progress Notes (Signed)
CT CAP order placed per Dr. Delton Coombes

## 2022-07-17 ENCOUNTER — Encounter (HOSPITAL_COMMUNITY): Payer: Medicare HMO

## 2022-07-17 ENCOUNTER — Ambulatory Visit (HOSPITAL_COMMUNITY)
Admission: RE | Admit: 2022-07-17 | Discharge: 2022-07-17 | Disposition: A | Discharge status: Home / Self Care | Payer: Medicare HMO | Source: Ambulatory Visit | Attending: Hematology | Admitting: Hematology

## 2022-07-17 DIAGNOSIS — C73 Malignant neoplasm of thyroid gland: Secondary | ICD-10-CM

## 2022-07-17 MED ORDER — IOHEXOL 300 MG/ML  SOLN
75.0000 mL | Freq: Once | INTRAMUSCULAR | Status: AC | PRN
Start: 2022-07-17 — End: 2022-07-17
  Administered 2022-07-17: 75 mL via INTRAVENOUS

## 2022-07-23 ENCOUNTER — Inpatient Hospital Stay: Payer: Medicare HMO | Attending: Hematology | Admitting: Hematology

## 2022-07-23 VITALS — BP 158/75 | HR 80 | Temp 98.6°F | Resp 18 | Ht 65.0 in | Wt 160.9 lb

## 2022-07-23 DIAGNOSIS — I10 Essential (primary) hypertension: Secondary | ICD-10-CM | POA: Insufficient documentation

## 2022-07-23 DIAGNOSIS — Z90710 Acquired absence of both cervix and uterus: Secondary | ICD-10-CM | POA: Diagnosis not present

## 2022-07-23 DIAGNOSIS — N2889 Other specified disorders of kidney and ureter: Secondary | ICD-10-CM | POA: Diagnosis not present

## 2022-07-23 DIAGNOSIS — C73 Malignant neoplasm of thyroid gland: Secondary | ICD-10-CM | POA: Diagnosis not present

## 2022-07-23 NOTE — Progress Notes (Signed)
Theresa Buchanan, Theresa Buchanan   CLINIC:  Medical Oncology/Hematology  PCP:  Theresa Samples, Theresa Buchanan [address] / [place] [address] [phone]   REASON FOR VISIT:  Follow-up for medullary thyroid carcinoma  PRIOR THERAPY: Total thyroidectomy with limited central compartment lymph node dissection by Dr. Harlow Asa on 03/26/2021  NGS Results: not done  CURRENT THERAPY: surveillance  BRIEF ONCOLOGIC HISTORY:  Oncology History  Medullary thyroid carcinoma ([place])  03/17/2021 Initial Diagnosis   Medullary thyroid carcinoma (HCC)    Genetic Testing   Negative genetic testing. No pathogenic variants identified on the [facility] Multi-Cancer+RNA panel. VUS in SUFU called c.865C>T identified. The report date is 05/17/2021.  The Multi-Cancer Panel + RNA offered by [facility] includes sequencing and/or deletion duplication testing of the following 84 genes: AIP, ALK, APC, ATM, AXIN2,BAP1,  BARD1, BLM, BMPR1A, BRCA1, BRCA2, BRIP1, CASR, CDC73, CDH1, CDK4, CDKN1B, CDKN1C, CDKN2A (p14ARF), CDKN2A (p16INK4a), CEBPA, CHEK2, CTNNA1, DICER1, DIS3L2, EGFR (c.2369C>T, p.Thr790Met variant only), EPCAM (Deletion/duplication testing only), FH, FLCN, GATA2, GPC3, GREM1 (Promoter region deletion/duplication testing only), HOXB13 (c.251G>A, p.Gly84Glu), HRAS, KIT, MAX, MEN1, MET, MITF (c.952G>A, p.Glu318Lys variant only), MLH1, MSH2, MSH3, MSH6, MUTYH, NBN, NF1, NF2, NTHL1, PALB2, PDGFRA, PHOX2B, PMS2, POLD1, POLE, POT1, PRKAR1A, PTCH1, PTEN, RAD50, RAD51C, RAD51D, RB1, RECQL4, RET, RUNX1, SDHAF2, SDHA (sequence changes only), SDHB, SDHC, SDHD, SMAD4, SMARCA4, SMARCB1, SMARCE1, STK11, SUFU, TERC, TERT, TMEM127, TP53, TSC1, TSC2, VHL, WRN and WT1.     CANCER STAGING:  Cancer Staging  Medullary thyroid carcinoma [facility]) Staging form: Thyroid - Medullary, AJCC 8th Edition - Clinical stage from 04/15/2021: Stage IVC (cT2, cN1a, cM1) - Unsigned   INTERVAL HISTORY:   Theresa Buchanan, a 77 y.o. female, seen for follow-up of medullary thyroid carcinoma. She was last seen by me on 07/03/22.  Today, she states that she is doing well overall. Her appetite level is at 70%. Her energy level is at 40%. She reports 6/10 neck pain which radiates into the back of her head. She has reported to have neck pain for years. Her primary doctor had xrays done which revealed c-spine arthritis. She reports compliance with Synthroid. She is scheduled to see her endocrinologist Dr. Loni Beckwith on 08/28/22.  REVIEW OF SYSTEMS:  Review of Systems  Constitutional:  Negative for chills, fatigue and fever.  HENT:   Negative for lump/mass, mouth sores, nosebleeds, sore throat and trouble swallowing.   Eyes:  Negative for eye problems.  Respiratory:  Negative for cough and shortness of breath.   Cardiovascular:  Negative for chest pain, leg swelling and palpitations.  Gastrointestinal:  Negative for abdominal pain, constipation, diarrhea, nausea and vomiting.  Genitourinary:  Negative for bladder incontinence, difficulty urinating, dysuria, frequency, hematuria and nocturia.   Musculoskeletal:  Positive for neck pain. Negative for arthralgias, back pain, flank pain and myalgias.  Skin:  Negative for itching and rash.  Neurological:  Negative for dizziness, headaches and numbness.  Hematological:  Does not bruise/bleed easily.  Psychiatric/Behavioral:  Negative for depression, sleep disturbance and suicidal ideas. The patient is not nervous/anxious.   All other systems reviewed and are negative.   PAST MEDICAL/SURGICAL HISTORY:  Past Medical History:  Diagnosis Date   Family history of cancer    Hypertension    Thyroid disease    Past Surgical History:  Procedure Laterality Date   ABDOMINAL HYSTERECTOMY     COLON SURGERY     THYROIDECTOMY N/A 03/26/2021   Procedure: TOTAL THYROIDECTOMY WITH LIMITED  LYMPH NODE DISSECTION;  Surgeon: Armandina Gemma, MD;  Location: [facility];  Service: General;  Laterality: N/A;   TUBAL LIGATION      SOCIAL HISTORY:  Social History   Socioeconomic History   Marital status: Married    Spouse name: Not on file   Number of children: Not on file   Years of education: Not on file   Highest education level: Not on file  Occupational History   Not on file  Tobacco Use   Smoking status: Never   Smokeless tobacco: Never  Vaping Use   Vaping Use: Never used  Substance and Sexual Activity   Alcohol use: Never   Drug use: Never   Sexual activity: Not on file  Other Topics Concern   Not on file  Social History Narrative   Not on file   Social Determinants of Health   Financial Resource Strain: Not on file  Food Insecurity: Not on file  Transportation Needs: Not on file  Physical Activity: Not on file  Stress: Not on file  Social Connections: Not on file  Intimate Partner Violence: Not on file    FAMILY HISTORY:  Family History  Problem Relation Age of Onset   Hypertension Mother    Thyroid disease Mother    Diabetes Mother    Cancer Maternal Aunt        unk types   Cancer Maternal Uncle        unk types    CURRENT MEDICATIONS:  Current Outpatient Medications  Medication Sig Dispense Refill   aspirin EC 81 MG tablet Take 81 mg by mouth 2 (two) times a week.     calcium carbonate (TUMS) 500 MG chewable tablet Chew 2 tablets (400 mg of elemental calcium total) by mouth 2 (two) times daily. 90 tablet 1   Camphor-Menthol-Methyl Sal (SALONPAS) 3.06-21-08 % PTCH Place 1 application onto the skin daily as needed (pain).     levothyroxine (SYNTHROID) 88 MCG tablet Take 1 tablet (88 mcg total) by mouth daily before breakfast. 90 tablet 1   lisinopril-hydrochlorothiazide (ZESTORETIC) 20-25 MG tablet Take 1 tablet by mouth daily.     Polyethyl Glycol-Propyl Glycol (SYSTANE OP) Place 1 drop into both eyes 2 (two) times daily as needed (dry eyes).     traMADol (ULTRAM) 50 MG tablet Take 50 mg by mouth every 6 (six)  hours as needed.     trolamine salicylate (ASPERCREME) 10 % cream Apply 1 application topically as needed for muscle pain.     No current facility-administered medications for this visit.    ALLERGIES:  No Known Allergies  PHYSICAL EXAM:  Performance status (ECOG): 1 - Symptomatic but completely ambulatory  There were no vitals filed for this visit.  Wt Readings from Last 3 Encounters:  07/03/22 72.4 kg (159 lb 9.6 oz)  05/27/22 71.8 kg (158 lb 6.4 oz)  02/04/22 71.1 kg (156 lb 12.8 oz)   Physical Exam Vitals reviewed. Exam conducted with a chaperone present.  Constitutional:      Appearance: Normal appearance.  Cardiovascular:     Rate and Rhythm: Normal rate and regular rhythm.     Pulses: Normal pulses.     Heart sounds: Normal heart sounds.  Pulmonary:     Effort: Pulmonary effort is normal.     Breath sounds: Normal breath sounds.  Abdominal:     Palpations: Abdomen is soft. There is no hepatomegaly, splenomegaly or mass.     Tenderness: There is no abdominal tenderness.  Musculoskeletal:     Right lower leg: No edema.     Left lower leg: No edema.  Lymphadenopathy:     Cervical: No cervical adenopathy.     Right cervical: No superficial, deep or posterior cervical adenopathy.    Left cervical: No superficial, deep or posterior cervical adenopathy.     Upper Body:     Right upper body: No supraclavicular, axillary or pectoral adenopathy.     Left upper body: No supraclavicular, axillary or pectoral adenopathy.  Neurological:     General: No focal deficit present.     Mental Status: She is alert and oriented to person, place, and time.  Psychiatric:        Mood and Affect: Mood normal.        Behavior: Behavior normal.      LABORATORY DATA:  I have reviewed the labs as listed.     Latest Ref Rng & Units 06/27/2022    1:30 PM 03/27/2021    5:26 AM 03/21/2021    8:55 AM  CBC  WBC 4.0 - 10.5 K/uL 6.8  8.7  5.3   Hemoglobin 12.0 - 15.0 g/dL 12.3  11.9  13.0    Hematocrit 36.0 - 46.0 % 37.1  35.3  39.4   Platelets 150 - 400 K/uL 309  293  313       Latest Ref Rng & Units 06/27/2022    1:30 PM 05/02/2022    9:28 AM 09/16/2021    2:20 PM  CMP  Glucose 70 - 99 mg/dL 94  97    BUN 8 - 23 mg/dL 24  17    Creatinine 0.44 - 1.00 mg/dL 1.05  1.16  1.30   Sodium 135 - 145 mmol/L 138  141    Potassium 3.5 - 5.1 mmol/L 3.3  3.6    Chloride 98 - 111 mmol/L 101  98    CO2 22 - 32 mmol/L 26  27    Calcium 8.9 - 10.3 mg/dL 9.5  9.9    Total Protein 6.5 - 8.1 g/dL 7.8  7.5    Total Bilirubin 0.3 - 1.2 mg/dL 0.6  0.5    Alkaline Phos 38 - 126 U/L 61  62    AST 15 - 41 U/L 19  15    ALT 0 - 44 U/L 15  9     Lab Results  Component Value Date   TSH 16.043 (H) 06/27/2022    Component     Latest Ref Rng 07/30/2021 05/02/2022 06/27/2022  Calcitonin     0.0 - 5.0 pg/mL 95.0 (H)  106.0 (H)  109.0 (H)      DIAGNOSTIC IMAGING:  I have independently reviewed the scans and discussed with the patient. CT SOFT TISSUE NECK W CONTRAST  Result Date: 07/17/2022 CLINICAL DATA:  Medullary thyroid carcinoma. EXAM: CT NECK WITH CONTRAST TECHNIQUE: Multidetector CT imaging of the neck was performed using the standard protocol following the bolus administration of intravenous contrast. RADIATION DOSE REDUCTION: This exam was performed according to the departmental dose-optimization program which includes automated exposure control, adjustment of the mA and/or kV according to patient size and/or use of iterative reconstruction technique. CONTRAST:  108m OMNIPAQUE IOHEXOL 300 MG/ML  SOLN COMPARISON:  Neck CT 09/16/2021 FINDINGS: Pharynx and larynx: No evidence of a mass or swelling. No fluid collection or inflammatory changes in the parapharyngeal or retropharyngeal spaces. Salivary glands: No inflammation, mass, or stone. Thyroid: Status post total thyroidectomy. No mass identified in  the thyroidectomy bed. Lymph nodes: No enlarged or suspicious lymph nodes in the neck. Vascular:  Major vascular structures of the neck are grossly patent. 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 Limited intracranial: Unremarkable. Visualized orbits: Unremarkable. Mastoids and visualized paranasal sinuses: Clear. Skeleton: No acute osseous abnormality or suspicious osseous lesion. Mild cervical spondylosis. Upper chest: Clear lung apices. Other: Decreased size of the presumed epidermal inclusion cyst overlying the medial left clavicle on the prior study. IMPRESSION: No evidence of recurrent disease in the neck. Electronically Signed   By: Logan Bores M.D.   On: 07/17/2022 13:44     ASSESSMENT:  Medullary thyroid carcinoma: - History of RAI induced hypothyroidism for the last 20 years. - FNA of multinodular goiter in June 2022 showed positive for malignancy. - Total thyroidectomy with limited central compartment lymph node dissection by Dr. Harlow Asa on 03/26/2021 - Pathology shows medullary thyroid cancer, 3.1 x 1.5 x 1.5 cm, tumor confined within the thyroid capsule, margins negative.  0/1 perithyroidal lymph node negative.  1/1 metastatic medullary carcinoma in the right paratracheal lymph node.  1/4 metastatic lymph node in the central compartment.  pT2, PN1a. - CT CAP with contrast on 04/09/2021 showed hypodense hepatic lesions, very small seen throughout the right hepatic lobe, some of which are low-density.  Heterogeneous enhancing lesion in the lower pole of the right kidney measuring 18 x 14 mm.  Second area in the medial right kidney measuring 18 x 18 mm.  8 mm solid-appearing lesion in the inferior right kidney lower pole.  Pulmonary hamartoma in the right lower lobe measuring 3 cm. - Labs on 04/03/2021 with calcitonin 136 and CEA 62.9. - NGS testing on 04/25/2021-BRAF negative, RET fusion not detected.  MSI-stable.  MMR-proficient.  [place] 1/2/3 not detected.  TMB low.  PD-L1 is negative.  HRAS pathogenic variant exon 3. - 24-hour urine metanephrines and plasma metanephrines  normal. - Bone scan from 04/22/2021 showed punctate area of increased uptake projected over right posterior 12th rib, can also represent renal activity. - Rib series on the right side did not show any acute or focal rib abnormality. - She was referred to Dr. Harlow Asa for elevated calcitonin levels. - Dr. Harlow Asa referred her to Dr. Constance Holster who did not feel that she needs surgery. - She continues to have elevated calcitonin levels with normal CEA level.    Family/social history: - Lives at home with her husband.  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  She is non-smoker. - Her mother and sister had thyroid problems but no history of malignancies.   PLAN:  PT 2 PN 1A medullary thyroid carcinoma: - Due to elevated calcitonin levels, I have recommended dotatate PET scan.  Her insurance has denied it. - Reviewed labs from 06/27/2022 which showed normal LFTs.  Mildly elevated creatinine is stable.  CBC was normal.  Calcitonin is 109 and CEA was 4. - CT soft tissue neck (07/17/2022): No evidence of recurrence in the neck. - Recommend CT CAP with contrast followed by phone visit.   2.  Right kidney lesion: - Previous MRI on 04/16/2021 showed lower pole right renal lesion favored to represent cystic renal cell carcinoma. - We will follow-up on the CT scan.   Orders placed this encounter:  No orders of the defined types were placed in this encounter.   I,Alexis Herring,acting as a Education administrator for [employer], MD.,have documented all relevant documentation on the behalf of Derek Jack, MD,as directed  by  Derek Jack, MD while in the presence of Derek Jack, MD.  I, Derek Jack MD, have reviewed the above documentation for accuracy and completeness, and I agree with the above.   Derek Jack, MD [place] [phone]

## 2022-07-23 NOTE — Patient Instructions (Signed)
[place]  Discharge Instructions  You were seen and examined today by Dr. Delton Coombes.  Dr. Delton Coombes discussed your most recent lab work and CT scan which revealed that everything looks good.  Dr. Delton Coombes is going to get you scheduled for a CT of your chest, abdomen, and pelvis since your insurance denied the PET scan.  Follow-up as scheduled by phone visit after scan.    Thank you for choosing [facility] to provide your oncology and hematology care.   To afford each patient quality time with our provider, please arrive at least 15 minutes before your scheduled appointment time. You may need to reschedule your appointment if you arrive late (10 or more minutes). Arriving late affects you and other patients whose appointments are after yours.  Also, if you miss three or more appointments without notifying the office, you may be dismissed from the clinic at the provider's discretion.    Again, thank you for choosing [facility].  Our hope is that these requests will decrease the amount of time that you wait before being seen by our physicians.   If you have a lab appointment with the [facility] please come in thru the Main Entrance and check in at the main information desk.           _____________________________________________________________  Should you have questions after your visit to [facility], please contact our office at [phone] and follow the prompts.  Our office hours are 8:00 a.m. to 4:30 p.m. Monday - Thursday and 8:00 a.m. to 2:30 p.m. Friday.  Please note that voicemails left after 4:00 p.m. may not be returned until the following business day.  We are closed weekends and all major holidays.  You do have access to a nurse 24-7, just call the main number to the clinic [phone] and do not press any options, hold on the line and a nurse will answer the phone.    For  prescription refill requests, have your pharmacy contact our office and allow 72 hours.    Masks are optional in the cancer centers. If you would like for your care team to wear a mask while they are taking care of you, please let them know. You may have one support person who is at least 75 years old accompany you for your appointments.

## 2022-08-20 DIAGNOSIS — C73 Malignant neoplasm of thyroid gland: Secondary | ICD-10-CM | POA: Diagnosis not present

## 2022-08-20 DIAGNOSIS — E890 Postprocedural hypothyroidism: Secondary | ICD-10-CM | POA: Diagnosis not present

## 2022-08-22 LAB — TSH: TSH: 15.2 u[IU]/mL — ABNORMAL HIGH (ref 0.450–4.500)

## 2022-08-22 LAB — T4, FREE: Free T4: 2.02 ng/dL — ABNORMAL HIGH (ref 0.82–1.77)

## 2022-08-22 LAB — CEA: CEA: 4.2 ng/mL (ref 0.0–4.7)

## 2022-08-22 LAB — CALCITONIN: Calcitonin: 86.7 pg/mL — ABNORMAL HIGH (ref 0.0–5.0)

## 2022-08-28 ENCOUNTER — Encounter: Payer: Self-pay | Admitting: "Endocrinology

## 2022-08-28 ENCOUNTER — Ambulatory Visit: Payer: Medicare HMO | Admitting: "Endocrinology

## 2022-08-28 VITALS — BP 140/82 | HR 80 | Ht 65.0 in | Wt 159.0 lb

## 2022-08-28 DIAGNOSIS — C73 Malignant neoplasm of thyroid gland: Secondary | ICD-10-CM

## 2022-08-28 DIAGNOSIS — E890 Postprocedural hypothyroidism: Secondary | ICD-10-CM

## 2022-08-28 MED ORDER — LEVOTHYROXINE SODIUM 75 MCG PO TABS: 75.0000 ug | ORAL_TABLET | Freq: Every day | ORAL | 1 refills | Status: DC

## 2022-08-28 NOTE — Progress Notes (Signed)
08/28/2022, 1:19 PM  Endocrinology follow-up note   Subjective:    Patient ID: Theresa Buchanan, female    DOB: 17-Oct-1945, PCP Jake Samples, PA-C   Past Medical History:  Diagnosis Date   Family history of cancer    Hypertension    Thyroid disease    Past Surgical History:  Procedure Laterality Date   ABDOMINAL HYSTERECTOMY     COLON SURGERY     THYROIDECTOMY N/A 03/26/2021   Procedure: TOTAL THYROIDECTOMY WITH LIMITED LYMPH NODE DISSECTION;  Surgeon: Armandina Gemma, MD;  Location: [facility];  Service: General;  Laterality: N/A;   TUBAL LIGATION     Social History   Socioeconomic History   Marital status: Married    Spouse name: Not on file   Number of children: Not on file   Years of education: Not on file   Highest education level: Not on file  Occupational History   Not on file  Tobacco Use   Smoking status: Never   Smokeless tobacco: Never  Vaping Use   Vaping Use: Never used  Substance and Sexual Activity   Alcohol use: Never   Drug use: Never   Sexual activity: Not on file  Other Topics Concern   Not on file  Social History Narrative   Not on file   Social Determinants of Health   Financial Resource Strain: Not on file  Food Insecurity: Not on file  Transportation Needs: Not on file  Physical Activity: Not on file  Stress: Not on file  Social Connections: Not on file   Family History  Problem Relation Age of Onset   Hypertension Mother    Thyroid disease Mother    Diabetes Mother    Cancer Maternal Aunt        unk types   Cancer Maternal Uncle        unk types   Outpatient Encounter Medications as of 08/28/2022  Medication Sig   aspirin EC 81 MG tablet Take 81 mg by mouth 2 (two) times a week.   calcium carbonate (TUMS) 500 MG chewable tablet Chew 2 tablets (400 mg of elemental calcium total) by mouth 2 (two) times daily.   Camphor-Menthol-Methyl Sal (SALONPAS) 3.06-21-08 % PTCH Place 1  application onto the skin daily as needed (pain).   levothyroxine (SYNTHROID) 75 MCG tablet Take 1 tablet (75 mcg total) by mouth daily before breakfast.   lisinopril-hydrochlorothiazide (ZESTORETIC) 20-25 MG tablet Take 1 tablet by mouth daily.   Polyethyl Glycol-Propyl Glycol (SYSTANE OP) Place 1 drop into both eyes 2 (two) times daily as needed (dry eyes).   traMADol (ULTRAM) 50 MG tablet Take 50 mg by mouth every 6 (six) hours as needed.   trolamine salicylate (ASPERCREME) 10 % cream Apply 1 application topically as needed for muscle pain.   [DISCONTINUED] levothyroxine (SYNTHROID) 88 MCG tablet Take 1 tablet (88 mcg total) by mouth daily before breakfast.   No facility-administered encounter medications on file as of 08/28/2022.   ALLERGIES: No Known Allergies  VACCINATION STATUS:  There is no immunization history on file for this patient.  HPI Theresa Buchanan is 77 y.o. female who presents today with a medical history as above. she follows in this  clinic for RAI induced hypothyroidism.  Currently on levothyroxine 88 mcg p.o. daily before breakfast.  Her previsit labs are consistent with slight overtreatment.    His treatment was around 2006.  However, she was later found to have  multinodular goiter recently, in February 2022. Fine-needle aspiration in June 2022 showed thyroid malignancy. She was sent for total thyroidectomy which she underwent on March 26, 2021.  She has recovered from her surgery.  Her surgical sample showed medullary thyroid cancer measuring 3.1 cm, on the right lobe. Metastatic medullary thyroid carcinoma was found in 1 lymph node on the right paratracheal groove, and 1 out of 4 positive lymph node in the central compartment. She followed  with her oncologist Dr. Delton Coombes.  She has gone through staging imaging, genetic testing.  Her genetic testing did not show any pathological mutations-hereditary cause was not determined.  Calcitonin continues to be elevated,  currently at 86.7, has been as high as 136 in October 2022.  Her CEA is down to 4.2, has been as high as 62.9 in October 2022.   She has not received any adjuvant chemotherapy agent yet. Her CT scan showed some lesions in the right hepatic lobe, small lesions in around the right kidney, pulmonary lesions said to be hematoma, 10 mm rounded lesion along the anterior chest wall below the thyroid .  She was evaluated by ENT surgeon with repeat CT scan.  She was not found to have identifiable surgically amenable lesion.  She has left thoracic wall lesion which was thought to be cyst.  Seen Dr. Delton Coombes last month, planning to do more imaging studies. She denies palpitations, tremor, nor heat/cold intolerance. She denies family history of thyroid malignancy, however reports various forms of thyroid dysfunction in her family members including her mother and sisters.    Presurgical chest x-ray showed 3.3 cm mass which was later said to be hamartoma.    CT scan however showed questionable renal lesion, unspecified liver lesions which are not seen on the MRI.  Review of Systems  Constitutional: + Steady weight,  no fatigue, no subjective hyperthermia, no subjective hypothermia Eyes: no blurry vision, no xerophthalmia ENT: no sore throat, no nodules palpated in throat, no dysphagia/odynophagia, no hoarseness   Objective:       08/28/2022   10:21 AM 07/23/2022    2:59 PM 07/03/2022    3:24 PM  Vitals with BMI  Height '5\' 5"'$  '5\' 5"'$    Weight 159 lbs 160 lbs 14 oz 159 lbs 10 oz  BMI [id number]   Systolic [id number]  Diastolic 82 75 65  Pulse 80 80 60    BP (!) 140/82   Pulse 80   Ht '5\' 5"'$  (1.651 m)   Wt 159 lb (72.1 kg)   BMI 26.46 kg/m   Wt Readings from Last 3 Encounters:  08/28/22 159 lb (72.1 kg)  07/23/22 160 lb 14.4 oz (73 kg)  07/03/22 159 lb 9.6 oz (72.4 kg)    Physical Exam  Constitutional:  Body mass index is 26.46 kg/m.,  not in acute distress, normal state of  mind Eyes: PERRLA, EOMI, no exophthalmos ENT: moist mucous membranes, + post thyroidectomy scar, no gross cervical lymphadenopathy   Thyroid ultrasound August 06, 2020:  Right lobe 3.1 cm with 2.1 suspicious nodule. Left lobe 3.4 cm with 1.2 cm nonsuspicious nodule.  Fine-needle aspiration on December 12, 2020 of right-sided thyroid nodule Clinical History: 2.1 cm RML  Specimen Submitted:  A. THYROID, RIGHT, FINE  NEEDLE ASPIRATION:   FINAL MICROSCOPIC DIAGNOSIS:  - Malignant cells present (Bethesda category VI)   SPECIMEN ADEQUACY:  Satisfactory for evaluation    Surgical sample from March 26, 2021 FINAL MICROSCOPIC DIAGNOSIS:   A. THYROID, TOTAL THYROIDECTOMY:  - Medullary thyroid carcinoma, 3.1 cm.  - Tumor confined within the thyroid capsule.  - Margins not involved.  - One perithyroidal lymph node negative for metastatic carcinoma (0/1).  - See oncology table.   B. LYMPH NODE, RIGHT PARATRACHEAL, EXCISION:  - Metastatic medullary carcinoma in one lymph node (1/1).   C. LYMPH NODE, CENTRAL COMPARTMENT, EXCISION:  - Metastatic medullary carcinoma in one of four lymph nodes (1/4).   April 26, 2021 MRI abdomen with and without contrast:  IMPRESSION: 1. Lower pole right renal lesion is favored to represent a cystic renal cell carcinoma. Bosniak IV (2019). An adjacent area of more central precontrast T1 hyperintensity may represent a separate hemorrhagic cyst or less likely be part of the exophytic lesion. 2. Central upper pole right renal lesion is favored to represent a complex renal sinus cyst but is indeterminate. Presuming the patient does not undergo complete nephrectomy, recommend follow-up pre and post contrast abdominal MRI at 6 months. 3. No suspicious liver lesion. 4. No evidence of abdominal metastatic disease. 5. Right lower lobe hamartoma. 6.  Aortic Atherosclerosis (ICD10-I70.0).    Assessment & Plan:   1.  Medullary thyroid carcinoma 2.   Hypothyroidism following radioiodine therapy   For her postsurgical hypothyroidism, she is advised to lower her levothyroxine 75 mcg p.o. daily before breakfast.   - We discussed about the correct intake of her thyroid hormone, on empty stomach at fasting, with water, separated by at least 30 minutes from breakfast and other medications,  and separated by more than 4 hours from calcium, iron, multivitamins, acid reflux medications (PPIs). -Patient is made aware of the fact that thyroid hormone replacement is needed for life, dose to be adjusted by periodic monitoring of thyroid function tests.  -I reviewed her interval imaging studies, genetic testing and lab work. - Her surgical cytology revealed locally invasive, possibly distant metastatic medullary thyroid cancer-involving at least 2 lymph nodes in the neck.  She is status post total thyroidectomy with limited neck dissection.  She was subsequently worked up for staging imaging as well as genetic testing.  Genetic test is not showing hereditary cause.  Her imaging studies are abnormal along with persistently elevated calcitonin currently at 86.7, also dropping from 109 in January 2024.  Her CEA is 4.2 remains stable.    Was previously seen by ENT surgeon Dr. Lucien Mons. CT neck/thorax did not reveal an identifiable surgical lesion.  Left thoracic wall lesion was described as possible epidermal inclusion  cyst.   She has not undergone any additional surgery chemotherapy yet.  She is currently undergoing further imaging studies.  Original did not provide coverage for PET scan.  There is no utility for I-131 thyroid remnant ablation for medullary thyroid cancer.   - she is advised to maintain close follow up with Jake Samples, PA-C for primary care needs.   I spent  21  minutes in the care of the patient today including review of labs from Thyroid Function, CMP, and other relevant labs ; imaging/biopsy records (current and previous  including abstractions from other facilities); face-to-face time discussing  her lab results and symptoms, medications doses, her options of short and long term treatment based on the latest standards of care / guidelines;  and documenting the encounter.  Jenny Reichmann [place]  participated in the discussions, expressed understanding, and voiced agreement with the above plans.  All questions were answered to her satisfaction. she is encouraged to contact clinic should she have any questions or concerns prior to her return visit.   Follow up plan: Return in about 3 months (around 11/28/2022) for F/U with Pre-visit Labs.   Glade Lloyd, MD [facility] [address] [place], [place] [address] Phone: [phone]  Fax: [phone]     08/28/2022, 1:19 PM  This note was partially dictated with voice recognition software. Similar sounding words can be transcribed inadequately or may not  be corrected upon review.

## 2022-09-08 ENCOUNTER — Ambulatory Visit (HOSPITAL_COMMUNITY)
Admission: RE | Admit: 2022-09-08 | Discharge: 2022-09-08 | Disposition: A | Discharge status: Home / Self Care | Payer: Medicare HMO | Source: Ambulatory Visit | Attending: Hematology | Admitting: Hematology

## 2022-09-08 DIAGNOSIS — K760 Fatty (change of) liver, not elsewhere classified: Secondary | ICD-10-CM | POA: Diagnosis not present

## 2022-09-08 DIAGNOSIS — K7689 Other specified diseases of liver: Secondary | ICD-10-CM | POA: Diagnosis not present

## 2022-09-08 DIAGNOSIS — C73 Malignant neoplasm of thyroid gland: Secondary | ICD-10-CM

## 2022-09-08 DIAGNOSIS — N289 Disorder of kidney and ureter, unspecified: Secondary | ICD-10-CM | POA: Insufficient documentation

## 2022-09-08 DIAGNOSIS — R93422 Abnormal radiologic findings on diagnostic imaging of left kidney: Secondary | ICD-10-CM | POA: Diagnosis not present

## 2022-09-08 DIAGNOSIS — I517 Cardiomegaly: Secondary | ICD-10-CM | POA: Diagnosis not present

## 2022-09-08 DIAGNOSIS — Q859 Phakomatosis, unspecified: Secondary | ICD-10-CM | POA: Diagnosis not present

## 2022-09-08 DIAGNOSIS — I700 Atherosclerosis of aorta: Secondary | ICD-10-CM | POA: Diagnosis not present

## 2022-09-08 MED ORDER — IOHEXOL 300 MG/ML  SOLN
100.0000 mL | Freq: Once | INTRAMUSCULAR | Status: AC | PRN
  Administered 2022-09-08: 100 mL via INTRAVENOUS

## 2022-09-09 ENCOUNTER — Other Ambulatory Visit: Payer: Self-pay

## 2022-09-09 DIAGNOSIS — N2889 Other specified disorders of kidney and ureter: Secondary | ICD-10-CM

## 2022-09-09 NOTE — Progress Notes (Signed)
Order placed for MRI abdomen per Dr. Delton Coombes verbal order.

## 2022-09-11 ENCOUNTER — Inpatient Hospital Stay: Payer: Medicare HMO | Attending: Hematology | Admitting: Hematology

## 2022-09-11 DIAGNOSIS — N289 Disorder of kidney and ureter, unspecified: Secondary | ICD-10-CM | POA: Diagnosis not present

## 2022-09-11 DIAGNOSIS — C73 Malignant neoplasm of thyroid gland: Secondary | ICD-10-CM

## 2022-09-11 NOTE — Progress Notes (Signed)
Virtual Visit via Telephone Note  I connected with Theresa Buchanan on 09/11/22 at  3:45 PM EDT by telephone and verified that I am speaking with the correct person using two identifiers.  Location: Patient: home Provider: clinic   I discussed the limitations, risks, security and privacy concerns of performing an evaluation and management service by telephone and the availability of in person appointments. I also discussed with the patient that there may be a patient responsible charge related to this service. The patient expressed understanding and agreed to proceed.   History of Present Illness: [name], a 77 y.o. female, seen for follow-up of medullary thyroid carcinoma. She was last seen by me on 07/23/22.   Observations/Objective:  Today, she states that she is doing well overall. Her appetite level is at 50%. Her energy level is at 50%.  She has shoulder pain which is chronic.  No new pains reported.  No hematuria.  Assessment and Plan:  ASSESSMENT:  Medullary thyroid carcinoma: - History of RAI induced hypothyroidism for the last 20 years. - FNA of multinodular goiter in June 2022 showed positive for malignancy. - Total thyroidectomy with limited central compartment lymph node dissection by Dr. Harlow Asa on 03/26/2021 - Pathology shows medullary thyroid cancer, 3.1 x 1.5 x 1.5 cm, tumor confined within the thyroid capsule, margins negative.  0/1 perithyroidal lymph node negative.  1/1 metastatic medullary carcinoma in the right paratracheal lymph node.  1/4 metastatic lymph node in the central compartment.  pT2, PN1a. - CT CAP with contrast on 04/09/2021 showed hypodense hepatic lesions, very small seen throughout the right hepatic lobe, some of which are low-density.  Heterogeneous enhancing lesion in the lower pole of the right kidney measuring 18 x 14 mm.  Second area in the medial right kidney measuring 18 x 18 mm.  8 mm solid-appearing lesion in the inferior right kidney  lower pole.  Pulmonary hamartoma in the right lower lobe measuring 3 cm. - Labs on 04/03/2021 with calcitonin 136 and CEA 62.9. - NGS testing on 04/25/2021-BRAF negative, RET fusion not detected.  MSI-stable.  MMR-proficient.  [place] 1/2/3 not detected.  TMB low.  PD-L1 is negative.  HRAS pathogenic variant exon 3. - 24-hour urine metanephrines and plasma metanephrines normal. - Bone scan from 04/22/2021 showed punctate area of increased uptake projected over right posterior 12th rib, can also represent renal activity. - Rib series on the right side did not show any acute or focal rib abnormality. - She was referred to Dr. Harlow Asa for elevated calcitonin levels. - Dr. Harlow Asa referred her to Dr. Constance Holster who did not feel that she needs surgery. - She continues to have elevated calcitonin levels with normal CEA level.    Family/social history: - Lives at home with her husband.  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  She is non-smoker. - Her mother and sister had thyroid problems but no history of malignancies.     PLAN:  PT 2 PN 1A medullary thyroid carcinoma: - Due to continued elevated calcitonin levels, I have recommended dotatate PET scan.  Her insurance denied it. - Reviewed CT CAP from 09/08/2022: No evidence of metastatic thyroid cancer.  Right lower lobe pulmonary hematoma.  Hepatic steatosis. - Calcitonin on 08/20/2022 was 86, down from 109.  CEA was normal at 4.2.   2.  Right kidney lesion: - Previous MRI on 04/16/2021 showed lower pole right renal lesion favored to represent cystic renal cell carcinoma. -  CT CAP on 09/08/2022 showed 3 suspicious lesions in the right kidney worrisome for cystic renal cell carcinoma. - We will order MRI of the abdomen with and without contrast.  Follow Up Instructions:    I discussed the assessment and treatment plan with the patient. The patient was provided an opportunity to ask questions and all were answered. The patient  agreed with the plan and demonstrated an understanding of the instructions.   The patient was advised to call back or seek an in-person evaluation if the symptoms worsen or if the condition fails to improve as anticipated.  I provided 21 minutes of non-face-to-face time during this encounter.   I,Alexis Herring,acting as a Education administrator for [employer], MD.,have documented all relevant documentation on the behalf of Derek Jack, MD,as directed by  Derek Jack, MD while in the presence of Derek Jack, MD.  I, Derek Jack MD, have reviewed the above documentation for accuracy and completeness, and I agree with the above.

## 2022-09-16 ENCOUNTER — Ambulatory Visit (HOSPITAL_COMMUNITY): Payer: Medicare HMO

## 2022-09-17 DIAGNOSIS — H43393 Other vitreous opacities, bilateral: Secondary | ICD-10-CM | POA: Diagnosis not present

## 2022-09-22 ENCOUNTER — Ambulatory Visit (HOSPITAL_COMMUNITY): Payer: Medicare HMO

## 2022-09-22 ENCOUNTER — Telehealth: Payer: Medicare HMO | Admitting: Hematology

## 2022-09-25 ENCOUNTER — Telehealth: Payer: Medicare HMO | Admitting: Hematology

## 2022-10-01 ENCOUNTER — Ambulatory Visit (HOSPITAL_COMMUNITY): Payer: Medicare HMO

## 2022-10-03 ENCOUNTER — Ambulatory Visit (HOSPITAL_COMMUNITY)
Admission: RE | Admit: 2022-10-03 | Discharge: 2022-10-03 | Disposition: A | Discharge status: Home / Self Care | Payer: Medicare HMO | Source: Ambulatory Visit | Attending: Hematology | Admitting: Hematology

## 2022-10-03 DIAGNOSIS — N2889 Other specified disorders of kidney and ureter: Secondary | ICD-10-CM

## 2022-10-03 DIAGNOSIS — N281 Cyst of kidney, acquired: Secondary | ICD-10-CM | POA: Diagnosis not present

## 2022-10-03 MED ORDER — GADOPICLENOL 0.5 MMOL/ML IV SOLN
7.5000 mL | Freq: Once | INTRAVENOUS | Status: AC | PRN
  Administered 2022-10-03: 7.5 mL via INTRAVENOUS

## 2022-10-07 ENCOUNTER — Inpatient Hospital Stay: Payer: Medicare HMO | Admitting: Hematology

## 2022-10-07 ENCOUNTER — Encounter: Payer: Self-pay | Admitting: *Deleted

## 2022-10-07 NOTE — Progress Notes (Signed)
Call report received from [facility] regarding abnormal findings on MR Abdomen.  Dr. Ellin Saba notified of results.

## 2022-10-13 IMAGING — US US FNA BIOPSY THYROID 1ST LESION
1 series · 13 of 19 positions shown · non-contrast
Comparison: US Thyroid 08/06/20

MEDICATIONS:
8 cc 1% lidocaine

COMPLICATIONS:
None immediate.

INDICATION: Right mid lobe thyroid nodule

2.1 cm
EXAM:
ULTRASOUND GUIDED FINE NEEDLE ASPIRATION OF INDETERMINATE THYROID
NODULE
TECHNIQUE: Informed written consent was obtained from the patient after a
discussion of the risks, benefits and alternatives to treatment.
Questions regarding the procedure were encouraged and answered. A
timeout was performed prior to the initiation of the procedure.

[Series 1: us fna bx thyroid 1st lesion afirma · 19 acquisitions, 13 frames shown]
[im 1/19]
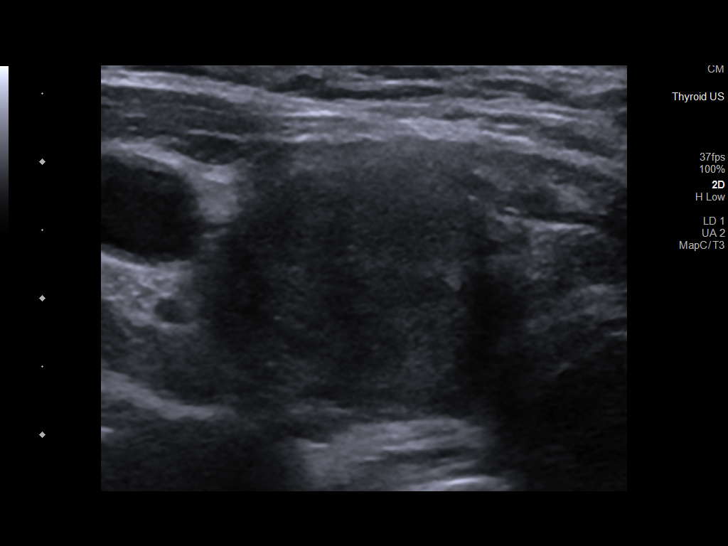
[im 3/19]
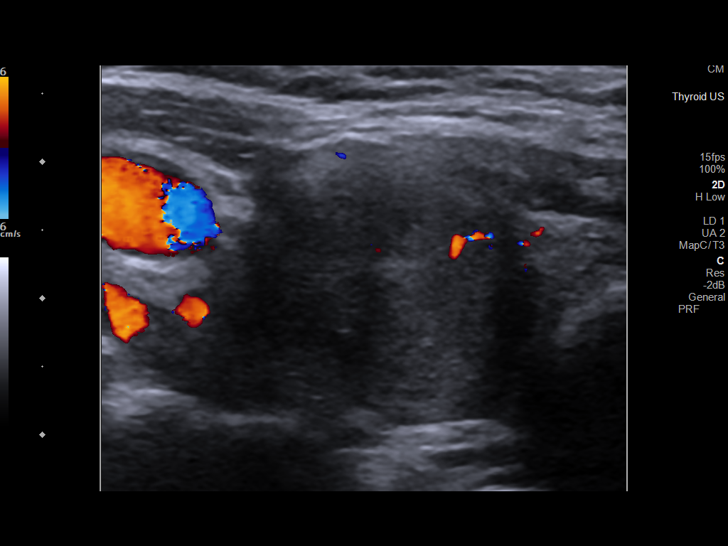
[im 4/19]
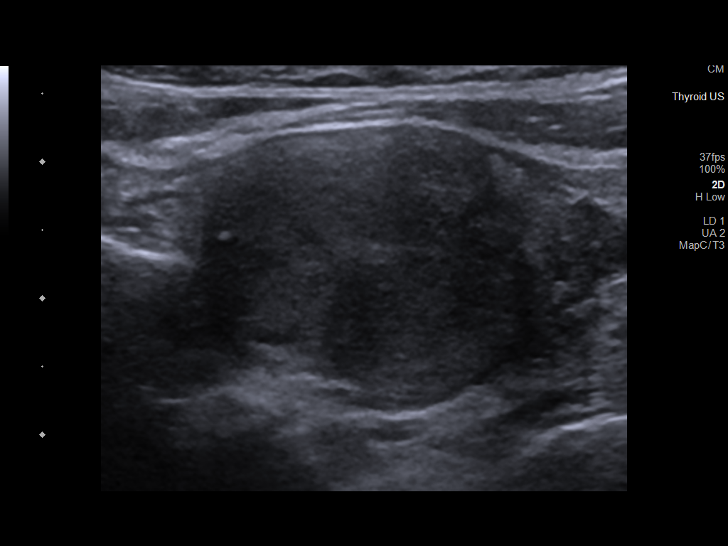
[im 6/19]
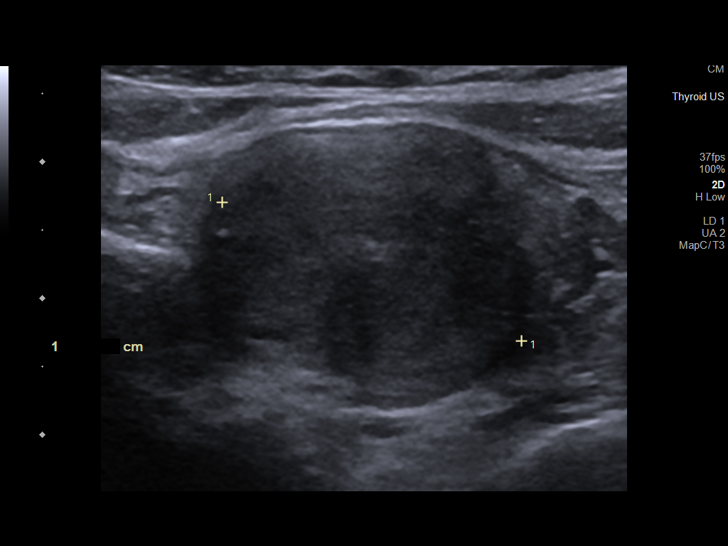
[im 7/19]
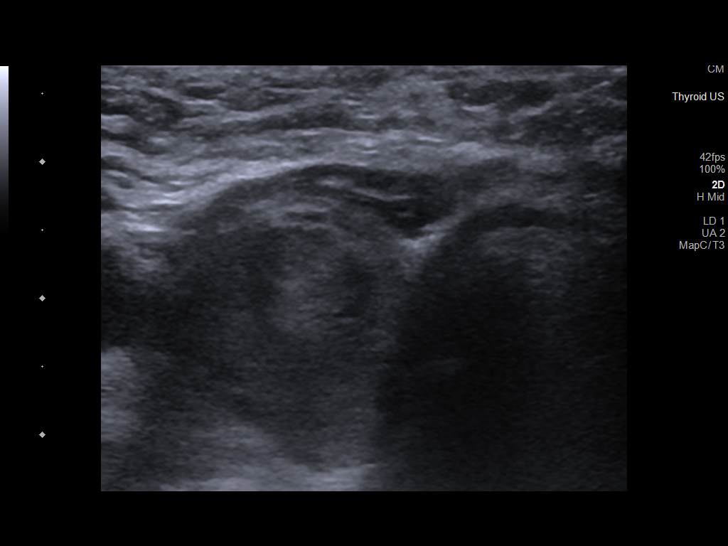
[im 9/19]
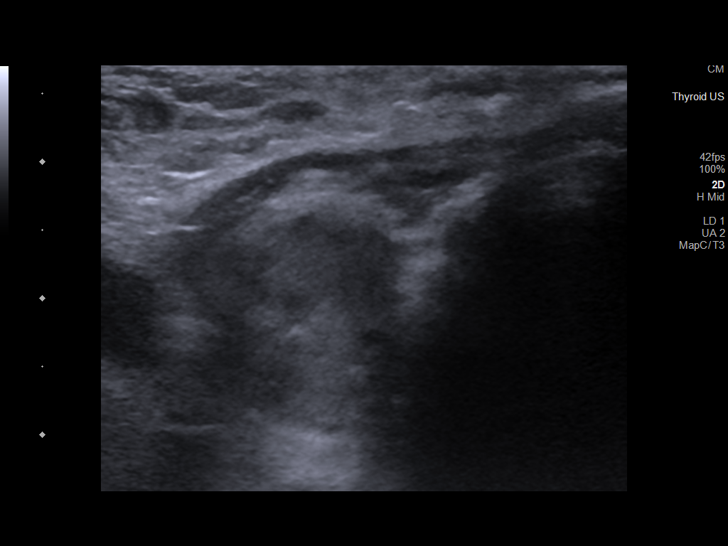
[im 10/19]
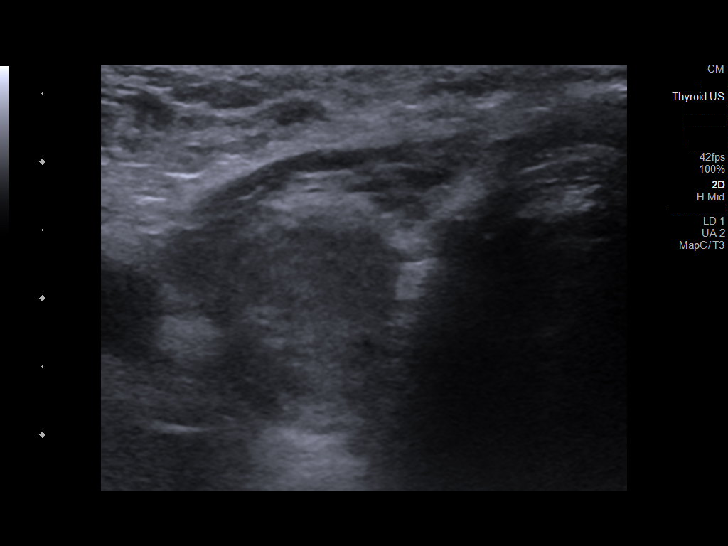
[im 11/19]
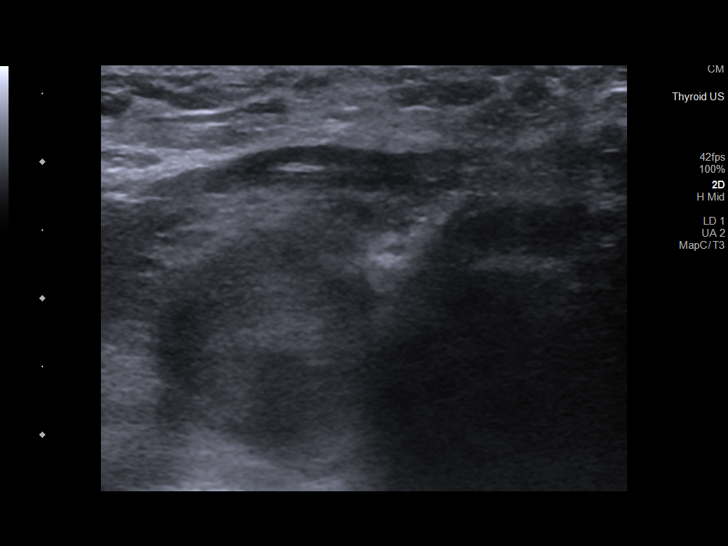
[im 13/19]
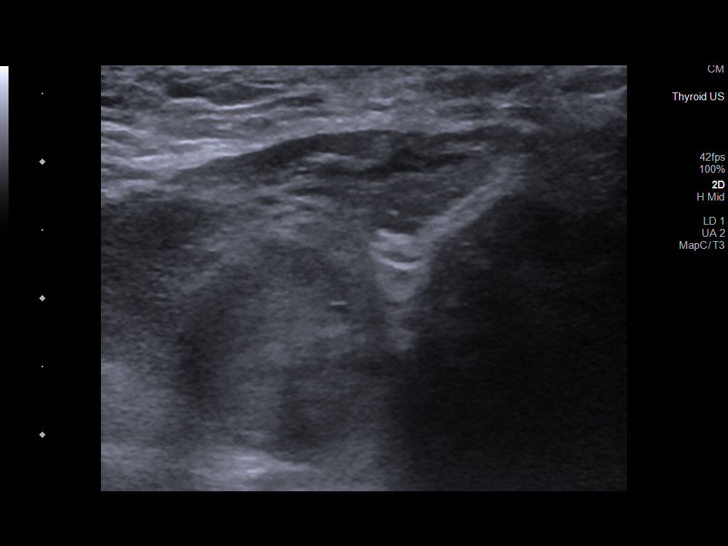
[im 14/19]
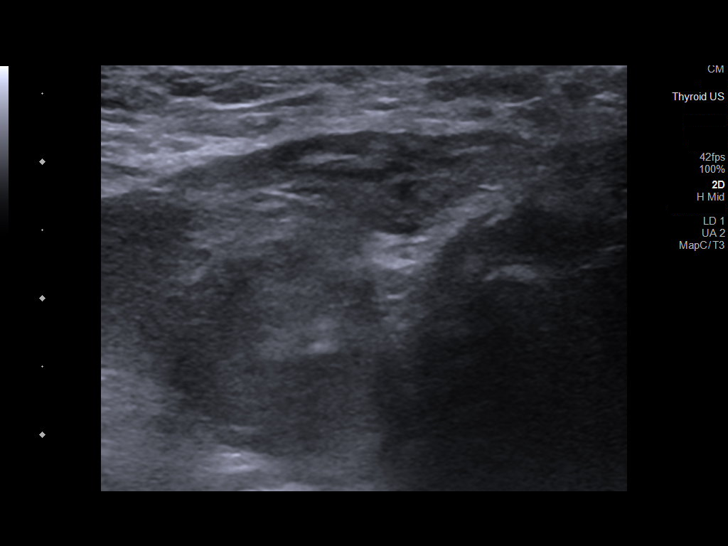
[im 16/19]
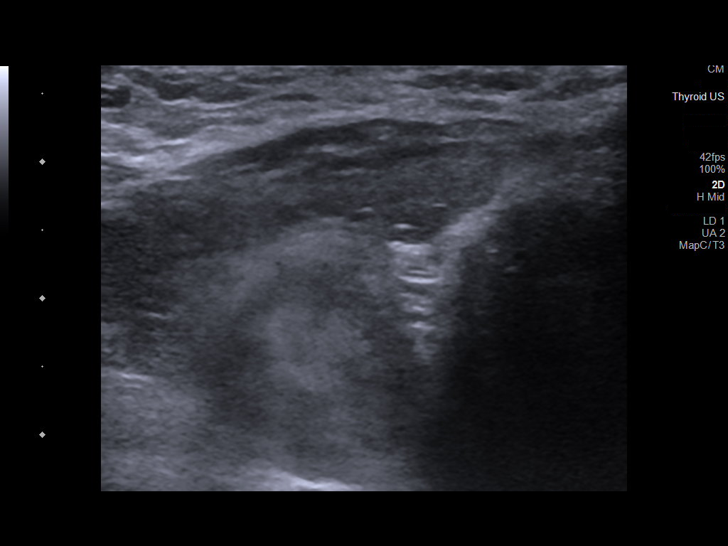
[im 17/19]
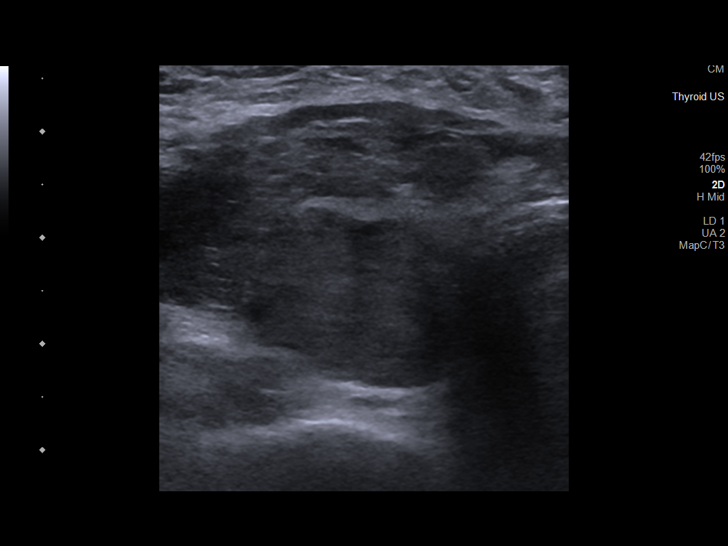
[im 19/19]
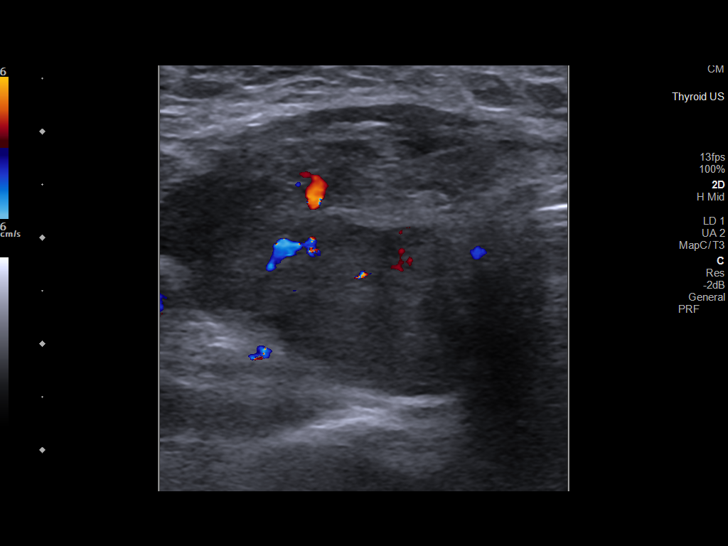

[13 of 19 positions shown; findings below may reference images not displayed]

Pre-procedural ultrasound scanning demonstrated unchanged size and
appearance of the indeterminate nodule within the right thyroid

The procedure was planned. The neck was prepped in the usual sterile
fashion, and a sterile drape was applied covering the operative
field. A timeout was performed prior to the initiation of the
procedure. Local anesthesia was provided with 1% lidocaine.

Under direct ultrasound guidance, 5 FNA biopsies were performed of
the right mid lobe thyroid nodule with a 25 gauge needle. Multiple
ultrasound images were saved for procedural documentation purposes.
The samples were prepared and submitted to pathology.

Limited post procedural scanning was negative for hematoma or
additional complication. Dressings were placed. The patient
tolerated the above procedures procedure well without immediate
postprocedural complication.
FINDINGS: Nodule reference number based on prior diagnostic ultrasound: 1

Maximum size: 2.1 cm

Location: Right; Mid

ACR TI-RADS risk category: TR4 (4-6 points)

Reason for biopsy: meets ACR TI-RADS criteria

Ultrasound imaging confirms appropriate placement of the needles
within the thyroid nodule.
IMPRESSION: Technically successful ultrasound guided fine needle aspiration of
right mid lobe thyroid nodule

Read by

Elsy Kristine

## 2022-10-13 NOTE — Progress Notes (Signed)
[facility] 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 - FNA of multinodular goiter in June 2022 showed positive for malignancy. - Total thyroidectomy with limited central compartment lymph node dissection by Dr. Gerrit Friends on 03/26/2021 - Pathology shows medullary thyroid cancer, 3.1 x 1.5 x 1.5 cm, tumor confined within the thyroid capsule, margins negative.  0/1 perithyroidal lymph node negative.  1/1 metastatic medullary carcinoma in the right paratracheal lymph node.  1/4 metastatic lymph node in the central compartment.  pT2, PN1a. - CT CAP with contrast on 04/09/2021 showed hypodense hepatic lesions, very small seen throughout the right hepatic lobe, some of which are low-density.  Heterogeneous enhancing lesion in the lower pole of the right kidney measuring 18 x 14 mm.  Second area in the medial right kidney measuring 18 x 18 mm.  8 mm solid-appearing lesion in the inferior right kidney lower pole.  Pulmonary hamartoma in the right lower lobe measuring 3 cm. - Labs on 04/03/2021 with calcitonin 136 and CEA 62.9. - NGS testing on 04/25/2021-BRAF negative, RET fusion not detected.  MSI-stable.  MMR-proficient.  NTRK 1/2/3 not detected.  TMB low.  PD-L1 is negative.  HRAS pathogenic variant exon 3. - 24-hour urine metanephrines and plasma metanephrines normal. - Bone scan from 04/22/2021 showed punctate area of increased uptake projected over right posterior 12th rib, can also represent renal activity. - Rib series on the right side did not show any acute or focal rib abnormality. - She was referred to Dr. Gerrit Friends for elevated  calcitonin levels. - Dr. Gerrit Friends referred her to Dr. Pollyann Kennedy who did not feel that she needs surgery. - She continues to have elevated calcitonin levels with normal CEA level.   2. Family/social history: - Lives at home with her husband.  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  She is non-smoker. - Her mother and sister had thyroid problems but no history of malignancies.    Plan: 1. PT 2 PN 1A medullary thyroid carcinoma: - Calcitonin on 08/20/2022 was 86, CEA was 4.2. - CT CAP on 09/08/2022: No evidence of metastatic thyroid cancer.  Right lower lobe pulmonary hematoma. - RTC 4 months with repeat CEA, calcitonin and routine labs.  2.  Right kidney lesion: - MRI from 10/03/2022: Contrast-enhancing at least 3 lesions in the right kidney suspicious for renal cell carcinoma. - We will make a referral to urology.     Orders Placed This Encounter  Procedures   CEA    Standing Status:   Future    Standing Expiration Date:   10/14/2023   Calcitonin    Standing Status:   Future    Standing Expiration Date:   10/14/2023   Comprehensive metabolic panel    Standing Status:   Future    Standing Expiration Date:   10/14/2023    Order Specific Question:   Release to patient    Answer:   Immediate   CBC    Standing Status:   Future    Standing Expiration Date:   10/14/2023      I,Katie Daubenspeck,acting as a scribe for Doreatha Massed, MD.,have documented  all relevant documentation on the behalf of Doreatha Massed, MD,as directed by  Doreatha Massed, MD while in the presence of Doreatha Massed, MD.   I, Doreatha Massed MD, have reviewed the above documentation for accuracy and completeness, and I agree with the above.   Doreatha Massed, MD   4/30/20244:51 PM  CHIEF COMPLAINT:   Diagnosis: medullary thyroid carcinoma    Cancer Staging  Medullary thyroid carcinoma [facility]) Staging form: Thyroid - Medullary, AJCC 8th Edition - Clinical  stage from 04/15/2021: Stage IVC (cT2, cN1a, cM1) - Unsigned    Prior Therapy: Total thyroidectomy with limited central compartment lymph node dissection by Dr. Gerrit Friends on 03/26/2021   Current Therapy:  surveillance   HISTORY OF PRESENT ILLNESS:   Oncology History  Medullary thyroid carcinoma (HCC)  03/17/2021 Initial Diagnosis   Medullary thyroid carcinoma (HCC)    Genetic Testing   Negative genetic testing. No pathogenic variants identified on the [facility] Multi-Cancer+RNA panel. VUS in SUFU called c.865C>T identified. The report date is 05/17/2021.  The Multi-Cancer Panel + RNA offered by [facility] includes sequencing and/or deletion duplication testing of the following 84 genes: AIP, ALK, APC, ATM, AXIN2,BAP1,  BARD1, BLM, BMPR1A, BRCA1, BRCA2, BRIP1, CASR, CDC73, CDH1, CDK4, CDKN1B, CDKN1C, CDKN2A (p14ARF), CDKN2A (p16INK4a), CEBPA, CHEK2, CTNNA1, DICER1, DIS3L2, EGFR (c.2369C>T, p.Thr790Met variant only), EPCAM (Deletion/duplication testing only), FH, FLCN, GATA2, GPC3, GREM1 (Promoter region deletion/duplication testing only), HOXB13 (c.251G>A, p.Gly84Glu), HRAS, KIT, MAX, MEN1, MET, MITF (c.952G>A, p.Glu318Lys variant only), MLH1, MSH2, MSH3, MSH6, MUTYH, NBN, NF1, NF2, NTHL1, PALB2, PDGFRA, PHOX2B, PMS2, POLD1, POLE, POT1, PRKAR1A, PTCH1, PTEN, RAD50, RAD51C, RAD51D, RB1, RECQL4, RET, RUNX1, SDHAF2, SDHA (sequence changes only), SDHB, SDHC, SDHD, SMAD4, SMARCA4, SMARCB1, SMARCE1, STK11, SUFU, TERC, TERT, TMEM127, TP53, TSC1, TSC2, VHL, WRN and WT1.      INTERVAL HISTORY:   Theresa Buchanan is a 77 y.o. female presenting to clinic today for follow up of medullary thyroid carcinoma. She was last seen by me on 07/23/22, with phone visit on 09/11/22.  Since her last visit, she underwent abdomen MRI on 10/03/22 showing: three kidney lesions consistent with renal cell carcinoma-- 2.3 cm complex mixed lesion of medial superior pole and sinus of right kidney, 1.9 cm predominantly solid mass of medial  inferior pole of right kidney, 1.9 cm partially exophytic mixed lesion inferiorly in medial inferior pole of right kidney; no evidence of renal vein invasion, lymphadenopathy, or metastatic disease.  Today, she states that she is doing well overall. Her appetite level is at 50%. Her energy level is at 10%.  PAST MEDICAL HISTORY:   Past Medical History: Past Medical History:  Diagnosis Date   Family history of cancer    Hypertension    Thyroid disease     Surgical History: Past Surgical History:  Procedure Laterality Date   ABDOMINAL HYSTERECTOMY     COLON SURGERY     THYROIDECTOMY N/A 03/26/2021   Procedure: TOTAL THYROIDECTOMY WITH LIMITED LYMPH NODE DISSECTION;  Surgeon: Darnell Level, MD;  Location: [facility];  Service: General;  Laterality: N/A;   TUBAL LIGATION      Social History: Social History   Socioeconomic History   Marital status: Married    Spouse name: Not on file   Number of children: Not on file   Years of education: Not on file   Highest education level: Not on file  Occupational History   Not on file  Tobacco Use   Smoking status: Never   Smokeless tobacco: Never  Vaping Use  Vaping Use: Never used  Substance and Sexual Activity   Alcohol use: Never   Drug use: Never   Sexual activity: Not on file  Other Topics Concern   Not on file  Social History Narrative   Not on file   Social Determinants of Health   Financial Resource Strain: Not on file  Food Insecurity: Not on file  Transportation Needs: Not on file  Physical Activity: Not on file  Stress: Not on file  Social Connections: Not on file  Intimate Partner Violence: Not on file    Family History: Family History  Problem Relation Age of Onset   Hypertension Mother    Thyroid disease Mother    Diabetes Mother    Cancer Maternal Aunt        unk types   Cancer Maternal Uncle        unk types    Current Medications:  Current Outpatient Medications:    aspirin EC 81 MG tablet,  Take 81 mg by mouth 2 (two) times a week., Disp: , Rfl:    calcium carbonate (TUMS) 500 MG chewable tablet, Chew 2 tablets (400 mg of elemental calcium total) by mouth 2 (two) times daily., Disp: 90 tablet, Rfl: 1   Camphor-Menthol-Methyl Sal (SALONPAS) 3.06-21-08 % PTCH, Place 1 application onto the skin daily as needed (pain)., Disp: , Rfl:    levothyroxine (SYNTHROID) 75 MCG tablet, Take 1 tablet (75 mcg total) by mouth daily before breakfast., Disp: 90 tablet, Rfl: 1   lisinopril-hydrochlorothiazide (ZESTORETIC) 20-25 MG tablet, Take 1 tablet by mouth daily., Disp: , Rfl:    Polyethyl Glycol-Propyl Glycol (SYSTANE OP), Place 1 drop into both eyes 2 (two) times daily as needed (dry eyes)., Disp: , Rfl:    traMADol (ULTRAM) 50 MG tablet, Take 50 mg by mouth every 6 (six) hours as needed., Disp: , Rfl:    trolamine salicylate (ASPERCREME) 10 % cream, Apply 1 application topically as needed for muscle pain., Disp: , Rfl:    Allergies: No Known Allergies  REVIEW OF SYSTEMS:   Review of Systems  Constitutional:  Negative for chills, fatigue and fever.  HENT:   Negative for lump/mass, mouth sores, nosebleeds, sore throat and trouble swallowing.   Eyes:  Negative for eye problems.  Respiratory:  Positive for shortness of breath. Negative for cough.   Cardiovascular:  Negative for chest pain, leg swelling and palpitations.  Gastrointestinal:  Positive for constipation. Negative for abdominal pain, diarrhea, nausea and vomiting.  Genitourinary:  Negative for bladder incontinence, difficulty urinating, dysuria, frequency, hematuria and nocturia.   Musculoskeletal:  Negative for arthralgias, back pain, flank pain, myalgias and neck pain.  Skin:  Negative for itching and rash.  Neurological:  Negative for dizziness, headaches and numbness.  Hematological:  Does not bruise/bleed easily.  Psychiatric/Behavioral:  Negative for depression, sleep disturbance and suicidal ideas. The patient is  nervous/anxious.   All other systems reviewed and are negative.    VITALS:   Blood pressure (!) 147/71, pulse 72, temperature 98.6 F (37 C), temperature source Oral, resp. rate 17, height 5\' 5"  (1.651 m), weight 158 lb 9.6 oz (71.9 kg), SpO2 98 %.  Wt Readings from Last 3 Encounters:  10/14/22 158 lb 9.6 oz (71.9 kg)  08/28/22 159 lb (72.1 kg)  07/23/22 160 lb 14.4 oz (73 kg)    Body mass index is 26.39 kg/m.  Performance status (ECOG): 1 - Symptomatic but completely ambulatory  PHYSICAL EXAM:   Physical Exam Vitals and nursing  note reviewed. Exam conducted with a chaperone present.  Constitutional:      Appearance: Normal appearance.  Cardiovascular:     Rate and Rhythm: Normal rate and regular rhythm.     Pulses: Normal pulses.     Heart sounds: Normal heart sounds.  Pulmonary:     Effort: Pulmonary effort is normal.     Breath sounds: Normal breath sounds.  Abdominal:     Palpations: Abdomen is soft. There is no hepatomegaly, splenomegaly or mass.     Tenderness: There is no abdominal tenderness.  Musculoskeletal:     Right lower leg: No edema.     Left lower leg: No edema.  Lymphadenopathy:     Cervical: No cervical adenopathy.     Right cervical: No superficial, deep or posterior cervical adenopathy.    Left cervical: No superficial, deep or posterior cervical adenopathy.     Upper Body:     Right upper body: No supraclavicular or axillary adenopathy.     Left upper body: No supraclavicular or axillary adenopathy.  Neurological:     General: No focal deficit present.     Mental Status: She is alert and oriented to person, place, and time.  Psychiatric:        Mood and Affect: Mood normal.        Behavior: Behavior normal.     LABS:      Latest Ref Rng & Units 06/27/2022    1:30 PM 03/27/2021    5:26 AM 03/21/2021    8:55 AM  CBC  WBC 4.0 - 10.5 K/uL 6.8  8.7  5.3   Hemoglobin 12.0 - 15.0 g/dL 16.1  09.6  04.5   Hematocrit 36.0 - 46.0 % 37.1  35.3   39.4   Platelets 150 - 400 K/uL 309  293  313       Latest Ref Rng & Units 06/27/2022    1:30 PM 05/02/2022    9:28 AM 09/16/2021    2:20 PM  CMP  Glucose 70 - 99 mg/dL 94  97    BUN 8 - 23 mg/dL 24  17    Creatinine 4.09 - 1.00 mg/dL 8.11  9.14  7.82   Sodium 135 - 145 mmol/L 138  141    Potassium 3.5 - 5.1 mmol/L 3.3  3.6    Chloride 98 - 111 mmol/L 101  98    CO2 22 - 32 mmol/L 26  27    Calcium 8.9 - 10.3 mg/dL 9.5  9.9    Total Protein 6.5 - 8.1 g/dL 7.8  7.5    Total Bilirubin 0.3 - 1.2 mg/dL 0.6  0.5    Alkaline Phos 38 - 126 U/L 61  62    AST 15 - 41 U/L 19  15    ALT 0 - 44 U/L 15  9       Lab Results  Component Value Date   CEA1 4.2 08/20/2022   /  CEA  Date Value Ref Range Status  08/20/2022 4.2 0.0 - 4.7 ng/mL Final    Comment:                                 Nonsmokers          <3.9  Smokers             <5.6 [employer] Electrochemiluminescence Immunoassay (ECLIA) Values obtained with different assay methods or kits cannot be used interchangeably.  Results cannot be interpreted as absolute evidence of the presence or absence of malignant disease.    No results found for: "PSA1" No results found for: "ZOX096" No results found for: "CAN125"  No results found for: "TOTALPROTELP", "ALBUMINELP", "A1GS", "A2GS", "BETS", "BETA2SER", "GAMS", "MSPIKE", "SPEI" No results found for: "TIBC", "FERRITIN", "IRONPCTSAT" No results found for: "LDH"   STUDIES:   MR Abdomen W Wo Contrast  Result Date: 10/06/2022 CLINICAL DATA:  Characterize suspicious right renal lesions, history of medullary thyroid cancer EXAM: MRI ABDOMEN WITHOUT AND WITH CONTRAST TECHNIQUE: Multiplanar multisequence MR imaging of the abdomen was performed both before and after the administration of intravenous contrast. CONTRAST:  7.5 mL Vueway gadolinium contrast IV COMPARISON:  CT chest abdomen pelvis, 09/09/2022, MR abdomen, 04/26/2021 FINDINGS: Lower chest: No  acute abnormality. Unchanged nodule of the right lower lobe, containing macroscopic fat and previously characterized as a benign hamartoma. Hepatobiliary: No solid liver abnormality is seen. No gallstones, gallbladder wall thickening, or biliary dilatation. Pancreas: Unremarkable. No pancreatic ductal dilatation or surrounding inflammatory changes. Spleen: Normal in size without significant abnormality. Adrenals/Urinary Tract: Adrenal glands are unremarkable. Heterogeneously enhancing complex mixed solid and cystic lesion of the medial superior pole and sinus of the right kidney measuring 2.3 x 1.6 cm (series 15, image 37). Additional, heterogeneously enhancing predominantly solid mass of the medial inferior pole of the right kidney measuring 1.9 x 1.5 cm (series 15, image 48), and just inferiorly to this lesion an additional heterogeneously enhancing partially exophytic mixed solid and cystic lesion measuring 1.9 x 1.5 cm (series 15, image 53). Additional simple, benign bilateral renal cortical cysts and subcentimeter lesions too small to characterize although most likely simple cysts. No hydronephrosis. Stomach/Bowel: Stomach is within normal limits. No evidence of bowel wall thickening, distention, or inflammatory changes. Vascular/Lymphatic: No significant vascular findings are present. No enlarged abdominal lymph nodes. Other: No abdominal wall hernia or abnormality. No ascites. Musculoskeletal: No acute or significant osseous findings. IMPRESSION: 1. Contrast enhancing complex mixed solid and cystic lesion of the medial superior pole and sinus of the right kidney measuring 2.3 x 1.6 cm. 2. Heterogeneously enhancing predominantly solid mass of the medial inferior pole of the right kidney measuring 1.9 x 1.5 cm. 3. Additional heterogeneously enhancing partially exophytic mixed solid and cystic lesion inferiorly in the medial inferior pole of the right kidney measuring 1.9 x 1.5 cm. 4. All three lesions above are  consistent with renal cell carcinoma (Bosniak category IV). 5. No evidence of renal vein invasion, lymphadenopathy or metastatic disease in the abdomen. 6. Unchanged pulmonary nodule of the right lower lobe, containing macroscopic fat and previously characterized as a benign hamartoma. These results will be called to the ordering clinician or representative by the Radiologist Assistant, and communication documented in the PACS or [employer]. Electronically Signed   By: Jearld Lesch M.D.   On: 10/06/2022 19:12

## 2022-10-14 ENCOUNTER — Inpatient Hospital Stay: Payer: Medicare HMO | Attending: Hematology | Admitting: Hematology

## 2022-10-14 VITALS — BP 147/71 | HR 72 | Temp 98.6°F | Resp 17 | Ht 65.0 in | Wt 158.6 lb

## 2022-10-14 DIAGNOSIS — Z90710 Acquired absence of both cervix and uterus: Secondary | ICD-10-CM | POA: Insufficient documentation

## 2022-10-14 DIAGNOSIS — N2889 Other specified disorders of kidney and ureter: Secondary | ICD-10-CM

## 2022-10-14 DIAGNOSIS — N289 Disorder of kidney and ureter, unspecified: Secondary | ICD-10-CM | POA: Diagnosis not present

## 2022-10-14 DIAGNOSIS — C73 Malignant neoplasm of thyroid gland: Secondary | ICD-10-CM | POA: Insufficient documentation

## 2022-10-14 DIAGNOSIS — E039 Hypothyroidism, unspecified: Secondary | ICD-10-CM | POA: Insufficient documentation

## 2022-10-14 DIAGNOSIS — Z809 Family history of malignant neoplasm, unspecified: Secondary | ICD-10-CM | POA: Insufficient documentation

## 2022-10-14 NOTE — Patient Instructions (Signed)
[facility]  Discharge Instructions  You were seen and examined today by Dr. Ellin Saba.  Dr. Ellin Saba discussed your most recent lab work and CT scan which revealed that you have some spots on your kidney that looks like kidney cancer.   Dr. Ellin Saba is going to refer you to urology to follow up on these spots.  Follow-up as scheduled in 4 months.    Thank you for choosing [facility] to provide your oncology and hematology care.   To afford each patient quality time with our provider, please arrive at least 15 minutes before your scheduled appointment time. You may need to reschedule your appointment if you arrive late (10 or more minutes). Arriving late affects you and other patients whose appointments are after yours.  Also, if you miss three or more appointments without notifying the office, you may be dismissed from the clinic at the provider's discretion.    Again, thank you for choosing [facility].  Our hope is that these requests will decrease the amount of time that you wait before being seen by our physicians.   If you have a lab appointment with the Cancer Center - please note that after April 8th, all labs will be drawn in the cancer center.  You do not have to check in or register with the main entrance as you have in the past but will complete your check-in at the cancer center.            _____________________________________________________________  Should you have questions after your visit to [facility], please contact our office at [phone] and follow the prompts.  Our office hours are 8:00 a.m. to 4:30 p.m. Monday - Thursday and 8:00 a.m. to 2:30 p.m. Friday.  Please note that voicemails left after 4:00 p.m. may not be returned until the following business day.  We are closed weekends and all major holidays.  You do have access to a nurse 24-7, just call the main number to the  clinic [phone] and do not press any options, hold on the line and a nurse will answer the phone.    For prescription refill requests, have your pharmacy contact our office and allow 72 hours.    Masks are no longer required in the cancer centers. If you would like for your care team to wear a mask while they are taking care of you, please let them know. You may have one support person who is at least 75 years old accompany you for your appointments.

## 2022-10-19 IMAGING — US US BREAST*L* LIMITED INC AXILLA
1 series · 13 of 14 positions shown · non-contrast
Comparison: Previous exam(s).

CLINICAL DATA: Follow-up intraductal papilloma biopsied under
ultrasound guidance in the 5 o'clock retroareolar left breast on
05/17/2019. She has not had the papilloma resected. The patient
reports no nipple discharge since that time.

EXAM:
DIGITAL DIAGNOSTIC BILATERAL MAMMOGRAM WITH TOMOSYNTHESIS AND CAD;
ULTRASOUND LEFT BREAST LIMITED
TECHNIQUE: Bilateral digital diagnostic mammography and breast tomosynthesis
was performed. The images were evaluated with computer-aided
detection.; Targeted ultrasound examination of the left breast was
performed

[Series 1: us breast*left* limited inc axilla · 0.07mm/px · 13 of 14 slices shown]
[im 1/14]
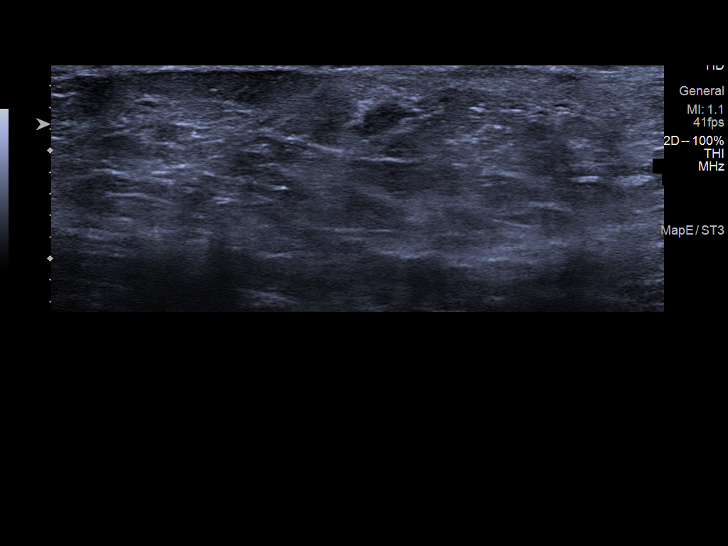
[im 2/14]
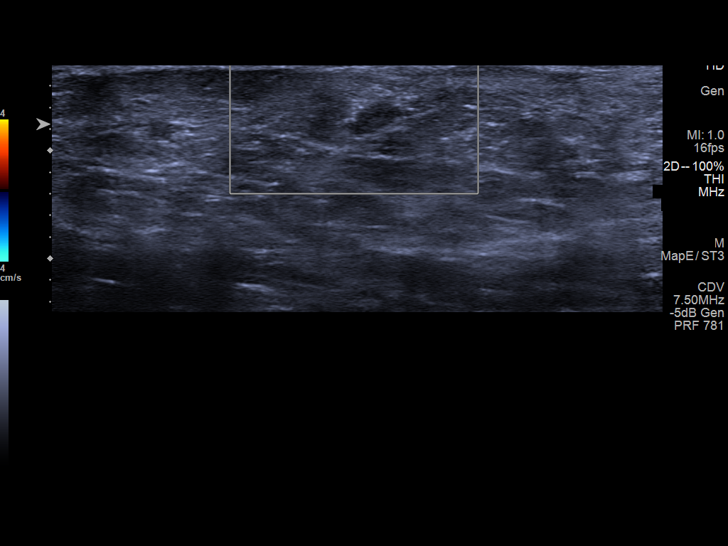
[im 3/14]
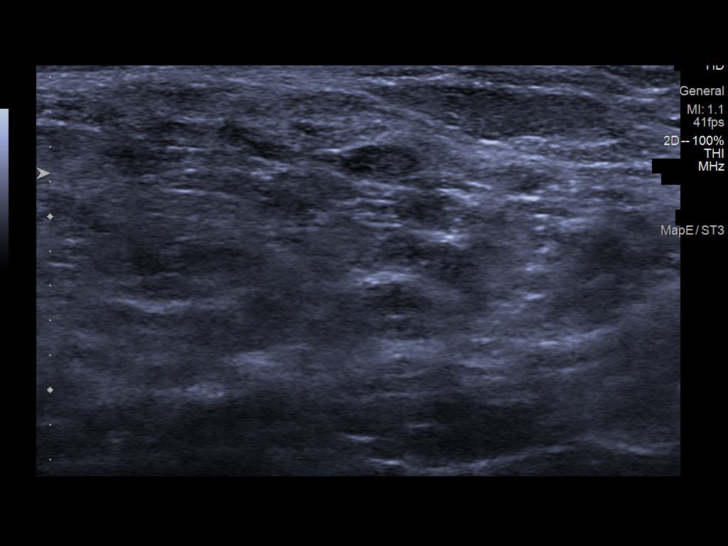
[im 4/14]
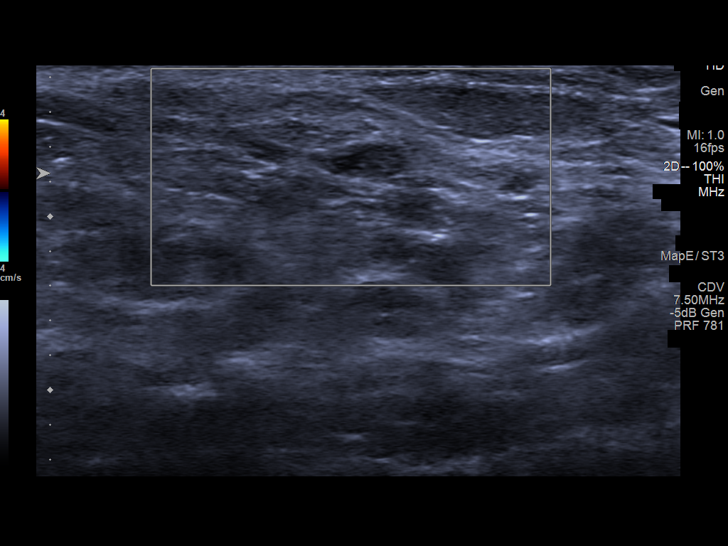
[im 5/14]
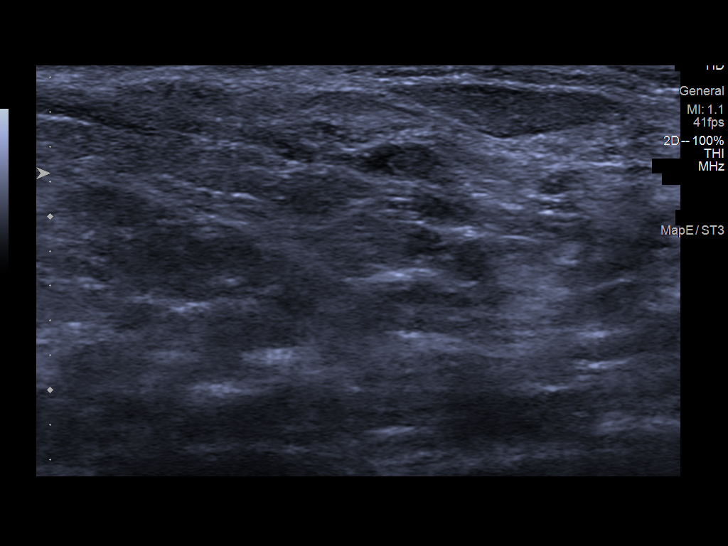
[im 6/14]
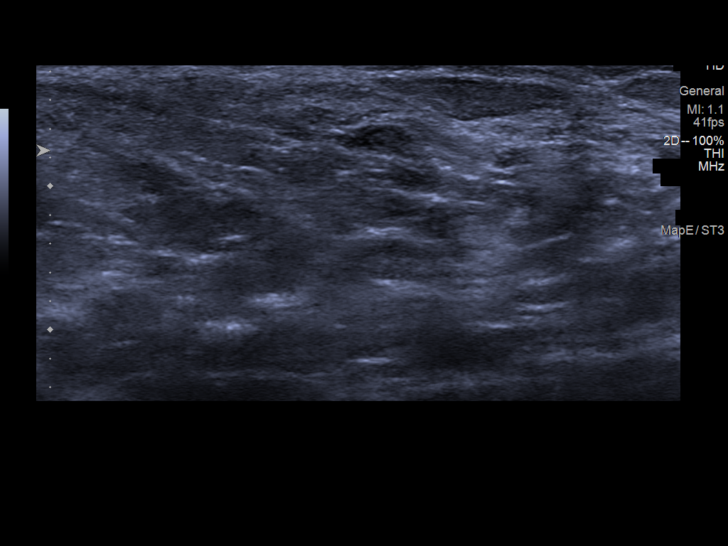
[im 8/14]
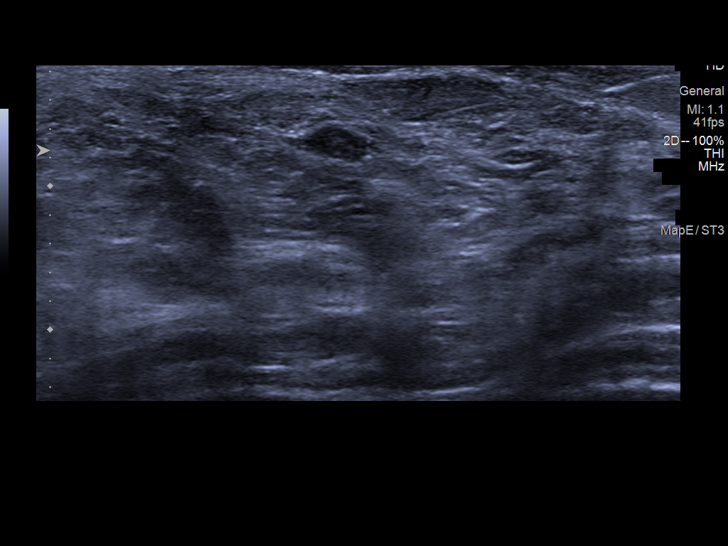
[im 9/14]
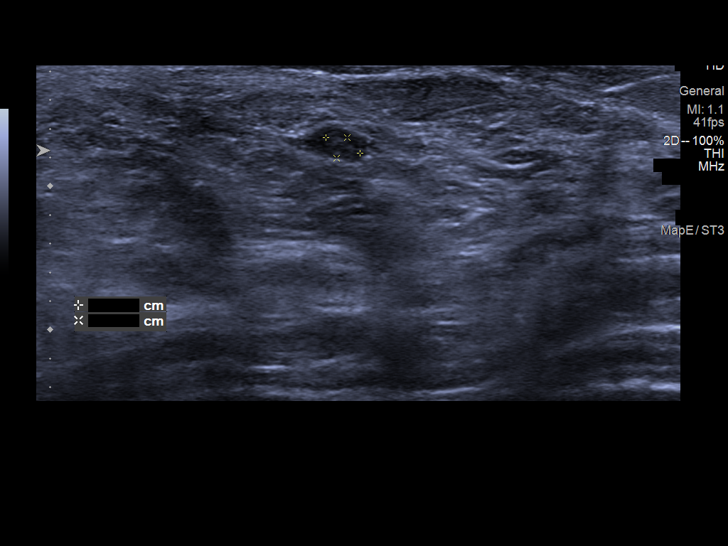
[im 10/14]
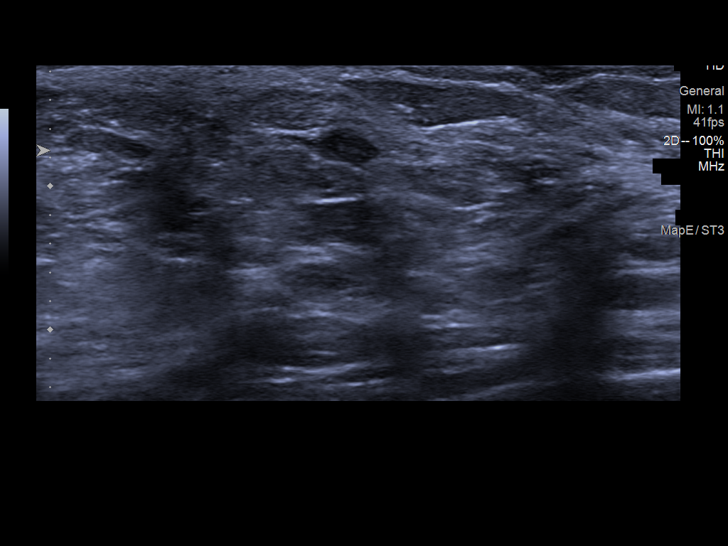
[im 11/14]
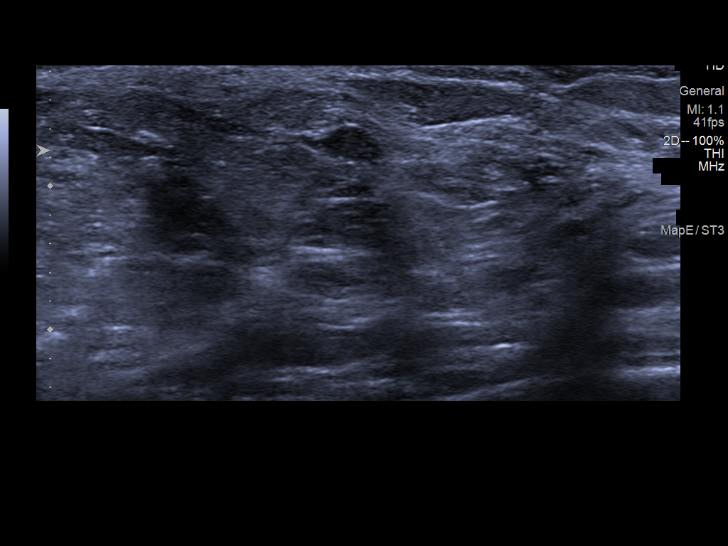
[im 12/14]
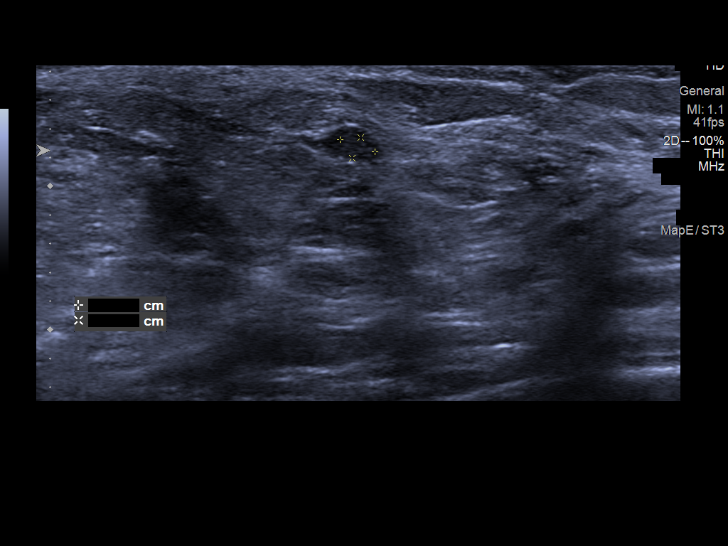
[im 13/14]
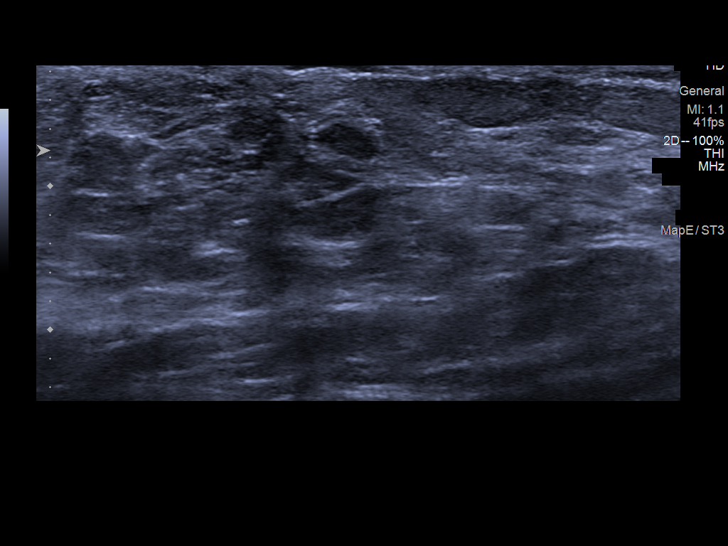
[im 14/14]
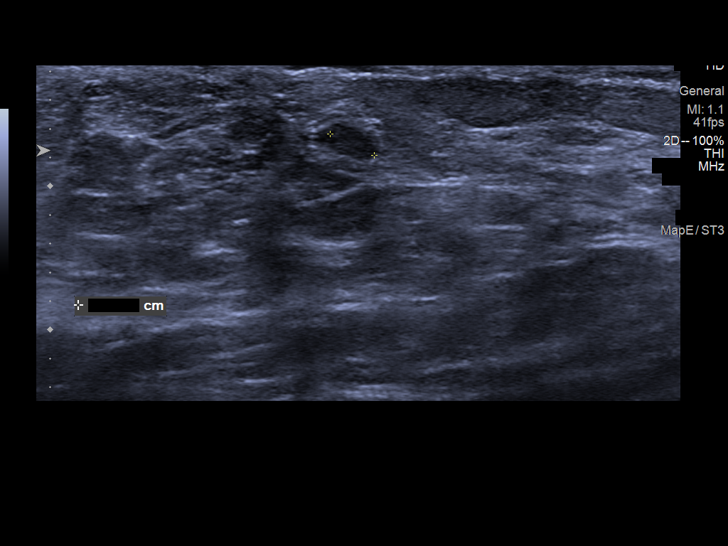

[13 of 14 positions shown; findings below may reference images not displayed]

ACR Breast Density Category c: The breast tissue is heterogeneously
dense, which may obscure small masses.
FINDINGS: Stable mammographic appearance of the breasts with no interval
findings suspicious for malignancy in either breast.

On physical exam, no mass palpable in the 5 o'clock retroareolar
left breast.

Targeted ultrasound is performed, showing a less prominent focally
dilated duct in the 5 o'clock retroareolar left breast. Within the
dilated duct, there is a poorly delineated oval, mildly hypoechoic
mass measures 3 x 3 x 2 mm. This measured 8 x 5 x 4 mm prior to
biopsy on 05/03/2019.
IMPRESSION: 1. Significant decrease in size of the previously biopsied
intraductal papilloma in the 5 o'clock retroareolar left breast
compared to the pre biopsy ultrasound.
2. No interval findings suspicious for malignancy in either breast.

RECOMMENDATION:
Bilateral diagnostic mammogram and left breast ultrasound in 1 year.
This will complete greater than 2 years of follow-up of previously
biopsied left breast intraductal papilloma.

I have discussed the findings and recommendations with the patient.
If applicable, a reminder letter will be sent to the patient
regarding the next appointment.

BI-RADS CATEGORY  3: Probably benign.

## 2022-11-07 ENCOUNTER — Encounter: Payer: Self-pay | Admitting: Urology

## 2022-11-07 ENCOUNTER — Ambulatory Visit (INDEPENDENT_AMBULATORY_CARE_PROVIDER_SITE_OTHER): Payer: Medicare HMO | Admitting: Urology

## 2022-11-07 VITALS — BP 164/79 | HR 67

## 2022-11-07 DIAGNOSIS — N289 Disorder of kidney and ureter, unspecified: Secondary | ICD-10-CM

## 2022-11-07 DIAGNOSIS — N2889 Other specified disorders of kidney and ureter: Secondary | ICD-10-CM

## 2022-11-07 LAB — URINALYSIS, ROUTINE W REFLEX MICROSCOPIC
Bilirubin, UA: NEGATIVE
Glucose, UA: NEGATIVE
Ketones, UA: NEGATIVE
Nitrite, UA: NEGATIVE
Protein,UA: NEGATIVE
RBC, UA: NEGATIVE
Specific Gravity, UA: 1.015 (ref 1.005–1.030)
Urobilinogen, Ur: 1 mg/dL (ref 0.2–1.0)
pH, UA: 6.5 (ref 5.0–7.5)

## 2022-11-07 LAB — MICROSCOPIC EXAMINATION
Epithelial Cells (non renal): >10 /hpf — AB (ref 0–10)
RBC, Urine: NONE SEEN /hpf (ref 0–2)

## 2022-11-07 NOTE — Patient Instructions (Signed)

## 2022-11-07 NOTE — Progress Notes (Signed)
11/07/2022 8:59 AM   TENIYA MATRAS 1946-04-30 [id number]  Referring provider: Doreatha Massed, MD [address] [place],  [place] [address]  Right renal mass    HPI: Ms Theresa Buchanan is a 77yo here for evaluation of right renal masses. She underwent CT as part of her hx of thuroid cancer and was found to have multiple right renal masses. MRI 10/03/2022 shows 3 2cm right renakl masses consistent with RCC. Creatinine 1.05. She denies any significant LUTS. NO hematuria   PMH: Past Medical History:  Diagnosis Date   Family history of cancer    Hypertension    Thyroid disease     Surgical History: Past Surgical History:  Procedure Laterality Date   ABDOMINAL HYSTERECTOMY     COLON SURGERY     THYROIDECTOMY N/A 03/26/2021   Procedure: TOTAL THYROIDECTOMY WITH LIMITED LYMPH NODE DISSECTION;  Surgeon: Darnell Level, MD;  Location: [facility];  Service: General;  Laterality: N/A;   TUBAL LIGATION      Home Medications:  Allergies as of 11/07/2022   No Known Allergies      Medication List        Accurate as of Nov 07, 2022  8:59 AM. If you have any questions, ask your nurse or doctor.          aspirin EC 81 MG tablet Take 81 mg by mouth 2 (two) times a week.   calcium carbonate 500 MG chewable tablet Commonly known as: Tums Chew 2 tablets (400 mg of elemental calcium total) by mouth 2 (two) times daily.   levothyroxine 75 MCG tablet Commonly known as: SYNTHROID Take 1 tablet (75 mcg total) by mouth daily before breakfast.   lisinopril-hydrochlorothiazide 20-25 MG tablet Commonly known as: ZESTORETIC Take 1 tablet by mouth daily.   Salonpas 3.06-21-08 % Ptch Generic drug: Camphor-Menthol-Methyl Sal Place 1 application onto the skin daily as needed (pain).   SYSTANE OP Place 1 drop into both eyes 2 (two) times daily as needed (dry eyes).   traMADol 50 MG tablet Commonly known as: ULTRAM Take 50 mg by mouth every 6 (six) hours as needed.   trolamine salicylate 10 %  cream Commonly known as: ASPERCREME Apply 1 application topically as needed for muscle pain.        Allergies: No Known Allergies  Family History: Family History  Problem Relation Age of Onset   Hypertension Mother    Thyroid disease Mother    Diabetes Mother    Cancer Maternal Aunt        unk types   Cancer Maternal Uncle        unk types    Social History:  reports that she has never smoked. She has never used smokeless tobacco. She reports that she does not drink alcohol and does not use drugs.  ROS: All other review of systems were reviewed and are negative except what is noted above in HPI  Physical Exam: BP (!) 164/79   Pulse 67   Constitutional:  Alert and oriented, No acute distress. HEENT: [place] AT, moist mucus membranes.  Trachea midline, no masses. Cardiovascular: No clubbing, cyanosis, or edema. Respiratory: Normal respiratory effort, no increased work of breathing. GI: Abdomen is soft, nontender, nondistended, no abdominal masses GU: No CVA tenderness.  Lymph: No cervical or inguinal lymphadenopathy. Skin: No rashes, bruises or suspicious lesions. Neurologic: Grossly intact, no focal deficits, moving all 4 extremities. Psychiatric: Normal mood and affect.  Laboratory Data: Lab Results  Component Value Date  WBC 6.8 06/27/2022   HGB 12.3 06/27/2022   HCT 37.1 06/27/2022   MCV 80.7 06/27/2022   PLT 309 06/27/2022    Lab Results  Component Value Date   CREATININE 1.05 (H) 06/27/2022    No results found for: "PSA"  No results found for: "TESTOSTERONE"  No results found for: "HGBA1C"  Urinalysis No results found for: "COLORURINE", "APPEARANCEUR", "LABSPEC", "PHURINE", "GLUCOSEU", "HGBUR", "BILIRUBINUR", "KETONESUR", "PROTEINUR", "UROBILINOGEN", "NITRITE", "LEUKOCYTESUR"  No results found for: "LABMICR", "WBCUA", "RBCUA", "LABEPIT", "MUCUS", "BACTERIA"  Pertinent Imaging: MRI 10/03/2022: Images reviewed and discussed with the patient  No  results found for this or any previous visit.  No results found for this or any previous visit.  No results found for this or any previous visit.  No results found for this or any previous visit.  No results found for this or any previous visit.  No valid procedures specified. No results found for this or any previous visit.  No results found for this or any previous visit.   Assessment & Plan:    1. Kidney lesion We discussed the natural hx of renal masses and the 80/20 malignant/benign likelihood. We disucssed the treatment options including active surveillance. Renal ablation, partial and radical nephrectomy. After discussing the options the patient elects for surveillance. I will see her back in 6 months with a repeat MRI - Urinalysis, Routine w reflex microscopic   No follow-ups on file.  Wilkie Aye, MD  [facility] [place]

## 2022-11-26 DIAGNOSIS — E890 Postprocedural hypothyroidism: Secondary | ICD-10-CM | POA: Diagnosis not present

## 2022-11-27 LAB — TSH: TSH: 36.4 u[IU]/mL — ABNORMAL HIGH (ref 0.450–4.500)

## 2022-11-27 LAB — T4, FREE: Free T4: 1.82 ng/dL — ABNORMAL HIGH (ref 0.82–1.77)

## 2022-12-03 ENCOUNTER — Encounter: Payer: Self-pay | Admitting: "Endocrinology

## 2022-12-03 ENCOUNTER — Ambulatory Visit: Payer: Medicare HMO | Admitting: "Endocrinology

## 2022-12-03 VITALS — BP 128/64 | HR 56 | Ht 65.0 in | Wt 157.6 lb

## 2022-12-03 DIAGNOSIS — C73 Malignant neoplasm of thyroid gland: Secondary | ICD-10-CM | POA: Diagnosis not present

## 2022-12-03 DIAGNOSIS — E890 Postprocedural hypothyroidism: Secondary | ICD-10-CM

## 2022-12-03 MED ORDER — LEVOTHYROXINE SODIUM 50 MCG PO TABS
50.0000 ug | ORAL_TABLET | Freq: Every day | ORAL | 1 refills | Status: DC
Start: 2022-12-03 — End: 2023-04-08

## 2022-12-03 NOTE — Progress Notes (Signed)
12/03/2022, 3:16 PM  Endocrinology follow-up note   Subjective:    Patient ID: Theresa Buchanan, female    DOB: 1945-07-10, PCP Avis Epley, PA-C   Past Medical History:  Diagnosis Date   Family history of cancer    Hypertension    Thyroid disease    Past Surgical History:  Procedure Laterality Date   ABDOMINAL HYSTERECTOMY     COLON SURGERY     THYROIDECTOMY N/A 03/26/2021   Procedure: TOTAL THYROIDECTOMY WITH LIMITED LYMPH NODE DISSECTION;  Surgeon: Darnell Level, MD;  Location: [facility];  Service: General;  Laterality: N/A;   TUBAL LIGATION     Social History   Socioeconomic History   Marital status: Married    Spouse name: Not on file   Number of children: Not on file   Years of education: Not on file   Highest education level: Not on file  Occupational History   Not on file  Tobacco Use   Smoking status: Never   Smokeless tobacco: Never  Vaping Use   Vaping Use: Never used  Substance and Sexual Activity   Alcohol use: Never   Drug use: Never   Sexual activity: Not on file  Other Topics Concern   Not on file  Social History Narrative   Not on file   Social Determinants of Health   Financial Resource Strain: Not on file  Food Insecurity: Not on file  Transportation Needs: Not on file  Physical Activity: Not on file  Stress: Not on file  Social Connections: Not on file   Family History  Problem Relation Age of Onset   Hypertension Mother    Thyroid disease Mother    Diabetes Mother    Cancer Maternal Aunt        unk types   Cancer Maternal Uncle        unk types   Outpatient Encounter Medications as of 12/03/2022  Medication Sig   levothyroxine (SYNTHROID) 50 MCG tablet Take 1 tablet (50 mcg total) by mouth daily.   aspirin EC 81 MG tablet Take 81 mg by mouth 2 (two) times a week.   calcium carbonate (TUMS) 500 MG chewable tablet Chew 2 tablets (400 mg of elemental calcium total) by mouth  2 (two) times daily.   Camphor-Menthol-Methyl Sal (SALONPAS) 3.06-21-08 % PTCH Place 1 application onto the skin daily as needed (pain).   lisinopril-hydrochlorothiazide (ZESTORETIC) 20-25 MG tablet Take 1 tablet by mouth daily.   Polyethyl Glycol-Propyl Glycol (SYSTANE OP) Place 1 drop into both eyes 2 (two) times daily as needed (dry eyes).   traMADol (ULTRAM) 50 MG tablet Take 50 mg by mouth every 6 (six) hours as needed.   trolamine salicylate (ASPERCREME) 10 % cream Apply 1 application topically as needed for muscle pain.   [DISCONTINUED] levothyroxine (SYNTHROID) 75 MCG tablet Take 1 tablet (75 mcg total) by mouth daily before breakfast.   No facility-administered encounter medications on file as of 12/03/2022.   ALLERGIES: No Known Allergies  VACCINATION STATUS:  There is no immunization history on file for this patient.  HPI Theresa Buchanan is 77 y.o. female who presents today with a medical history as above. she follows in this clinic for  RAI induced hypothyroidism.  Currently on levothyroxine  75 mcg p.o. daily before breakfast.  Her previsit labs are consistent with slight overtreatment.    His treatment was around 2006.  However, she was later found to have  multinodular goiter recently, in February 2022. Fine-needle aspiration in June 2022 showed thyroid malignancy. She was sent for total thyroidectomy which she underwent on March 26, 2021.  She has recovered from her surgery.  Her surgical sample showed medullary thyroid cancer measuring 3.1 cm, on the right lobe. Metastatic medullary thyroid carcinoma was found in 1 lymph node on the right paratracheal groove, and 1 out of 4 positive lymph node in the central compartment. She followed  with her oncologist Dr. Ellin Saba.  She has gone through staging imaging, genetic testing.  Her genetic testing did not show any pathological mutations-hereditary cause was not determined.  Calcitonin continues to be elevated, currently at 86.7,  has been as high as 136 in October 2022.  Her CEA is down to 4.2, has been as high as 62.9 in October 2022.  She was evaluated by ENT surgeon with repeat CT scan.  She was not found to have identifiable surgically amenable lesion.  She has left thoracic wall lesion which was thought to be cyst. She has not received any adjuvant chemotherapy agent yet. Her CT scan showed some lesions in the right hepatic lobe, small lesions in around the right kidney, pulmonary lesions said to be hematoma, 10 mm rounded lesion along the anterior chest wall below the thyroid.  -She is currently being evaluated by urology.  She denies palpitations, tremor, nor heat/cold intolerance. She denies family history of thyroid malignancy, however reports various forms of thyroid dysfunction in her family members including her mother and sisters.    Presurgical chest x-ray showed 3.3 cm mass which was later said to be hamartoma.    CT scan however showed questionable renal lesion, unspecified liver lesions which are not seen on the MRI.    Review of Systems  Constitutional: + Steady weight,  no fatigue, no subjective hyperthermia, no subjective hypothermia Eyes: no blurry vision, no xerophthalmia ENT: no sore throat, no nodules palpated in throat, no dysphagia/odynophagia, no hoarseness   Objective:       12/03/2022    9:35 AM 11/07/2022    8:50 AM 10/14/2022    3:20 PM  Vitals with BMI  Height 5\' 5"   5\' 5"   Weight 157 lbs 10 oz  158 lbs 10 oz  BMI 26.23  26.39  Systolic 128 164 161  Diastolic 64 79 71  Pulse 56 67 72    BP 128/64   Pulse (!) 56   Ht 5\' 5"  (1.651 m)   Wt 157 lb 9.6 oz (71.5 kg)   BMI 26.23 kg/m   Wt Readings from Last 3 Encounters:  12/03/22 157 lb 9.6 oz (71.5 kg)  10/14/22 158 lb 9.6 oz (71.9 kg)  08/28/22 159 lb (72.1 kg)    Physical Exam  Constitutional:  Body mass index is 26.23 kg/m.,  not in acute distress, normal state of mind Eyes: PERRLA, EOMI, no exophthalmos ENT:  moist mucous membranes, + post thyroidectomy scar, no gross cervical lymphadenopathy   Thyroid ultrasound August 06, 2020:  Right lobe 3.1 cm with 2.1 suspicious nodule. Left lobe 3.4 cm with 1.2 cm nonsuspicious nodule.  Fine-needle aspiration on December 12, 2020 of right-sided thyroid nodule Clinical History: 2.1 cm RML  Specimen Submitted:  A. THYROID, RIGHT, FINE NEEDLE ASPIRATION:  FINAL MICROSCOPIC DIAGNOSIS:  - Malignant cells present (Bethesda category VI)   SPECIMEN ADEQUACY:  Satisfactory for evaluation    Surgical sample from March 26, 2021 FINAL MICROSCOPIC DIAGNOSIS:   A. THYROID, TOTAL THYROIDECTOMY:  - Medullary thyroid carcinoma, 3.1 cm.  - Tumor confined within the thyroid capsule.  - Margins not involved.  - One perithyroidal lymph node negative for metastatic carcinoma (0/1).  - See oncology table.   B. LYMPH NODE, RIGHT PARATRACHEAL, EXCISION:  - Metastatic medullary carcinoma in one lymph node (1/1).   C. LYMPH NODE, CENTRAL COMPARTMENT, EXCISION:  - Metastatic medullary carcinoma in one of four lymph nodes (1/4).   April 26, 2021 MRI abdomen with and without contrast:  IMPRESSION: 1. Lower pole right renal lesion is favored to represent a cystic renal cell carcinoma. Bosniak IV (2019). An adjacent area of more central precontrast T1 hyperintensity may represent a separate hemorrhagic cyst or less likely be part of the exophytic lesion. 2. Central upper pole right renal lesion is favored to represent a complex renal sinus cyst but is indeterminate. Presuming the patient does not undergo complete nephrectomy, recommend follow-up pre and post contrast abdominal MRI at 6 months. 3. No suspicious liver lesion. 4. No evidence of abdominal metastatic disease. 5. Right lower lobe hamartoma. 6.  Aortic Atherosclerosis (ICD10-I70.0).    Assessment & Plan:   1.  Hypothyroidism following radioiodine therapy 2.  Medullary thyroid  carcinoma  For her postsurgical hypothyroidism, treated review thyroid function test are consistent with slight over-replacement.  I discussed and lowered her levothyroxine to 50 mcg p.o. daily before breakfast.     - We discussed about the correct intake of her thyroid hormone, on empty stomach at fasting, with water, separated by at least 30 minutes from breakfast and other medications,  and separated by more than 4 hours from calcium, iron, multivitamins, acid reflux medications (PPIs). -Patient is made aware of the fact that thyroid hormone replacement is needed for life, dose to be adjusted by periodic monitoring of thyroid function tests.  -I reviewed her interval imaging studies, genetic testing and lab work. - Her surgical cytology revealed locally invasive, possibly distant metastatic medullary thyroid cancer-involving at least 2 lymph nodes in the neck.  She is status post total thyroidectomy with limited neck dissection.  She was subsequently worked up for staging imaging as well as genetic testing.  Genetic test is not showing hereditary cause.  Her imaging studies are abnormal along with persistently elevated calcitonin currently at 86.7, also dropping from 109 in January 2024.  Her CEA is 4.2 remains stable.    Was previously seen by ENT surgeon Dr. Beverlee Nims. CT neck/thorax did not reveal an identifiable surgical lesion.  Left thoracic wall lesion was described as possible epidermal inclusion  cyst.   She has not undergone any additional surgery chemotherapy yet.  Imaging with CT and MRI showed more lesions in the kidneys, currently undergoing evaluation by urology.   I advised her to maintain close follow-up with all of her providers including oncology and urology.  - she is advised to maintain close follow up with Avis Epley, PA-C for primary care needs.   I spent  21  minutes in the care of the patient today including review of labs from Thyroid Function, CMP, and  other relevant labs ; imaging/biopsy records (current and previous including abstractions from other facilities); face-to-face time discussing  her lab results and symptoms, medications doses, her options of short and long term  treatment based on the latest standards of care / guidelines;   and documenting the encounter.  Kalman Shan Buchanan  participated in the discussions, expressed understanding, and voiced agreement with the above plans.  All questions were answered to her satisfaction. she is encouraged to contact clinic should she have any questions or concerns prior to her return visit.   Follow up plan: Return in about 4 months (around 04/04/2023) for F/U with Pre-visit Labs.   Marquis Lunch, MD [facility] [address] [place], [place] [address] Phone: [phone]  Fax: [phone]     12/03/2022, 3:16 PM  This note was partially dictated with voice recognition software. Similar sounding words can be transcribed inadequately or may not  be corrected upon review.

## 2023-01-19 DIAGNOSIS — F419 Anxiety disorder, unspecified: Secondary | ICD-10-CM | POA: Diagnosis not present

## 2023-01-19 DIAGNOSIS — K5900 Constipation, unspecified: Secondary | ICD-10-CM | POA: Diagnosis not present

## 2023-01-19 DIAGNOSIS — Z823 Family history of stroke: Secondary | ICD-10-CM | POA: Diagnosis not present

## 2023-01-19 DIAGNOSIS — I700 Atherosclerosis of aorta: Secondary | ICD-10-CM | POA: Diagnosis not present

## 2023-01-19 DIAGNOSIS — E890 Postprocedural hypothyroidism: Secondary | ICD-10-CM | POA: Diagnosis not present

## 2023-01-19 DIAGNOSIS — E785 Hyperlipidemia, unspecified: Secondary | ICD-10-CM | POA: Diagnosis not present

## 2023-01-19 DIAGNOSIS — Z008 Encounter for other general examination: Secondary | ICD-10-CM | POA: Diagnosis not present

## 2023-01-19 DIAGNOSIS — K760 Fatty (change of) liver, not elsewhere classified: Secondary | ICD-10-CM | POA: Diagnosis not present

## 2023-01-19 DIAGNOSIS — I739 Peripheral vascular disease, unspecified: Secondary | ICD-10-CM | POA: Diagnosis not present

## 2023-01-19 DIAGNOSIS — M1990 Unspecified osteoarthritis, unspecified site: Secondary | ICD-10-CM | POA: Diagnosis not present

## 2023-01-19 DIAGNOSIS — N1831 Chronic kidney disease, stage 3a: Secondary | ICD-10-CM | POA: Diagnosis not present

## 2023-01-19 DIAGNOSIS — I499 Cardiac arrhythmia, unspecified: Secondary | ICD-10-CM | POA: Diagnosis not present

## 2023-01-19 DIAGNOSIS — K219 Gastro-esophageal reflux disease without esophagitis: Secondary | ICD-10-CM | POA: Diagnosis not present

## 2023-02-12 ENCOUNTER — Inpatient Hospital Stay: Payer: Medicare HMO | Attending: Hematology

## 2023-02-12 DIAGNOSIS — R970 Elevated carcinoembryonic antigen [CEA]: Secondary | ICD-10-CM | POA: Diagnosis not present

## 2023-02-12 DIAGNOSIS — C73 Malignant neoplasm of thyroid gland: Secondary | ICD-10-CM | POA: Diagnosis not present

## 2023-02-12 DIAGNOSIS — N2889 Other specified disorders of kidney and ureter: Secondary | ICD-10-CM

## 2023-02-12 LAB — COMPREHENSIVE METABOLIC PANEL
ALT: 15 U/L (ref 0–44)
AST: 19 U/L (ref 15–41)
Albumin: 4 g/dL (ref 3.5–5.0)
Alkaline Phosphatase: 50 U/L (ref 38–126)
Anion gap: 10 (ref 5–15)
BUN: 19 mg/dL (ref 8–23)
CO2: 29 mmol/L (ref 22–32)
Calcium: 9.2 mg/dL (ref 8.9–10.3)
Chloride: 95 mmol/L — ABNORMAL LOW (ref 98–111)
Creatinine, Ser: 1.18 mg/dL — ABNORMAL HIGH (ref 0.44–1.00)
GFR, Estimated: 48 mL/min — ABNORMAL LOW (ref >60)
Glucose, Bld: 107 mg/dL — ABNORMAL HIGH (ref 70–99)
Potassium: 3.1 mmol/L — ABNORMAL LOW (ref 3.5–5.1)
Sodium: 134 mmol/L — ABNORMAL LOW (ref 135–145)
Total Bilirubin: 0.7 mg/dL (ref 0.3–1.2)
Total Protein: 7.5 g/dL (ref 6.5–8.1)

## 2023-02-12 LAB — CBC
HCT: 36.3 % (ref 36.0–46.0)
Hemoglobin: 12.1 g/dL (ref 12.0–15.0)
MCH: 26.8 pg (ref 26.0–34.0)
MCHC: 33.3 g/dL (ref 30.0–36.0)
MCV: 80.5 fL (ref 80.0–100.0)
Platelets: 312 10*3/uL (ref 150–400)
RBC: 4.51 MIL/uL (ref 3.87–5.11)
RDW: 15.1 % (ref 11.5–15.5)
WBC: 5.7 10*3/uL (ref 4.0–10.5)
nRBC: 0 % (ref 0.0–0.2)

## 2023-02-13 LAB — CEA: CEA: 4.6 ng/mL (ref 0.0–4.7)

## 2023-02-17 LAB — CALCITONIN: Calcitonin: 110 pg/mL — ABNORMAL HIGH (ref 0.0–5.0)

## 2023-02-19 ENCOUNTER — Inpatient Hospital Stay: Payer: Medicare HMO | Attending: Hematology | Admitting: Hematology

## 2023-02-19 VITALS — BP 157/81 | HR 67 | Temp 98.0°F | Resp 14 | Ht 65.0 in | Wt 158.9 lb

## 2023-02-19 DIAGNOSIS — Z9089 Acquired absence of other organs: Secondary | ICD-10-CM | POA: Insufficient documentation

## 2023-02-19 DIAGNOSIS — C73 Malignant neoplasm of thyroid gland: Secondary | ICD-10-CM | POA: Insufficient documentation

## 2023-02-19 DIAGNOSIS — N2889 Other specified disorders of kidney and ureter: Secondary | ICD-10-CM | POA: Diagnosis not present

## 2023-02-19 NOTE — Patient Instructions (Signed)
[facility]  Discharge Instructions  You were seen and examined today by Dr. Katragadda.  Dr. Katragadda discussed your most recent lab work and CT scan which revealed that everything looks good and stable.  Follow-up as scheduled in 6 months.    Thank you for choosing [facility] to provide your oncology and hematology care.   To afford each patient quality time with our provider, please arrive at least 15 minutes before your scheduled appointment time. You may need to reschedule your appointment if you arrive late (10 or more minutes). Arriving late affects you and other patients whose appointments are after yours.  Also, if you miss three or more appointments without notifying the office, you may be dismissed from the clinic at the provider's discretion.    Again, thank you for choosing [facility].  Our hope is that these requests will decrease the amount of time that you wait before being seen by our physicians.   If you have a lab appointment with the Cancer Center - please note that after April 8th, all labs will be drawn in the cancer center.  You do not have to check in or register with the main entrance as you have in the past but will complete your check-in at the cancer center.            _____________________________________________________________  Should you have questions after your visit to [facility], please contact our office at [phone] and follow the prompts.  Our office hours are 8:00 a.m. to 4:30 p.m. Monday - Thursday and 8:00 a.m. to 2:30 p.m. Friday.  Please note that voicemails left after 4:00 p.m. may not be returned until the following business day.  We are closed weekends and all major holidays.  You do have access to a nurse 24-7, just call the main number to the clinic [phone] and do not press any options, hold on the line and a nurse will answer the phone.    For  prescription refill requests, have your pharmacy contact our office and allow 72 hours.    Masks are no longer required in the cancer centers. If you would like for your care team to wear a mask while they are taking care of you, please let them know. You may have one support person who is at least 75 years old accompany you for your appointments.  

## 2023-02-19 NOTE — Progress Notes (Signed)
[facility] 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 - FNA of multinodular goiter in June 2022 showed positive for malignancy. - Total thyroidectomy with limited central compartment lymph node dissection by Dr. Gerrit Friends on 03/26/2021 - Pathology shows medullary thyroid cancer, 3.1 x 1.5 x 1.5 cm, tumor confined within the thyroid capsule, margins negative.  0/1 perithyroidal lymph node negative.  1/1 metastatic medullary carcinoma in the right paratracheal lymph node.  1/4 metastatic lymph node in the central compartment.  pT2, PN1a. - CT CAP with contrast on 04/09/2021 showed hypodense hepatic lesions, very small seen throughout the right hepatic lobe, some of which are low-density.  Heterogeneous enhancing lesion in the lower pole of the right kidney measuring 18 x 14 mm.  Second area in the medial right kidney measuring 18 x 18 mm.  8 mm solid-appearing lesion in the inferior right kidney lower pole.  Pulmonary hamartoma in the right lower lobe measuring 3 cm. - Labs on 04/03/2021 with calcitonin 136 and CEA 62.9. - NGS testing on 04/25/2021-BRAF negative, RET fusion not detected.  MSI-stable.  MMR-proficient.  NTRK 1/2/3 not detected.  TMB low.  PD-L1 is negative.  HRAS pathogenic variant exon 3. - 24-hour urine metanephrines and plasma metanephrines normal. - Bone scan from 04/22/2021 showed punctate area of increased uptake projected over right posterior 12th rib, can also represent renal activity. - Rib series on the right side did not show any acute or focal rib abnormality. - She was referred to Dr. Gerrit Friends for elevated calcitonin  levels. - Dr. Gerrit Friends referred her to Dr. Pollyann Kennedy who did not feel that she needs surgery. - She continues to have elevated calcitonin levels with normal CEA level.   2. Family/social history: - Lives at home with her husband.  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  She is non-smoker. - Her mother and sister had thyroid problems but no history of malignancies.    Plan: 1. PT 2 PN 1A medullary thyroid carcinoma: - She does not report any new onset pains.  No palpable lymphadenopathy in the neck region. - Reviewed labs from 02/12/2023: CEA is 4.6.  Calcitonin is stable at 110. - Recommend follow-up in 6 months with repeat CT CAP and tumor markers. - Continue to follow-up with Dr. Fransico Him.  2.  Right kidney lesion: - MRI on 10/03/2022 showed at least 3 lesions in the right kidney suspicious for RCC. - She was evaluated by Dr. Ronne Binning and watchful waiting recommended.  She has MRI coming up in November.    Orders Placed This Encounter  Procedures   CT CHEST ABDOMEN PELVIS W CONTRAST    Standing Status:   Future    Standing Expiration Date:   02/19/2024    Order Specific Question:   If indicated for the ordered procedure, I authorize the administration of contrast media per Radiology protocol    Answer:   Yes    Order Specific Question:   Does the patient have a contrast media/X-ray dye allergy?    Answer:   No    Order Specific Question:   Preferred imaging location?  Answer:   [facility]    Order Specific Question:   Release to patient    Answer:   Immediate    Order Specific Question:   If indicated for the ordered procedure, I authorize the administration of oral contrast media per Radiology protocol    Answer:   Yes   CT SOFT TISSUE NECK W CONTRAST    Standing Status:   Future    Standing Expiration Date:   02/19/2024    Order Specific Question:   If indicated for the ordered procedure, I authorize the administration of contrast media per Radiology  protocol    Answer:   Yes    Order Specific Question:   Does the patient have a contrast media/X-ray dye allergy?    Answer:   No    Order Specific Question:   Preferred imaging location?    Answer:   [facility]   CEA    Standing Status:   Future    Standing Expiration Date:   02/19/2024   Calcitonin    Standing Status:   Future    Standing Expiration Date:   02/19/2024   CBC with Differential/Platelet    Standing Status:   Future    Standing Expiration Date:   02/19/2024    Order Specific Question:   Release to patient    Answer:   Immediate   Comprehensive metabolic panel    Standing Status:   Future    Standing Expiration Date:   02/19/2024    Order Specific Question:   Release to patient    Answer:   Immediate      I,Helena R Teague,acting as a scribe for Doreatha Massed, MD.,have documented all relevant documentation on the behalf of Doreatha Massed, MD,as directed by  Doreatha Massed, MD while in the presence of Doreatha Massed, MD.  I, Doreatha Massed MD, have reviewed the above documentation for accuracy and completeness, and I agree with the above.    Doreatha Massed, MD   9/5/20246:44 PM  CHIEF COMPLAINT:   Diagnosis: medullary thyroid carcinoma    Cancer Staging  Medullary thyroid carcinoma [facility]) Staging form: Thyroid - Medullary, AJCC 8th Edition - Clinical stage from 04/15/2021: Stage IVC (cT2, cN1a, cM1) - Unsigned    Prior Therapy: Total thyroidectomy with limited central compartment lymph node dissection by Dr. Gerrit Friends on 03/26/2021   Current Therapy:  surveillance   HISTORY OF PRESENT ILLNESS:   Oncology History  Medullary thyroid carcinoma (HCC)  03/17/2021 Initial Diagnosis   Medullary thyroid carcinoma (HCC)    Genetic Testing   Negative genetic testing. No pathogenic variants identified on the [facility] Multi-Cancer+RNA panel. VUS in SUFU called c.865C>T identified. The report date is 05/17/2021.  The Multi-Cancer  Panel + RNA offered by [facility] includes sequencing and/or deletion duplication testing of the following 84 genes: AIP, ALK, APC, ATM, AXIN2,BAP1,  BARD1, BLM, BMPR1A, BRCA1, BRCA2, BRIP1, CASR, CDC73, CDH1, CDK4, CDKN1B, CDKN1C, CDKN2A (p14ARF), CDKN2A (p16INK4a), CEBPA, CHEK2, CTNNA1, DICER1, DIS3L2, EGFR (c.2369C>T, p.Thr790Met variant only), EPCAM (Deletion/duplication testing only), FH, FLCN, GATA2, GPC3, GREM1 (Promoter region deletion/duplication testing only), HOXB13 (c.251G>A, p.Gly84Glu), HRAS, KIT, MAX, MEN1, MET, MITF (c.952G>A, p.Glu318Lys variant only), MLH1, MSH2, MSH3, MSH6, MUTYH, NBN, NF1, NF2, NTHL1, PALB2, PDGFRA, PHOX2B, PMS2, POLD1, POLE, POT1, PRKAR1A, PTCH1, PTEN, RAD50, RAD51C, RAD51D, RB1, RECQL4, RET, RUNX1, SDHAF2, SDHA (sequence changes only), SDHB, SDHC, SDHD, SMAD4, SMARCA4, SMARCB1, SMARCE1, STK11, SUFU, TERC, TERT, TMEM127, TP53, TSC1, TSC2, VHL, WRN and WT1.  INTERVAL HISTORY:   Dreama is a 77 y.o. female presenting to clinic today for follow up of medullary thyroid carcinoma. She was last seen by me on 10/14/22.  Today, she states that she is doing well overall. Her appetite level is at 80%. Her energy level is at 60%.  She sees Dr. Fransico Him every 3 months. At her last visit with Dr. Fransico Him a month ago, her thyroid medication was changed. She saw the nephrologist recently, who scheduled an MRI in November 2024.   PAST MEDICAL HISTORY:   Past Medical History: Past Medical History:  Diagnosis Date   Family history of cancer    Hypertension    Thyroid disease     Surgical History: Past Surgical History:  Procedure Laterality Date   ABDOMINAL HYSTERECTOMY     COLON SURGERY     THYROIDECTOMY N/A 03/26/2021   Procedure: TOTAL THYROIDECTOMY WITH LIMITED LYMPH NODE DISSECTION;  Surgeon: Darnell Level, MD;  Location: [facility];  Service: General;  Laterality: N/A;   TUBAL LIGATION      Social History: Social History   Socioeconomic History   Marital status:  Married    Spouse name: Not on file   Number of children: Not on file   Years of education: Not on file   Highest education level: Not on file  Occupational History   Not on file  Tobacco Use   Smoking status: Never   Smokeless tobacco: Never  Vaping Use   Vaping status: Never Used  Substance and Sexual Activity   Alcohol use: Never   Drug use: Never   Sexual activity: Not on file  Other Topics Concern   Not on file  Social History Narrative   Not on file   Social Determinants of Health   Financial Resource Strain: Not on file  Food Insecurity: Not on file  Transportation Needs: Not on file  Physical Activity: Not on file  Stress: Not on file  Social Connections: Not on file  Intimate Partner Violence: Not on file    Family History: Family History  Problem Relation Age of Onset   Hypertension Mother    Thyroid disease Mother    Diabetes Mother    Cancer Maternal Aunt        unk types   Cancer Maternal Uncle        unk types    Current Medications:  Current Outpatient Medications:    aspirin EC 81 MG tablet, Take 81 mg by mouth 2 (two) times a week., Disp: , Rfl:    calcium carbonate (TUMS) 500 MG chewable tablet, Chew 2 tablets (400 mg of elemental calcium total) by mouth 2 (two) times daily., Disp: 90 tablet, Rfl: 1   Camphor-Menthol-Methyl Sal (SALONPAS) 3.06-21-08 % PTCH, Place 1 application onto the skin daily as needed (pain)., Disp: , Rfl:    levothyroxine (SYNTHROID) 50 MCG tablet, Take 1 tablet (50 mcg total) by mouth daily., Disp: 90 tablet, Rfl: 1   lisinopril-hydrochlorothiazide (ZESTORETIC) 20-25 MG tablet, Take 1 tablet by mouth daily., Disp: , Rfl:    Polyethyl Glycol-Propyl Glycol (SYSTANE OP), Place 1 drop into both eyes 2 (two) times daily as needed (dry eyes)., Disp: , Rfl:    traMADol (ULTRAM) 50 MG tablet, Take 50 mg by mouth every 6 (six) hours as needed., Disp: , Rfl:    trolamine salicylate (ASPERCREME) 10 % cream, Apply 1 application  topically as needed for muscle pain., Disp: , Rfl:    Allergies: No Known Allergies  REVIEW OF SYSTEMS:   Review of Systems  Constitutional:  Negative for chills, fatigue and fever.  HENT:   Negative for lump/mass, mouth sores, nosebleeds, sore throat and trouble swallowing.   Eyes:  Negative for eye problems.  Respiratory:  Positive for shortness of breath (with exertion). Negative for cough.   Cardiovascular:  Negative for chest pain, leg swelling and palpitations.  Gastrointestinal:  Negative for abdominal pain, constipation, diarrhea, nausea and vomiting.  Genitourinary:  Negative for bladder incontinence, difficulty urinating, dysuria, frequency, hematuria and nocturia.   Musculoskeletal:  Negative for arthralgias, back pain, flank pain, myalgias and neck pain.  Skin:  Negative for itching and rash.  Neurological:  Negative for dizziness, headaches and numbness.  Hematological:  Does not bruise/bleed easily.  Psychiatric/Behavioral:  Negative for depression, sleep disturbance and suicidal ideas. The patient is not nervous/anxious.   All other systems reviewed and are negative.    VITALS:   Blood pressure (!) 157/81, pulse 67, temperature 98 F (36.7 C), temperature source Oral, resp. rate 14, height 5\' 5"  (1.651 m), weight 158 lb 14.4 oz (72.1 kg), SpO2 100%.  Wt Readings from Last 3 Encounters:  02/19/23 158 lb 14.4 oz (72.1 kg)  12/03/22 157 lb 9.6 oz (71.5 kg)  10/14/22 158 lb 9.6 oz (71.9 kg)    Body mass index is 26.44 kg/m.  Performance status (ECOG): 1 - Symptomatic but completely ambulatory  PHYSICAL EXAM:   Physical Exam Vitals and nursing note reviewed. Exam conducted with a chaperone present.  Constitutional:      Appearance: Normal appearance.  Cardiovascular:     Rate and Rhythm: Normal rate and regular rhythm.     Pulses: Normal pulses.     Heart sounds: Normal heart sounds.  Pulmonary:     Effort: Pulmonary effort is normal.     Breath sounds:  Normal breath sounds.  Abdominal:     Palpations: Abdomen is soft. There is no hepatomegaly, splenomegaly or mass.     Tenderness: There is no abdominal tenderness.  Musculoskeletal:     Right lower leg: No edema.     Left lower leg: No edema.  Lymphadenopathy:     Cervical: No cervical adenopathy.     Right cervical: No superficial, deep or posterior cervical adenopathy.    Left cervical: No superficial, deep or posterior cervical adenopathy.     Upper Body:     Right upper body: No supraclavicular or axillary adenopathy.     Left upper body: No supraclavicular or axillary adenopathy.  Neurological:     General: No focal deficit present.     Mental Status: She is alert and oriented to person, place, and time.  Psychiatric:        Mood and Affect: Mood normal.        Behavior: Behavior normal.     LABS:      Latest Ref Rng & Units 02/12/2023    2:49 PM 06/27/2022    1:30 PM 03/27/2021    5:26 AM  CBC  WBC 4.0 - 10.5 K/uL 5.7  6.8  8.7   Hemoglobin 12.0 - 15.0 g/dL 56.2  13.0  86.5   Hematocrit 36.0 - 46.0 % 36.3  37.1  35.3   Platelets 150 - 400 K/uL 312  309  293       Latest Ref Rng & Units 02/12/2023    2:49 PM 06/27/2022    1:30 PM 05/02/2022    9:28 AM  CMP  Glucose 70 -  99 mg/dL 272  94  97   BUN 8 - 23 mg/dL 19  24  17    Creatinine 0.44 - 1.00 mg/dL 5.36  6.44  0.34   Sodium 135 - 145 mmol/L 134  138  141   Potassium 3.5 - 5.1 mmol/L 3.1  3.3  3.6   Chloride 98 - 111 mmol/L 95  101  98   CO2 22 - 32 mmol/L 29  26  27    Calcium 8.9 - 10.3 mg/dL 9.2  9.5  9.9   Total Protein 6.5 - 8.1 g/dL 7.5  7.8  7.5   Total Bilirubin 0.3 - 1.2 mg/dL 0.7  0.6  0.5   Alkaline Phos 38 - 126 U/L 50  61  62   AST 15 - 41 U/L 19  19  15    ALT 0 - 44 U/L 15  15  9       Lab Results  Component Value Date   CEA1 4.6 02/12/2023   /  CEA  Date Value Ref Range Status  02/12/2023 4.6 0.0 - 4.7 ng/mL Final    Comment:    (NOTE)                             Nonsmokers           <3.9                             Smokers             <5.6 [employer] Electrochemiluminescence Immunoassay (ECLIA) Values obtained with different assay methods or kits cannot be used interchangeably.  Results cannot be interpreted as absolute evidence of the presence or absence of malignant disease. Performed At: [facility] [address] [place], [place] [id number] Jolene Schimke MD VF:[id number]    No results found for: "PSA1" No results found for: "909 [id number] 2386" No results found for: "CAN125"  No results found for: "TOTALPROTELP", "ALBUMINELP", "A1GS", "A2GS", "BETS", "BETA2SER", "GAMS", "MSPIKE", "SPEI" No results found for: "TIBC", "FERRITIN", "IRONPCTSAT" No results found for: "LDH"   STUDIES:   No results found.

## 2023-02-26 ENCOUNTER — Other Ambulatory Visit (HOSPITAL_COMMUNITY): Payer: Self-pay | Admitting: Family Medicine

## 2023-02-26 DIAGNOSIS — Z1231 Encounter for screening mammogram for malignant neoplasm of breast: Secondary | ICD-10-CM

## 2023-03-30 ENCOUNTER — Encounter (HOSPITAL_COMMUNITY): Payer: Self-pay

## 2023-03-30 ENCOUNTER — Ambulatory Visit (HOSPITAL_COMMUNITY)
Admission: RE | Admit: 2023-03-30 | Discharge: 2023-03-30 | Disposition: A | Discharge status: Home / Self Care | Payer: Medicare HMO | Source: Ambulatory Visit | Attending: Family Medicine | Admitting: Family Medicine

## 2023-03-30 DIAGNOSIS — Z1231 Encounter for screening mammogram for malignant neoplasm of breast: Secondary | ICD-10-CM | POA: Insufficient documentation

## 2023-04-01 DIAGNOSIS — E890 Postprocedural hypothyroidism: Secondary | ICD-10-CM | POA: Diagnosis not present

## 2023-04-02 LAB — TSH: TSH: 74.2 u[IU]/mL — ABNORMAL HIGH (ref 0.450–4.500)

## 2023-04-02 LAB — T4, FREE: Free T4: 1.1 ng/dL (ref 0.82–1.77)

## 2023-04-08 ENCOUNTER — Encounter: Payer: Self-pay | Admitting: "Endocrinology

## 2023-04-08 ENCOUNTER — Ambulatory Visit: Payer: Medicare HMO | Admitting: "Endocrinology

## 2023-04-08 VITALS — BP 130/74 | HR 72 | Ht 65.0 in | Wt 162.8 lb

## 2023-04-08 DIAGNOSIS — C73 Malignant neoplasm of thyroid gland: Secondary | ICD-10-CM

## 2023-04-08 DIAGNOSIS — E890 Postprocedural hypothyroidism: Secondary | ICD-10-CM | POA: Diagnosis not present

## 2023-04-08 DIAGNOSIS — E876 Hypokalemia: Secondary | ICD-10-CM | POA: Insufficient documentation

## 2023-04-08 MED ORDER — LEVOTHYROXINE SODIUM 75 MCG PO TABS: 75.0000 ug | ORAL_TABLET | Freq: Every day | ORAL | 1 refills | Status: DC

## 2023-04-08 MED ORDER — POTASSIUM CHLORIDE CRYS ER 20 MEQ PO TBCR: 20.0000 meq | EXTENDED_RELEASE_TABLET | Freq: Two times a day (BID) | ORAL | 0 refills | Status: AC

## 2023-04-08 NOTE — Progress Notes (Signed)
04/08/2023, 12:23 PM  Endocrinology follow-up note   Subjective:    Patient ID: Theresa Buchanan, female    DOB: Nov 07, 1945, PCP Avis Epley, PA-C   Past Medical History:  Diagnosis Date   Family history of cancer    Hypertension    Thyroid disease    Past Surgical History:  Procedure Laterality Date   ABDOMINAL HYSTERECTOMY     BREAST BIOPSY Left 05/18/2019   Intraductal papilloma   COLON SURGERY     THYROIDECTOMY N/A 03/26/2021   Procedure: TOTAL THYROIDECTOMY WITH LIMITED LYMPH NODE DISSECTION;  Surgeon: Darnell Level, MD;  Location: [facility];  Service: General;  Laterality: N/A;   TUBAL LIGATION     Social History   Socioeconomic History   Marital status: Married    Spouse name: Not on file   Number of children: Not on file   Years of education: Not on file   Highest education level: Not on file  Occupational History   Not on file  Tobacco Use   Smoking status: Never   Smokeless tobacco: Never  Vaping Use   Vaping status: Never Used  Substance and Sexual Activity   Alcohol use: Never   Drug use: Never   Sexual activity: Not on file  Other Topics Concern   Not on file  Social History Narrative   Not on file   Social Determinants of Health   Financial Resource Strain: Not on file  Food Insecurity: Not on file  Transportation Needs: Not on file  Physical Activity: Not on file  Stress: Not on file  Social Connections: Not on file   Family History  Problem Relation Age of Onset   Hypertension Mother    Thyroid disease Mother    Diabetes Mother    Cancer Maternal Aunt        unk types   Cancer Maternal Uncle        unk types   Outpatient Encounter Medications as of 04/08/2023  Medication Sig   potassium chloride SA (KLOR-CON M) 20 MEQ tablet Take 1 tablet (20 mEq total) by mouth 2 (two) times daily.   aspirin EC 81 MG tablet Take 81 mg by mouth 2 (two) times a week.   calcium carbonate  (TUMS) 500 MG chewable tablet Chew 2 tablets (400 mg of elemental calcium total) by mouth 2 (two) times daily.   Camphor-Menthol-Methyl Sal (SALONPAS) 3.06-21-08 % PTCH Place 1 application onto the skin daily as needed (pain).   levothyroxine (SYNTHROID) 75 MCG tablet Take 1 tablet (75 mcg total) by mouth daily before breakfast.   lisinopril-hydrochlorothiazide (ZESTORETIC) 20-25 MG tablet Take 1 tablet by mouth daily.   Polyethyl Glycol-Propyl Glycol (SYSTANE OP) Place 1 drop into both eyes 2 (two) times daily as needed (dry eyes).   traMADol (ULTRAM) 50 MG tablet Take 50 mg by mouth every 6 (six) hours as needed.   trolamine salicylate (ASPERCREME) 10 % cream Apply 1 application topically as needed for muscle pain.   [DISCONTINUED] levothyroxine (SYNTHROID) 50 MCG tablet Take 1 tablet (50 mcg total) by mouth daily.   No facility-administered encounter medications on file as of 04/08/2023.   ALLERGIES: No Known Allergies  VACCINATION STATUS:  There is  no immunization history on file for this patient.  HPI Theresa Buchanan is 77 y.o. female who presents today with a medical history as above. she follows in this clinic for RAI induced hypothyroidism.  Currently on levothyroxine  50 mcg p.o. daily before breakfast.  Her previsit labs are consistent with under replacement.    -Her medical history is complicated.  She has medical history of hypothyroidism from radioactive iodine thyroid ablation.  She was later found to have  multinodular goiter recently, in February 2022. Fine-needle aspiration in June 2022 showed thyroid malignancy. She was sent for total thyroidectomy which she underwent on March 26, 2021.  She has recovered from her surgery.  Her surgical sample showed medullary thyroid cancer measuring 3.1 cm, on the right lobe. Metastatic medullary thyroid carcinoma was found in 1 lymph node on the right paratracheal groove, and 1 out of 4 positive lymph node in the central compartment. She  followed  with her oncologist Dr. Ellin Saba.  She has gone through staging imaging, genetic testing.  Her genetic testing did not show any pathological mutations-hereditary cause was not determined.  Calcitonin continues to be elevated, currently at 110, has been as high as 136 in October 2022.  Her CEA is down to 4.6, has been as high as 62.9 in October 2022.  She was evaluated by ENT surgeon with repeat CT scan.  She was not found to have identifiable surgically amenable lesion.  She has left thoracic wall lesion which was thought to be cyst. She has not received any adjuvant chemotherapy agent yet. Her CT scan showed some lesions in the right hepatic lobe, small lesions in around the right kidney, pulmonary lesions said to be hematoma, 10 mm rounded lesion along the anterior chest wall below the thyroid.  She denies palpitations, tremor, nor heat/cold intolerance. She denies family history of thyroid malignancy, however reports various forms of thyroid dysfunction in her family members including her mother and sisters.    Presurgical chest x-ray showed 3.3 cm mass which was later said to be hamartoma.    CT scan however showed questionable renal lesion, unspecified liver lesions which are not seen on the MRI.    Review of Systems  Constitutional: + Steady weight,  no fatigue, no subjective hyperthermia, no subjective hypothermia Eyes: no blurry vision, no xerophthalmia ENT: no sore throat, no nodules palpated in throat, no dysphagia/odynophagia, no hoarseness   Objective:       04/08/2023    9:49 AM 02/19/2023    2:53 PM 12/03/2022    9:35 AM  Vitals with BMI  Height 5\' 5"  5\' 5"  5\' 5"   Weight 162 lbs 13 oz 158 lbs 14 oz 157 lbs 10 oz  BMI 27.09 26.44 26.23  Systolic 130 157 518  Diastolic 74 81 64  Pulse 72 67 56    BP 130/74   Pulse 72   Ht 5\' 5"  (1.651 m)   Wt 162 lb 12.8 oz (73.8 kg)   BMI 27.09 kg/m   Wt Readings from Last 3 Encounters:  04/08/23 162 lb 12.8 oz (73.8  kg)  02/19/23 158 lb 14.4 oz (72.1 kg)  12/03/22 157 lb 9.6 oz (71.5 kg)    Physical Exam  Constitutional:  Body mass index is 27.09 kg/m.,  not in acute distress, normal state of mind Eyes: PERRLA, EOMI, no exophthalmos ENT: moist mucous membranes, + post thyroidectomy scar, no gross cervical lymphadenopathy   Thyroid ultrasound August 06, 2020:  Right lobe 3.1 cm  with 2.1 suspicious nodule. Left lobe 3.4 cm with 1.2 cm nonsuspicious nodule.  Fine-needle aspiration on December 12, 2020 of right-sided thyroid nodule Clinical History: 2.1 cm RML  Specimen Submitted:  A. THYROID, RIGHT, FINE NEEDLE ASPIRATION:   FINAL MICROSCOPIC DIAGNOSIS:  - Malignant cells present (Bethesda category VI)   SPECIMEN ADEQUACY:  Satisfactory for evaluation    Surgical sample from March 26, 2021 FINAL MICROSCOPIC DIAGNOSIS:   A. THYROID, TOTAL THYROIDECTOMY:  - Medullary thyroid carcinoma, 3.1 cm.  - Tumor confined within the thyroid capsule.  - Margins not involved.  - One perithyroidal lymph node negative for metastatic carcinoma (0/1).  - See oncology table.   B. LYMPH NODE, RIGHT PARATRACHEAL, EXCISION:  - Metastatic medullary carcinoma in one lymph node (1/1).   C. LYMPH NODE, CENTRAL COMPARTMENT, EXCISION:  - Metastatic medullary carcinoma in one of four lymph nodes (1/4).   April 26, 2021 MRI abdomen with and without contrast:  IMPRESSION: 1. Lower pole right renal lesion is favored to represent a cystic renal cell carcinoma. Bosniak IV (2019). An adjacent area of more central precontrast T1 hyperintensity may represent a separate hemorrhagic cyst or less likely be part of the exophytic lesion. 2. Central upper pole right renal lesion is favored to represent a complex renal sinus cyst but is indeterminate. Presuming the patient does not undergo complete nephrectomy, recommend follow-up pre and post contrast abdominal MRI at 6 months. 3. No suspicious liver lesion. 4. No  evidence of abdominal metastatic disease. 5. Right lower lobe hamartoma. 6.  Aortic Atherosclerosis (ICD10-I70.0).     Assessment & Plan:   1.  Hypothyroidism following radioiodine therapy 2.  Medullary thyroid carcinoma 3-hypokalemia  For her postsurgical hypothyroidism, her previsit thyroid function tests are consistent with under replacement.  I discussed and increased her levothyroxine to 75 mcg p.o. daily before breakfast.    - We discussed about the correct intake of her thyroid hormone, on empty stomach at fasting, with water, separated by at least 30 minutes from breakfast and other medications,  and separated by more than 4 hours from calcium, iron, multivitamins, acid reflux medications (PPIs). -Patient is made aware of the fact that thyroid hormone replacement is needed for life, dose to be adjusted by periodic monitoring of thyroid function tests.   -I reviewed her  imaging studies, genetic testing and lab work. - Her surgical cytology revealed locally invasive, possibly distant metastatic medullary thyroid cancer-involving at least 2 lymph nodes in the neck.  She is status post total thyroidectomy with limited neck dissection.  She was subsequently worked up for staging imaging as well as genetic testing.  Genetic test is not showing hereditary cause.  Her imaging studies are abnormal along with persistently elevated calcitonin currently at 110-elevated compared to 86.7 during last measurement.  Her CEA is 4.6 remains stable.    She was previously seen by ENT surgeon Dr. Beverlee Nims. CT neck/thorax did not reveal an identifiable surgical lesion.  Left thoracic wall lesion was described as possible epidermal inclusion  cyst.   She has not undergone any additional surgery chemotherapy yet.  Imaging with CT and MRI showed more lesions in the kidneys, currently undergoing evaluation by urology.   I advised her to maintain close follow-up with all of her providers including oncology  and urology.  Hypokalemia: Her potassium is low at 3.1.  She would benefit from potassium supplement.  I discussed initiated potassium 20 mEq p.o. twice daily.   - she is advised to  maintain close follow up with Avis Epley, PA-C for primary care needs.  I spent  25  minutes in the care of the patient today including review of labs from Thyroid Function, CMP, and other relevant labs ; imaging/biopsy records (current and previous including abstractions from other facilities); face-to-face time discussing  her lab results and symptoms, medications doses, her options of short and long term treatment based on the latest standards of care / guidelines;   and documenting the encounter.  Kalman Shan Mckoy  participated in the discussions, expressed understanding, and voiced agreement with the above plans.  All questions were answered to her satisfaction. she is encouraged to contact clinic should she have any questions or concerns prior to her return visit.    Follow up plan: Return in about 4 months (around 08/09/2023) for F/U with Pre-visit Labs.   Marquis Lunch, MD [facility] [address] [place], [place] [address] Phone: [phone]  Fax: [phone]     04/08/2023, 12:23 PM  This note was partially dictated with voice recognition software. Similar sounding words can be transcribed inadequately or may not  be corrected upon review.

## 2023-04-14 DIAGNOSIS — E039 Hypothyroidism, unspecified: Secondary | ICD-10-CM | POA: Diagnosis not present

## 2023-04-14 DIAGNOSIS — Z0001 Encounter for general adult medical examination with abnormal findings: Secondary | ICD-10-CM | POA: Diagnosis not present

## 2023-05-08 ENCOUNTER — Ambulatory Visit: Payer: Medicare HMO | Admitting: Urology

## 2023-06-26 ENCOUNTER — Ambulatory Visit (HOSPITAL_COMMUNITY): Payer: Medicare HMO

## 2023-07-09 ENCOUNTER — Ambulatory Visit (HOSPITAL_COMMUNITY)
Admission: RE | Admit: 2023-07-09 | Discharge: 2023-07-09 | Disposition: A | Discharge status: Home / Self Care | Payer: Medicare HMO | Source: Ambulatory Visit | Attending: Urology | Admitting: Urology

## 2023-07-09 DIAGNOSIS — N2889 Other specified disorders of kidney and ureter: Secondary | ICD-10-CM | POA: Diagnosis not present

## 2023-07-09 DIAGNOSIS — K6389 Other specified diseases of intestine: Secondary | ICD-10-CM | POA: Diagnosis not present

## 2023-07-09 MED ORDER — GADOBUTROL 1 MMOL/ML IV SOLN
7.0000 mL | Freq: Once | INTRAVENOUS | Status: AC | PRN
  Administered 2023-07-09: 7 mL via INTRAVENOUS

## 2023-07-10 ENCOUNTER — Ambulatory Visit (INDEPENDENT_AMBULATORY_CARE_PROVIDER_SITE_OTHER): Payer: Medicare HMO | Admitting: Urology

## 2023-07-10 VITALS — BP 160/78 | HR 74

## 2023-07-10 DIAGNOSIS — N289 Disorder of kidney and ureter, unspecified: Secondary | ICD-10-CM | POA: Diagnosis not present

## 2023-07-10 NOTE — Progress Notes (Signed)
07/10/2023 11:33 AM   Theresa Buchanan [birth year] [id number]  Referring provider: Avis Epley, PA-C [address] [place],  [place] [address]  Kidney mass   HPI: Theresa Buchanan is a 77yo here for followup for a right renal mass. MRI abd shows stable cystic renal mass. She denies any flank pain. No significant LUTS. No other complaints today   PMH: Past Medical History:  Diagnosis Date   Family history of cancer    Hypertension    Thyroid disease     Surgical History: Past Surgical History:  Procedure Laterality Date   ABDOMINAL HYSTERECTOMY     BREAST BIOPSY Left 05/18/2019   Intraductal papilloma   COLON SURGERY     THYROIDECTOMY N/A 03/26/2021   Procedure: TOTAL THYROIDECTOMY WITH LIMITED LYMPH NODE DISSECTION;  Surgeon: Darnell Level, MD;  Location: [facility];  Service: General;  Laterality: N/A;   TUBAL LIGATION      Home Medications:  Allergies as of 07/10/2023   No Known Allergies      Medication List        Accurate as of July 10, 2023 11:33 AM. If you have any questions, ask your nurse or doctor.          aspirin EC 81 MG tablet Take 81 mg by mouth 2 (two) times a week.   calcium carbonate 500 MG chewable tablet Commonly known as: Tums Chew 2 tablets (400 mg of elemental calcium total) by mouth 2 (two) times daily.   levothyroxine 75 MCG tablet Commonly known as: SYNTHROID Take 1 tablet (75 mcg total) by mouth daily before breakfast.   lisinopril-hydrochlorothiazide 20-25 MG tablet Commonly known as: ZESTORETIC Take 1 tablet by mouth daily.   potassium chloride SA 20 MEQ tablet Commonly known as: KLOR-CON M Take 1 tablet (20 mEq total) by mouth 2 (two) times daily.   Salonpas 3.06-21-08 % Ptch Generic drug: Camphor-Menthol-Methyl Sal Place 1 application onto the skin daily as needed (pain).   SYSTANE OP Place 1 drop into both eyes 2 (two) times daily as needed (dry eyes).   traMADol 50 MG tablet Commonly known as: ULTRAM Take  50 mg by mouth every 6 (six) hours as needed.   trolamine salicylate 10 % cream Commonly known as: ASPERCREME Apply 1 application topically as needed for muscle pain.        Allergies: No Known Allergies  Family History: Family History  Problem Relation Age of Onset   Hypertension Mother    Thyroid disease Mother    Diabetes Mother    Cancer Maternal Aunt        unk types   Cancer Maternal Uncle        unk types    Social History:  reports that she has never smoked. She has never used smokeless tobacco. She reports that she does not drink alcohol and does not use drugs.  ROS: All other review of systems were reviewed and are negative except what is noted above in HPI  Physical Exam: BP (!) 160/78   Pulse 74   Constitutional:  Alert and oriented, No acute distress. HEENT: [place] AT, moist mucus membranes.  Trachea midline, no masses. Cardiovascular: No clubbing, cyanosis, or edema. Respiratory: Normal respiratory effort, no increased work of breathing. GI: Abdomen is soft, nontender, nondistended, no abdominal masses GU: No CVA tenderness.  Lymph: No cervical or inguinal lymphadenopathy. Skin: No rashes, bruises or suspicious lesions. Neurologic: Grossly intact, no focal deficits, moving all 4 extremities. Psychiatric: Normal mood  and affect.  Laboratory Data: Lab Results  Component Value Date   WBC 5.7 02/12/2023   HGB 12.1 02/12/2023   HCT 36.3 02/12/2023   MCV 80.5 02/12/2023   PLT 312 02/12/2023    Lab Results  Component Value Date   CREATININE 1.18 (H) 02/12/2023    No results found for: "PSA"  No results found for: "TESTOSTERONE"  No results found for: "HGBA1C"  Urinalysis    Component Value Date/Time   APPEARANCEUR Clear 11/07/2022 0853   GLUCOSEU Negative 11/07/2022 0853   BILIRUBINUR Negative 11/07/2022 0853   PROTEINUR Negative 11/07/2022 0853   NITRITE Negative 11/07/2022 0853   LEUKOCYTESUR Trace (A) 11/07/2022 0853    Lab Results   Component Value Date   LABMICR See below: 11/07/2022   WBCUA 0-5 11/07/2022   LABEPIT >10 (A) 11/07/2022   BACTERIA Few (A) 11/07/2022    Pertinent Imaging: MRI 07/09/2023: Images reviewed and discussed with the patient  No results found for this or any previous visit.  No results found for this or any previous visit.  No results found for this or any previous visit.  No results found for this or any previous visit.  No results found for this or any previous visit.  No results found for this or any previous visit.  No results found for this or any previous visit.  No results found for this or any previous visit.   Assessment & Plan:    1. Kidney lesion (Primary) Followup 6 months with renal US - Urinalysis, Routine w reflex microscopic   No follow-ups on file.  Wilkie Aye, MD  [facility] [place]

## 2023-07-17 ENCOUNTER — Encounter: Payer: Self-pay | Admitting: Urology

## 2023-07-17 NOTE — Patient Instructions (Signed)
 A Growth in the Kidney (Renal Mass): What to Know  A renal mass is an abnormal growth in the kidney. It may be found during an MRI, CT scan, or ultrasound that's done to check for other problems in the belly. Some renal masses are cancerous, or malignant, and can grow or spread quickly. Others are benign, which means they're not cancer. Renal masses include: Tumors. These may be either cancerous or benign. The most common cancerous tumor in adults is called renal cell carcinoma. In children, the most common type is Wilms tumor. The most common kidney tumors that aren't cancer include renal adenomas, oncocytomas, and angiomyolipomas (AML). Cysts. These are pockets of fluid that form on or in the kidney. What are the causes? Certain types of cancers, infections, or injuries can cause a renal mass. It's not always known what causes a cyst to form in or on the kidney. What are the signs or symptoms? Often, a renal mass or kidney cyst doesn't cause any signs or symptoms. How is this diagnosed? Your health care provider may suggest tests to diagnose the cause of your renal mass. These tests may include: A physical exam. Blood tests. Pee (urine) tests. Imaging tests, such as: CT scan. MRI. Ultrasound. Chest X-ray or bone scan. These may be done if a tumor is cancerous to see if the cancer has spread outside the kidney. Biopsy. This is when a small piece of tissue is removed from the renal mass for testing. How is this treated? Treatment is not always needed for a renal mass. Treatment will depend on the cause of the mass and if it's causing any problems or symptoms. For a cancerous renal mass, treatment options may include: Surgery. This is done to remove the tumor and any affected tissue. Chemotherapy. This uses medicines to kill cancer cells. Radiation. High-energy X-rays or gamma rays are used to kill cancer cells. Ablation. This uses extreme hot or cold temperature to kill the cancer  cells. Immunotherapy. Medicines are used to help the body's defense system (immune system) fight the cancer cells. Taking part in clinical trials. This involves trying new or experimental treatments to see if they're effective. Most kidney cysts don't need to be treated. Follow these instructions at home: What you need to do at home will depend on the cause of the mass. Follow the instructions that your provider gives you. In general: Take medicines only as told. If you were given antibiotics, take them as told. Do not stop taking them even if you start to feel better. Follow any instructions from your provider about what you can and can't do. Do not smoke, vape, or use nicotine or tobacco. Keep all follow-up visits. Your provider will need to check if your renal mass has changed or grown. Contact a health care provider if: You have flank pain, which is pain in your side or back. You have a fever. You have a loss of appetite. You have pain or swelling in your belly. You lose weight. Get help right away if: Your pain gets worse. There's blood in your pee. You can't pee. This information is not intended to replace advice given to you by your health care provider. Make sure you discuss any questions you have with your health care provider. Document Revised: 11/28/2022 Document Reviewed: 11/28/2022 Elsevier Patient Education  2024 [employer].

## 2023-08-05 DIAGNOSIS — E876 Hypokalemia: Secondary | ICD-10-CM | POA: Diagnosis not present

## 2023-08-05 DIAGNOSIS — E890 Postprocedural hypothyroidism: Secondary | ICD-10-CM | POA: Diagnosis not present

## 2023-08-06 LAB — COMPREHENSIVE METABOLIC PANEL
ALT: 10 [IU]/L (ref 0–32)
AST: 13 [IU]/L (ref 0–40)
Albumin: 4.3 g/dL (ref 3.8–4.8)
Alkaline Phosphatase: 63 [IU]/L (ref 44–121)
BUN/Creatinine Ratio: 15 (ref 12–28)
BUN: 17 mg/dL (ref 8–27)
Bilirubin Total: 0.4 mg/dL (ref 0.0–1.2)
CO2: 26 mmol/L (ref 20–29)
Calcium: 10.2 mg/dL (ref 8.7–10.3)
Chloride: 101 mmol/L (ref 96–106)
Creatinine, Ser: 1.16 mg/dL — ABNORMAL HIGH (ref 0.57–1.00)
Globulin, Total: 3.1 g/dL (ref 1.5–4.5)
Glucose: 94 mg/dL (ref 70–99)
Potassium: 3.6 mmol/L (ref 3.5–5.2)
Sodium: 143 mmol/L (ref 134–144)
Total Protein: 7.4 g/dL (ref 6.0–8.5)
eGFR: 49 mL/min/{1.73_m2} — ABNORMAL LOW (ref >59)

## 2023-08-06 LAB — T4, FREE: Free T4: 1.86 ng/dL — ABNORMAL HIGH (ref 0.82–1.77)

## 2023-08-06 LAB — TSH: TSH: 30.6 u[IU]/mL — ABNORMAL HIGH (ref 0.450–4.500)

## 2023-08-12 ENCOUNTER — Encounter: Payer: Self-pay | Admitting: "Endocrinology

## 2023-08-12 ENCOUNTER — Ambulatory Visit: Payer: Medicare HMO | Admitting: "Endocrinology

## 2023-08-12 VITALS — BP 122/74 | HR 84 | Ht 65.0 in | Wt 159.4 lb

## 2023-08-12 DIAGNOSIS — C73 Malignant neoplasm of thyroid gland: Secondary | ICD-10-CM | POA: Diagnosis not present

## 2023-08-12 DIAGNOSIS — E876 Hypokalemia: Secondary | ICD-10-CM | POA: Diagnosis not present

## 2023-08-12 DIAGNOSIS — E890 Postprocedural hypothyroidism: Secondary | ICD-10-CM | POA: Diagnosis not present

## 2023-08-12 MED ORDER — LEVOTHYROXINE SODIUM 125 MCG PO TABS: 62.5000 ug | ORAL_TABLET | Freq: Every day | ORAL | 1 refills | Status: DC

## 2023-08-12 NOTE — Progress Notes (Signed)
 08/12/2023, 3:21 PM  Endocrinology follow-up note   Subjective:    Patient ID: Theresa Buchanan, female    DOB: 10/20/1945, PCP Avis Epley, PA-C   Past Medical History:  Diagnosis Date   Family history of cancer    Hypertension    Thyroid disease    Past Surgical History:  Procedure Laterality Date   ABDOMINAL HYSTERECTOMY     BREAST BIOPSY Left 05/18/2019   Intraductal papilloma   COLON SURGERY     THYROIDECTOMY N/A 03/26/2021   Procedure: TOTAL THYROIDECTOMY WITH LIMITED LYMPH NODE DISSECTION;  Surgeon: Darnell Level, MD;  Location: [facility];  Service: General;  Laterality: N/A;   TUBAL LIGATION     Social History   Socioeconomic History   Marital status: Married    Spouse name: Not on file   Number of children: Not on file   Years of education: Not on file   Highest education level: Not on file  Occupational History   Not on file  Tobacco Use   Smoking status: Never   Smokeless tobacco: Never  Vaping Use   Vaping status: Never Used  Substance and Sexual Activity   Alcohol use: Never   Drug use: Never   Sexual activity: Not on file  Other Topics Concern   Not on file  Social History Narrative   Not on file   Social Drivers of Health   Financial Resource Strain: Not on file  Food Insecurity: Not on file  Transportation Needs: Not on file  Physical Activity: Not on file  Stress: Not on file  Social Connections: Not on file   Family History  Problem Relation Age of Onset   Hypertension Mother    Thyroid disease Mother    Diabetes Mother    Cancer Maternal Aunt        unk types   Cancer Maternal Uncle        unk types   Outpatient Encounter Medications as of 08/12/2023  Medication Sig   levothyroxine (SYNTHROID) 125 MCG tablet Take 0.5 tablets (62.5 mcg total) by mouth daily before breakfast.   aspirin EC 81 MG tablet Take 81 mg by mouth 2 (two) times a week.   calcium carbonate (TUMS) 500  MG chewable tablet Chew 2 tablets (400 mg of elemental calcium total) by mouth 2 (two) times daily.   Camphor-Menthol-Methyl Sal (SALONPAS) 3.06-21-08 % PTCH Place 1 application onto the skin daily as needed (pain).   lisinopril-hydrochlorothiazide (ZESTORETIC) 20-25 MG tablet Take 1 tablet by mouth daily.   Polyethyl Glycol-Propyl Glycol (SYSTANE OP) Place 1 drop into both eyes 2 (two) times daily as needed (dry eyes).   potassium chloride SA (KLOR-CON M) 20 MEQ tablet Take 1 tablet (20 mEq total) by mouth 2 (two) times daily.   traMADol (ULTRAM) 50 MG tablet Take 50 mg by mouth every 6 (six) hours as needed.   trolamine salicylate (ASPERCREME) 10 % cream Apply 1 application topically as needed for muscle pain.   [DISCONTINUED] levothyroxine (SYNTHROID) 75 MCG tablet Take 1 tablet (75 mcg total) by mouth daily before breakfast.   No facility-administered encounter medications on file as of 08/12/2023.   ALLERGIES: No Known Allergies  VACCINATION STATUS:  There is no immunization history on file for this patient.  HPI Theresa Buchanan is 78 y.o. female who presents today with a medical history as above. she follows in this clinic for RAI induced hypothyroidism.  Currently on levothyroxine 75 mcg p.o. daily before breakfast.  Her previsit labs are consistent with slight over replacement.   -Her medical history is complicated.  She has medical history of hypothyroidism from radioactive iodine thyroid ablation.  She was later found to have  multinodular goiter recently, in February 2022. Fine-needle aspiration in June 2022 showed thyroid malignancy. She was sent for total thyroidectomy which she underwent on March 26, 2021.  She has recovered from her surgery.  Her surgical sample showed medullary thyroid cancer measuring 3.1 cm, on the right lobe. Metastatic medullary thyroid carcinoma was found in 1 lymph node on the right paratracheal groove, and 1 out of 4 positive lymph node in the central  compartment. She followed  with her oncologist Dr. Ellin Saba.  She has gone through staging imaging, genetic testing.  Her genetic testing did not show any pathological mutations-hereditary cause was not determined.  Calcitonin continues to be elevated, currently at 110, has been as high as 136 in October 2022.  Her CEA is down to 4.6, has been as high as 62.9 in October 2022.  She was evaluated by ENT surgeon with repeat CT scan.  She was not found to have identifiable surgically amenable lesion.  She has left thoracic wall lesion which was thought to be cyst. She has not received any adjuvant chemotherapy agent yet. Her CT scan showed some lesions in the right hepatic lobe, small lesions in around the right kidney, pulmonary lesions said to be hematoma, 10 mm rounded lesion along the anterior chest wall below the thyroid.  She has appointment with Dr. Ellin Saba coming up on August 19, 2023.  She denies palpitations, tremor, nor heat/cold intolerance. She denies family history of thyroid malignancy, however reports various forms of thyroid dysfunction in her family members including her mother and sisters.    Presurgical chest x-ray showed 3.3 cm mass which was later said to be hamartoma.    CT scan however showed questionable renal lesion, unspecified liver lesions which are not seen on the MRI.    Review of Systems  Constitutional: + Steady weight,  no fatigue, no subjective hyperthermia, no subjective hypothermia Eyes: no blurry vision, no xerophthalmia ENT: no sore throat, no nodules palpated in throat, no dysphagia/odynophagia, no hoarseness   Objective:       08/12/2023   10:44 AM 07/10/2023   11:16 AM 04/08/2023    9:49 AM  Vitals with BMI  Height 5\' 5"   5\' 5"   Weight 159 lbs 6 oz  162 lbs 13 oz  BMI 26.53  27.09  Systolic 122 160 161  Diastolic 74 78 74  Pulse 84 74 72    BP 122/74   Pulse 84   Ht 5\' 5"  (1.651 m)   Wt 159 lb 6.4 oz (72.3 kg)   BMI 26.53 kg/m   Wt  Readings from Last 3 Encounters:  08/12/23 159 lb 6.4 oz (72.3 kg)  04/08/23 162 lb 12.8 oz (73.8 kg)  02/19/23 158 lb 14.4 oz (72.1 kg)    Physical Exam  Constitutional:  Body mass index is 26.53 kg/m.,  not in acute distress, normal state of mind Eyes: PERRLA, EOMI, no exophthalmos ENT: moist mucous membranes, + post thyroidectomy scar, no gross cervical lymphadenopathy   Thyroid ultrasound  August 06, 2020:  Right lobe 3.1 cm with 2.1 suspicious nodule. Left lobe 3.4 cm with 1.2 cm nonsuspicious nodule.  Fine-needle aspiration on December 12, 2020 of right-sided thyroid nodule Clinical History: 2.1 cm RML  Specimen Submitted:  A. THYROID, RIGHT, FINE NEEDLE ASPIRATION:   FINAL MICROSCOPIC DIAGNOSIS:  - Malignant cells present (Bethesda category VI)   SPECIMEN ADEQUACY:  Satisfactory for evaluation    Surgical sample from March 26, 2021 FINAL MICROSCOPIC DIAGNOSIS:   A. THYROID, TOTAL THYROIDECTOMY:  - Medullary thyroid carcinoma, 3.1 cm.  - Tumor confined within the thyroid capsule.  - Margins not involved.  - One perithyroidal lymph node negative for metastatic carcinoma (0/1).  - See oncology table.   B. LYMPH NODE, RIGHT PARATRACHEAL, EXCISION:  - Metastatic medullary carcinoma in one lymph node (1/1).   C. LYMPH NODE, CENTRAL COMPARTMENT, EXCISION:  - Metastatic medullary carcinoma in one of four lymph nodes (1/4).   April 26, 2021 MRI abdomen with and without contrast:  IMPRESSION: 1. Lower pole right renal lesion is favored to represent a cystic renal cell carcinoma. Bosniak IV (2019). An adjacent area of more central precontrast T1 hyperintensity may represent a separate hemorrhagic cyst or less likely be part of the exophytic lesion. 2. Central upper pole right renal lesion is favored to represent a complex renal sinus cyst but is indeterminate. Presuming the patient does not undergo complete nephrectomy, recommend follow-up pre and post contrast  abdominal MRI at 6 months. 3. No suspicious liver lesion. 4. No evidence of abdominal metastatic disease. 5. Right lower lobe hamartoma. 6.  Aortic Atherosclerosis (ICD10-I70.0).     Assessment & Plan:   1.  Hypothyroidism following radioiodine therapy 2.  Medullary thyroid carcinoma 3-hypokalemia  For her postsurgical hypothyroidism, her previsit thyroid function tests are consistent with slight over replacement.  I discussed and prescribed levothyroxine 62.5 mcg p.o. daily before breakfast (half tablet of 125 mcg).   - We discussed about the correct intake of her thyroid hormone, on empty stomach at fasting, with water, separated by at least 30 minutes from breakfast and other medications,  and separated by more than 4 hours from calcium, iron, multivitamins, acid reflux medications (PPIs). -Patient is made aware of the fact that thyroid hormone replacement is needed for life, dose to be adjusted by periodic monitoring of thyroid function tests.   -I reviewed her  imaging studies, genetic testing and lab work. - Her surgical cytology revealed locally invasive, possibly distant metastatic medullary thyroid cancer-involving at least 2 lymph nodes in the neck.  She is status post total thyroidectomy with limited neck dissection.  She was subsequently worked up for staging imaging as well as genetic testing.  Genetic test is not showing hereditary cause.  Her imaging studies are abnormal along with persistently elevated calcitonin currently at 110-elevated compared to 86.7 during last measurement.  Her CEA is 4.6 remains stable.    She was previously seen by ENT surgeon Dr. Beverlee Nims. CT neck/thorax did not reveal an identifiable surgical lesion.  Left thoracic wall lesion was described as possible epidermal inclusion  cyst.   She has not undergone any additional surgery chemotherapy yet.  Imaging with CT and MRI showed more lesions in the kidneys, currently undergoing evaluation by  urology.   She has appointment coming up with Dr. Ellin Saba on August 19, 2023, advised to maintain close follow-up with all of her providers including oncology and urology.  Hypokalemia: Her potassium is now corrected at 3.6.  She  will benefit from staying on low-dose potassium supplement at 20 mEq daily.      - she is advised to maintain close follow up with Avis Epley, PA-C for primary care needs.   I spent  22  minutes in the care of the patient today including review of labs from Thyroid Function, CMP, and other relevant labs ; imaging/biopsy records (current and previous including abstractions from other facilities); face-to-face time discussing  her lab results and symptoms, medications doses, her options of short and long term treatment based on the latest standards of care / guidelines;   and documenting the encounter.  Theresa Buchanan  participated in the discussions, expressed understanding, and voiced agreement with the above plans.  All questions were answered to her satisfaction. she is encouraged to contact clinic should she have any questions or concerns prior to her return visit.   Follow up plan: Return in about 6 months (around 02/09/2024) for F/U with Pre-visit Labs.   Marquis Lunch, MD [facility] [address] [place], [place] [address] Phone: [phone]  Fax: [phone]     08/12/2023, 3:21 PM  This note was partially dictated with voice recognition software. Similar sounding words can be transcribed inadequately or may not  be corrected upon review.

## 2023-08-19 ENCOUNTER — Ambulatory Visit (HOSPITAL_COMMUNITY): Payer: Medicare HMO

## 2023-08-19 ENCOUNTER — Inpatient Hospital Stay: Payer: Medicare HMO | Attending: Hematology

## 2023-08-19 DIAGNOSIS — D649 Anemia, unspecified: Secondary | ICD-10-CM | POA: Diagnosis not present

## 2023-08-19 DIAGNOSIS — N2889 Other specified disorders of kidney and ureter: Secondary | ICD-10-CM

## 2023-08-19 DIAGNOSIS — R7989 Other specified abnormal findings of blood chemistry: Secondary | ICD-10-CM | POA: Diagnosis not present

## 2023-08-19 DIAGNOSIS — C73 Malignant neoplasm of thyroid gland: Secondary | ICD-10-CM | POA: Diagnosis not present

## 2023-08-19 DIAGNOSIS — N281 Cyst of kidney, acquired: Secondary | ICD-10-CM | POA: Diagnosis not present

## 2023-08-19 DIAGNOSIS — Z809 Family history of malignant neoplasm, unspecified: Secondary | ICD-10-CM | POA: Diagnosis not present

## 2023-08-19 LAB — COMPREHENSIVE METABOLIC PANEL
ALT: 12 U/L (ref 0–44)
AST: 16 U/L (ref 15–41)
Albumin: 3.9 g/dL (ref 3.5–5.0)
Alkaline Phosphatase: 50 U/L (ref 38–126)
Anion gap: 9 (ref 5–15)
BUN: 19 mg/dL (ref 8–23)
CO2: 30 mmol/L (ref 22–32)
Calcium: 9.7 mg/dL (ref 8.9–10.3)
Chloride: 100 mmol/L (ref 98–111)
Creatinine, Ser: 1.13 mg/dL — ABNORMAL HIGH (ref 0.44–1.00)
GFR, Estimated: 51 mL/min — ABNORMAL LOW (ref >60)
Glucose, Bld: 99 mg/dL (ref 70–99)
Potassium: 3.5 mmol/L (ref 3.5–5.1)
Sodium: 139 mmol/L (ref 135–145)
Total Bilirubin: 0.7 mg/dL (ref 0.0–1.2)
Total Protein: 7.6 g/dL (ref 6.5–8.1)

## 2023-08-19 LAB — CBC WITH DIFFERENTIAL/PLATELET
Abs Immature Granulocytes: 0.01 10*3/uL (ref 0.00–0.07)
Basophils Absolute: 0.1 10*3/uL (ref 0.0–0.1)
Basophils Relative: 1 %
Eosinophils Absolute: 0.2 10*3/uL (ref 0.0–0.5)
Eosinophils Relative: 4 %
HCT: 35.4 % — ABNORMAL LOW (ref 36.0–46.0)
Hemoglobin: 11.6 g/dL — ABNORMAL LOW (ref 12.0–15.0)
Immature Granulocytes: 0 %
Lymphocytes Relative: 26 %
Lymphs Abs: 1.4 10*3/uL (ref 0.7–4.0)
MCH: 26.6 pg (ref 26.0–34.0)
MCHC: 32.8 g/dL (ref 30.0–36.0)
MCV: 81.2 fL (ref 80.0–100.0)
Monocytes Absolute: 0.7 10*3/uL (ref 0.1–1.0)
Monocytes Relative: 13 %
Neutro Abs: 3.1 10*3/uL (ref 1.7–7.7)
Neutrophils Relative %: 56 %
Platelets: 274 10*3/uL (ref 150–400)
RBC: 4.36 MIL/uL (ref 3.87–5.11)
RDW: 14.9 % (ref 11.5–15.5)
WBC: 5.5 10*3/uL (ref 4.0–10.5)
nRBC: 0 % (ref 0.0–0.2)

## 2023-08-20 LAB — CEA: CEA: 4.1 ng/mL (ref 0.0–4.7)

## 2023-08-21 LAB — CALCITONIN: Calcitonin: 95 pg/mL — ABNORMAL HIGH (ref 0.0–5.0)

## 2023-08-25 NOTE — Progress Notes (Signed)
 [facility] 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 - FNA of multinodular goiter in June 2022 showed positive for malignancy. - Total thyroidectomy with limited central compartment lymph node dissection by Dr. Gerrit Friends on 03/26/2021 - Pathology shows medullary thyroid cancer, 3.1 x 1.5 x 1.5 cm, tumor confined within the thyroid capsule, margins negative.  0/1 perithyroidal lymph node negative.  1/1 metastatic medullary carcinoma in the right paratracheal lymph node.  1/4 metastatic lymph node in the central compartment.  pT2, PN1a. - CT CAP with contrast on 04/09/2021 showed hypodense hepatic lesions, very small seen throughout the right hepatic lobe, some of which are low-density.  Heterogeneous enhancing lesion in the lower pole of the right kidney measuring 18 x 14 mm.  Second area in the medial right kidney measuring 18 x 18 mm.  8 mm solid-appearing lesion in the inferior right kidney lower pole.  Pulmonary hamartoma in the right lower lobe measuring 3 cm. - Labs on 04/03/2021 with calcitonin 136 and CEA 62.9. - NGS testing on 04/25/2021-BRAF negative, RET fusion not detected.  MSI-stable.  MMR-proficient.  NTRK 1/2/3 not detected.  TMB low.  PD-L1 is negative.  HRAS pathogenic variant exon 3. - 24-hour urine metanephrines and plasma metanephrines normal. - Bone scan from 04/22/2021 showed punctate area of increased uptake projected over right posterior 12th rib, can also represent renal activity. - Rib series on the right side did not show any acute or focal rib abnormality. - She was referred to Dr. Gerrit Friends for elevated  calcitonin levels. - Dr. Gerrit Friends referred her to Dr. Pollyann Kennedy who did not feel that she needs surgery. - She continues to have elevated calcitonin levels with normal CEA level.   2. Family/social history: - Lives at home with her husband.  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  She is non-smoker. - Her mother and sister had thyroid problems but no history of malignancies.    Plan: 1. PT 2 PN 1A medullary thyroid carcinoma: - She does not report any new onset pains.  No palpable lymphadenopathy in the neck region. - Labs from 08/19/2023: Normal LFTs.  Creatinine mildly elevated and stable.  CBC grossly normal with mild anemia.  Calcitonin is 95, down from 110.  CEA is normal at 4.1. - Her insurance would not authorize CT scan of the neck. - Recommend follow-up in 6 months with repeat labs and tumor markers.  2.  Right kidney lesion: - MRI on 07/09/2023: Multiple heterogeneously enhancing mixed solid and cystic mass of the right kidney unchanged.  No evidence of renal vein invasion, lymphadenopathy or metastasis.  Continue follow-up with Dr. Ronne Binning.    Orders Placed This Encounter  Procedures   CBC with Differential    Standing Status:   Future    Expected Date:   02/22/2024    Expiration Date:   08/25/2024   Comprehensive metabolic panel    Standing Status:   Future    Expected Date:   02/22/2024    Expiration Date:   08/25/2024   CEA    Standing Status:   Future    Expected Date:  02/22/2024    Expiration Date:   08/25/2024   Calcitonin    Standing Status:   Future    Expected Date:   02/22/2024    Expiration Date:   08/25/2024   Iron and TIBC ([facility] ONLY)    Standing Status:   Future    Expected Date:   02/22/2024    Expiration Date:   08/25/2024   Ferritin    Standing Status:   Future    Expected Date:   02/22/2024    Expiration Date:   08/25/2024     Mikeal Hawthorne R Teague,acting as a scribe for Doreatha Massed, MD.,have documented all  relevant documentation on the behalf of Doreatha Massed, MD,as directed by  Doreatha Massed, MD while in the presence of Doreatha Massed, MD.  I, Doreatha Massed MD, have reviewed the above documentation for accuracy and completeness, and I agree with the above.     Doreatha Massed, MD   3/12/20254:31 PM  CHIEF COMPLAINT:   Diagnosis: medullary thyroid carcinoma    Cancer Staging  Medullary thyroid carcinoma [facility]) Staging form: Thyroid - Medullary, AJCC 8th Edition - Clinical stage from 04/15/2021: Stage IVC (cT2, cN1a, cM1) - Unsigned    Prior Therapy: Total thyroidectomy with limited central compartment lymph node dissection by Dr. Gerrit Friends on 03/26/2021   Current Therapy:  surveillance   HISTORY OF PRESENT ILLNESS:   Oncology History  Medullary thyroid carcinoma (HCC)  03/17/2021 Initial Diagnosis   Medullary thyroid carcinoma (HCC)    Genetic Testing   Negative genetic testing. No pathogenic variants identified on the [facility] Multi-Cancer+RNA panel. VUS in SUFU called c.865C>T identified. The report date is 05/17/2021.  The Multi-Cancer Panel + RNA offered by [facility] includes sequencing and/or deletion duplication testing of the following 84 genes: AIP, ALK, APC, ATM, AXIN2,BAP1,  BARD1, BLM, BMPR1A, BRCA1, BRCA2, BRIP1, CASR, CDC73, CDH1, CDK4, CDKN1B, CDKN1C, CDKN2A (p14ARF), CDKN2A (p16INK4a), CEBPA, CHEK2, CTNNA1, DICER1, DIS3L2, EGFR (c.2369C>T, p.Thr790Met variant only), EPCAM (Deletion/duplication testing only), FH, FLCN, GATA2, GPC3, GREM1 (Promoter region deletion/duplication testing only), HOXB13 (c.251G>A, p.Gly84Glu), HRAS, KIT, MAX, MEN1, MET, MITF (c.952G>A, p.Glu318Lys variant only), MLH1, MSH2, MSH3, MSH6, MUTYH, NBN, NF1, NF2, NTHL1, PALB2, PDGFRA, PHOX2B, PMS2, POLD1, POLE, POT1, PRKAR1A, PTCH1, PTEN, RAD50, RAD51C, RAD51D, RB1, RECQL4, RET, RUNX1, SDHAF2, SDHA (sequence changes only), SDHB, SDHC, SDHD, SMAD4, SMARCA4, SMARCB1, SMARCE1, STK11,  SUFU, TERC, TERT, TMEM127, TP53, TSC1, TSC2, VHL, WRN and WT1.      INTERVAL HISTORY:   Theresa Buchanan is a 78 y.o. female presenting to clinic today for follow up of medullary thyroid carcinoma. She was last seen by me on 02/19/23.  Since her last visit, she underwent an MRI of the abdomen on 07/09/23 that found: Multiple heterogeneously enhancing mixed solid and cystic masses of the right kidney are unchanged, of the medial superior pole of the right kidney and right renal sinus measuring 2.4 x 1.7 cm, as well as two closely adjacent lesions of the medial inferior pole of the right kidney measuring 1.5 x 1.4 cm and 1.9 x 1.4 cm. There is an additional tiny heterogeneously enhancing mass of the lateral midportion of the right kidney which was not previously noted but not significantly changed, measuring 0.5 cm. Findings are consistent with multifocal renal cell carcinoma. No evidence of renal vein invasion, lymphadenopathy or metastatic disease in the abdomen.  Today, she states that she is doing well overall. Her appetite level is at 75%. Her energy level is at 50%.   PAST MEDICAL HISTORY:  Past Medical History: Past Medical History:  Diagnosis Date   Family history of cancer    Hypertension    Thyroid disease     Surgical History: Past Surgical History:  Procedure Laterality Date   ABDOMINAL HYSTERECTOMY     BREAST BIOPSY Left 05/18/2019   Intraductal papilloma   COLON SURGERY     THYROIDECTOMY N/A 03/26/2021   Procedure: TOTAL THYROIDECTOMY WITH LIMITED LYMPH NODE DISSECTION;  Surgeon: Darnell Level, MD;  Location: [facility];  Service: General;  Laterality: N/A;   TUBAL LIGATION      Social History: Social History   Socioeconomic History   Marital status: Married    Spouse name: Not on file   Number of children: Not on file   Years of education: Not on file   Highest education level: Not on file  Occupational History   Not on file  Tobacco Use   Smoking status: Never    Smokeless tobacco: Never  Vaping Use   Vaping status: Never Used  Substance and Sexual Activity   Alcohol use: Never   Drug use: Never   Sexual activity: Not on file  Other Topics Concern   Not on file  Social History Narrative   Not on file   Social Drivers of Health   Financial Resource Strain: Not on file  Food Insecurity: Not on file  Transportation Needs: Not on file  Physical Activity: Not on file  Stress: Not on file  Social Connections: Not on file  Intimate Partner Violence: Not on file    Family History: Family History  Problem Relation Age of Onset   Hypertension Mother    Thyroid disease Mother    Diabetes Mother    Cancer Maternal Aunt        unk types   Cancer Maternal Uncle        unk types    Current Medications:  Current Outpatient Medications:    aspirin EC 81 MG tablet, Take 81 mg by mouth 2 (two) times a week., Disp: , Rfl:    calcium carbonate (TUMS) 500 MG chewable tablet, Chew 2 tablets (400 mg of elemental calcium total) by mouth 2 (two) times daily., Disp: 90 tablet, Rfl: 1   Camphor-Menthol-Methyl Sal (SALONPAS) 3.06-21-08 % PTCH, Place 1 application onto the skin daily as needed (pain)., Disp: , Rfl:    levothyroxine (SYNTHROID) 125 MCG tablet, Take 0.5 tablets (62.5 mcg total) by mouth daily before breakfast., Disp: 45 tablet, Rfl: 1   lisinopril-hydrochlorothiazide (ZESTORETIC) 20-25 MG tablet, Take 1 tablet by mouth daily., Disp: , Rfl:    Polyethyl Glycol-Propyl Glycol (SYSTANE OP), Place 1 drop into both eyes 2 (two) times daily as needed (dry eyes)., Disp: , Rfl:    potassium chloride SA (KLOR-CON M) 20 MEQ tablet, Take 1 tablet (20 mEq total) by mouth 2 (two) times daily., Disp: 180 tablet, Rfl: 0   traMADol (ULTRAM) 50 MG tablet, Take 50 mg by mouth every 6 (six) hours as needed., Disp: , Rfl:    trolamine salicylate (ASPERCREME) 10 % cream, Apply 1 application topically as needed for muscle pain., Disp: , Rfl:    Allergies: No Known  Allergies  REVIEW OF SYSTEMS:   Review of Systems  Constitutional:  Positive for fatigue. Negative for chills and fever.  HENT:   Negative for lump/mass, mouth sores, nosebleeds, sore throat and trouble swallowing.   Eyes:  Negative for eye problems.  Respiratory:  Negative for cough and shortness of breath.  Cardiovascular:  Negative for chest pain, leg swelling and palpitations.  Gastrointestinal:  Negative for abdominal pain, constipation, diarrhea, nausea and vomiting.  Genitourinary:  Negative for bladder incontinence, difficulty urinating, dysuria, frequency, hematuria and nocturia.   Musculoskeletal:  Negative for arthralgias, back pain, flank pain, myalgias and neck pain.  Skin:  Negative for itching and rash.  Neurological:  Negative for dizziness, headaches and numbness.  Hematological:  Does not bruise/bleed easily.  Psychiatric/Behavioral:  Negative for depression, sleep disturbance and suicidal ideas. The patient is not nervous/anxious.   All other systems reviewed and are negative.    VITALS:   Blood pressure (!) 142/63, pulse 87, temperature 98.3 F (36.8 C), temperature source Oral, resp. rate 18, weight 159 lb 2.8 oz (72.2 kg), SpO2 99%.  Wt Readings from Last 3 Encounters:  08/26/23 159 lb 2.8 oz (72.2 kg)  08/12/23 159 lb 6.4 oz (72.3 kg)  04/08/23 162 lb 12.8 oz (73.8 kg)    Body mass index is 26.49 kg/m.  Performance status (ECOG): 1 - Symptomatic but completely ambulatory  PHYSICAL EXAM:   Physical Exam Vitals and nursing note reviewed. Exam conducted with a chaperone present.  Constitutional:      Appearance: Normal appearance.  Cardiovascular:     Rate and Rhythm: Normal rate and regular rhythm.     Pulses: Normal pulses.     Heart sounds: Normal heart sounds.  Pulmonary:     Effort: Pulmonary effort is normal.     Breath sounds: Normal breath sounds.  Abdominal:     Palpations: Abdomen is soft. There is no hepatomegaly, splenomegaly or mass.      Tenderness: There is no abdominal tenderness.  Musculoskeletal:     Right lower leg: No edema.     Left lower leg: No edema.  Lymphadenopathy:     Cervical: No cervical adenopathy.     Right cervical: No superficial, deep or posterior cervical adenopathy.    Left cervical: No superficial, deep or posterior cervical adenopathy.     Upper Body:     Right upper body: No supraclavicular or axillary adenopathy.     Left upper body: No supraclavicular or axillary adenopathy.  Neurological:     General: No focal deficit present.     Mental Status: She is alert and oriented to person, place, and time.  Psychiatric:        Mood and Affect: Mood normal.        Behavior: Behavior normal.     LABS:      Latest Ref Rng & Units 08/19/2023    9:40 AM 02/12/2023    2:49 PM 06/27/2022    1:30 PM  CBC  WBC 4.0 - 10.5 K/uL 5.5  5.7  6.8   Hemoglobin 12.0 - 15.0 g/dL 04.5  40.9  81.1   Hematocrit 36.0 - 46.0 % 35.4  36.3  37.1   Platelets 150 - 400 K/uL 274  312  309       Latest Ref Rng & Units 08/19/2023    9:40 AM 08/05/2023    9:28 AM 02/12/2023    2:49 PM  CMP  Glucose 70 - 99 mg/dL 99  94  914   BUN 8 - 23 mg/dL 19  17  19    Creatinine 0.44 - 1.00 mg/dL 7.82  9.56  2.13   Sodium 135 - 145 mmol/L 139  143  134   Potassium 3.5 - 5.1 mmol/L 3.5  3.6  3.1   Chloride 98 -  111 mmol/L 100  101  95   CO2 22 - 32 mmol/L 30  26  29    Calcium 8.9 - 10.3 mg/dL 9.7  69.6  9.2   Total Protein 6.5 - 8.1 g/dL 7.6  7.4  7.5   Total Bilirubin 0.0 - 1.2 mg/dL 0.7  0.4  0.7   Alkaline Phos 38 - 126 U/L 50  63  50   AST 15 - 41 U/L 16  13  19    ALT 0 - 44 U/L 12  10  15       Lab Results  Component Value Date   CEA1 4.1 08/19/2023   /  CEA  Date Value Ref Range Status  08/19/2023 4.1 0.0 - 4.7 ng/mL Final    Comment:    (NOTE)                             Nonsmokers          <3.9                             Smokers             <5.6 [employer] Electrochemiluminescence  Immunoassay (ECLIA) Values obtained with different assay methods or kits cannot be used interchangeably.  Results cannot be interpreted as absolute evidence of the presence or absence of malignant disease. Performed At: [facility] [address] [place], [place] [id number] Jolene Schimke MD GM:[id number]    No results found for: "PSA1" No results found for: "[id number]" No results found for: "CAN125"  No results found for: "TOTALPROTELP", "ALBUMINELP", "A1GS", "A2GS", "BETS", "BETA2SER", "GAMS", "MSPIKE", "SPEI" No results found for: "TIBC", "FERRITIN", "IRONPCTSAT" No results found for: "LDH"   STUDIES:   No results found.

## 2023-08-26 ENCOUNTER — Inpatient Hospital Stay (HOSPITAL_BASED_OUTPATIENT_CLINIC_OR_DEPARTMENT_OTHER): Payer: Medicare HMO | Admitting: Hematology

## 2023-08-26 VITALS — BP 142/63 | HR 87 | Temp 98.3°F | Resp 18 | Wt 159.2 lb

## 2023-08-26 DIAGNOSIS — C73 Malignant neoplasm of thyroid gland: Secondary | ICD-10-CM | POA: Diagnosis not present

## 2023-08-26 DIAGNOSIS — Z809 Family history of malignant neoplasm, unspecified: Secondary | ICD-10-CM | POA: Diagnosis not present

## 2023-08-26 DIAGNOSIS — R7989 Other specified abnormal findings of blood chemistry: Secondary | ICD-10-CM | POA: Diagnosis not present

## 2023-08-26 DIAGNOSIS — N2889 Other specified disorders of kidney and ureter: Secondary | ICD-10-CM

## 2023-08-26 DIAGNOSIS — N281 Cyst of kidney, acquired: Secondary | ICD-10-CM | POA: Diagnosis not present

## 2023-08-26 DIAGNOSIS — D649 Anemia, unspecified: Secondary | ICD-10-CM | POA: Diagnosis not present

## 2023-08-26 NOTE — Patient Instructions (Addendum)

## 2023-12-21 ENCOUNTER — Ambulatory Visit (HOSPITAL_COMMUNITY)
Admission: RE | Admit: 2023-12-21 | Discharge: 2023-12-21 | Disposition: A | Discharge status: Home / Self Care | Payer: Medicare HMO | Source: Ambulatory Visit | Attending: Urology | Admitting: Urology

## 2023-12-21 DIAGNOSIS — N281 Cyst of kidney, acquired: Secondary | ICD-10-CM | POA: Diagnosis not present

## 2023-12-21 DIAGNOSIS — N2889 Other specified disorders of kidney and ureter: Secondary | ICD-10-CM | POA: Diagnosis not present

## 2023-12-21 DIAGNOSIS — N289 Disorder of kidney and ureter, unspecified: Secondary | ICD-10-CM | POA: Diagnosis not present

## 2023-12-22 DIAGNOSIS — H5213 Myopia, bilateral: Secondary | ICD-10-CM | POA: Diagnosis not present

## 2024-01-11 ENCOUNTER — Ambulatory Visit: Payer: Medicare HMO | Admitting: Urology

## 2024-02-01 DIAGNOSIS — E890 Postprocedural hypothyroidism: Secondary | ICD-10-CM | POA: Diagnosis not present

## 2024-02-02 LAB — T4, FREE: Free T4: 1.27 ng/dL (ref 0.82–1.77)

## 2024-02-02 LAB — TSH: TSH: 54.3 u[IU]/mL — ABNORMAL HIGH (ref 0.450–4.500)

## 2024-02-09 ENCOUNTER — Ambulatory Visit: Payer: Medicare HMO | Admitting: "Endocrinology

## 2024-02-10 ENCOUNTER — Encounter: Payer: Self-pay | Admitting: "Endocrinology

## 2024-02-10 ENCOUNTER — Other Ambulatory Visit: Payer: Self-pay | Admitting: "Endocrinology

## 2024-02-10 ENCOUNTER — Ambulatory Visit: Admitting: "Endocrinology

## 2024-02-10 VITALS — BP 126/58 | HR 72 | Ht 65.0 in | Wt 150.8 lb

## 2024-02-10 DIAGNOSIS — E876 Hypokalemia: Secondary | ICD-10-CM

## 2024-02-10 DIAGNOSIS — E890 Postprocedural hypothyroidism: Secondary | ICD-10-CM | POA: Diagnosis not present

## 2024-02-10 DIAGNOSIS — C73 Malignant neoplasm of thyroid gland: Secondary | ICD-10-CM | POA: Diagnosis not present

## 2024-02-10 MED ORDER — LIOTHYRONINE SODIUM 5 MCG PO TABS: 5.0000 ug | ORAL_TABLET | Freq: Every day | ORAL | 1 refills | Status: AC

## 2024-02-10 NOTE — Progress Notes (Signed)
 02/10/2024, 5:53 PM  Endocrinology follow-up note   Subjective:    Patient ID: Theresa Buchanan, female    DOB: 02-Oct-1945, PCP Theresa Lucie JINNY, Theresa Buchanan   Past Medical History:  Diagnosis Date   Family history of cancer    Hypertension    Thyroid  disease    Past Surgical History:  Procedure Laterality Date   ABDOMINAL HYSTERECTOMY     BREAST BIOPSY Left 05/18/2019   Intraductal papilloma   COLON SURGERY     THYROIDECTOMY N/A 03/26/2021   Procedure: TOTAL THYROIDECTOMY WITH LIMITED LYMPH NODE DISSECTION;  Surgeon: Eletha Boas, MD;  Location: [facility];  Service: General;  Laterality: N/A;   TUBAL LIGATION     Social History   Socioeconomic History   Marital status: Married    Spouse name: Not on file   Number of children: Not on file   Years of education: Not on file   Highest education level: Not on file  Occupational History   Not on file  Tobacco Use   Smoking status: Never   Smokeless tobacco: Never  Vaping Use   Vaping status: Never Used  Substance and Sexual Activity   Alcohol use: Never   Drug use: Never   Sexual activity: Not on file  Other Topics Concern   Not on file  Social History Narrative   Not on file   Social Drivers of Health   Financial Resource Strain: Not on file  Food Insecurity: Not on file  Transportation Needs: Not on file  Physical Activity: Not on file  Stress: Not on file  Social Connections: Not on file   Family History  Problem Relation Age of Onset   Hypertension Mother    Thyroid  disease Mother    Diabetes Mother    Cancer Maternal Aunt        unk types   Cancer Maternal Uncle        unk types   Outpatient Encounter Medications as of 02/10/2024  Medication Sig   liothyronine  (CYTOMEL ) 5 MCG tablet Take 1 tablet (5 mcg total) by mouth daily.   aspirin EC 81 MG tablet Take 81 mg by mouth 2 (two) times a week.   calcium  carbonate (TUMS) 500 MG chewable tablet Chew 2  tablets (400 mg of elemental calcium  total) by mouth 2 (two) times daily.   Camphor-Menthol-Methyl Sal (SALONPAS) 3.06-21-08 % PTCH Place 1 application onto the skin daily as needed (pain).   levothyroxine  (SYNTHROID ) 125 MCG tablet Take 0.5 tablets (62.5 mcg total) by mouth daily before breakfast.   lisinopril -hydrochlorothiazide  (ZESTORETIC ) 20-25 MG tablet Take 1 tablet by mouth daily.   Polyethyl Glycol-Propyl Glycol (SYSTANE OP) Place 1 drop into both eyes 2 (two) times daily as needed (dry eyes).   potassium chloride  SA (KLOR-CON  M) 20 MEQ tablet Take 1 tablet (20 mEq total) by mouth 2 (two) times daily.   traMADol  (ULTRAM ) 50 MG tablet Take 50 mg by mouth every 6 (six) hours as needed.   trolamine salicylate (ASPERCREME) 10 % cream Apply 1 application topically as needed for muscle pain.   No facility-administered encounter medications on file as of 02/10/2024.   ALLERGIES: No Known Allergies  VACCINATION STATUS:  There is no  immunization history on file for this patient.  HPI Theresa Buchanan is 78 y.o. female who presents today with a medical history as above. she follows in this clinic for RAI induced hypothyroidism.  Currently on levothyroxine  62.5 mcg p.o. daily before breakfast.  Her previsit labs are still discordant with TSH significant above target while free T4 is on target.    She does not have acute complaints today.  She presents with about 9 pounds of weight loss since last visit.  She denies palpitations, tremors, nor heat intolerance.  She denies dysphagia, dysphonia nor shortness of breath.  -----------------------------------------------  -Her medical history is complicated.  She has medical history of hypothyroidism from radioactive iodine thyroid  ablation.  She was later found to have  multinodular goiter recently, in February 2022. Fine-needle aspiration in June 2022 showed thyroid  malignancy. She was sent for total thyroidectomy which she underwent on March 26, 2021.  She has recovered from her surgery.  Her surgical sample showed medullary thyroid  cancer measuring 3.1 cm, on the right lobe. Metastatic medullary thyroid  carcinoma was found in 1 lymph node on the right paratracheal groove, and 1 out of 4 positive lymph node in the central compartment. She followed  with her oncologist Dr. Rogers.  She has gone through staging imaging, genetic testing.  Her genetic testing did not show any pathological mutations-hereditary cause was not determined.  Calcitonin continues to be elevated, currently at 110, has been as high as 136 in October 2022.  Her CEA is down to 4.6, has been as high as 62.9 in October 2022.  She was evaluated by ENT surgeon with repeat CT scan.  She was not found to have identifiable surgically amenable lesion.  She has left thoracic wall lesion which was thought to be cyst. She has not received any adjuvant chemotherapy agent yet. Her CT scan showed some lesions in the right hepatic lobe, small lesions in around the right kidney, pulmonary lesions said to be hematoma, 10 mm rounded lesion along the anterior chest wall below the thyroid .  Her malignancy tumor markers on August 19, 2023 showed calcitonin  high at 95 and CEA of 4.1.  -She is scheduled to have neck CT scan of neck and chest/abdomen ordered by her oncologist for February 19, 2024 prior to her next appointment with oncology. She denies family history of thyroid  malignancy, however reports various forms of thyroid  dysfunction in her family members including her mother and sisters.    Presurgical chest x-ray showed 3.3 cm mass which was later said to be hamartoma.    CT scan however showed questionable renal lesion, unspecified liver lesions which are not seen on the MRI.    Review of Systems  Constitutional: + Steady weight,  no fatigue, no subjective hyperthermia, no subjective hypothermia Eyes: no blurry vision, no xerophthalmia ENT: no sore throat, no nodules palpated  in throat, no dysphagia/odynophagia, no hoarseness   Objective:       02/10/2024    2:43 PM 08/26/2023    3:04 PM 08/12/2023   10:44 AM  Vitals with BMI  Height 5' 5  5' 5  Weight 150 lbs 13 oz 159 lbs 3 oz 159 lbs 6 oz  BMI 25.09 26.49 26.53  Systolic 126 142 877  Diastolic 58 63 74  Pulse 72 87 84    BP (!) 126/58   Pulse 72   Ht 5' 5 (1.651 m)   Wt 150 lb 12.8 oz (68.4 kg)   BMI 25.09  kg/m   Wt Readings from Last 3 Encounters:  02/10/24 150 lb 12.8 oz (68.4 kg)  08/26/23 159 lb 2.8 oz (72.2 kg)  08/12/23 159 lb 6.4 oz (72.3 kg)    Physical Exam  Constitutional:  Body mass index is 25.09 kg/m.,  not in acute distress, normal state of mind Eyes: PERRLA, EOMI, no exophthalmos ENT: moist mucous membranes, + post thyroidectomy scar, no gross cervical lymphadenopathy   Thyroid  ultrasound August 06, 2020:  Right lobe 3.1 cm with 2.1 suspicious nodule. Left lobe 3.4 cm with 1.2 cm nonsuspicious nodule.  Fine-needle aspiration on December 12, 2020 of right-sided thyroid  nodule Clinical History: 2.1 cm RML  Specimen Submitted:  A. THYROID , RIGHT, FINE NEEDLE ASPIRATION:   FINAL MICROSCOPIC DIAGNOSIS:  - Malignant cells present (Bethesda category VI)   SPECIMEN ADEQUACY:  Satisfactory for evaluation    Surgical sample from March 26, 2021 FINAL MICROSCOPIC DIAGNOSIS:   A. THYROID , TOTAL THYROIDECTOMY:  - Medullary thyroid  carcinoma, 3.1 cm.  - Tumor confined within the thyroid  capsule.  - Margins not involved.  - One perithyroidal lymph node negative for metastatic carcinoma (0/1).  - See oncology table.   B. LYMPH NODE, RIGHT PARATRACHEAL, EXCISION:  - Metastatic medullary carcinoma in one lymph node (1/1).   C. LYMPH NODE, CENTRAL COMPARTMENT, EXCISION:  - Metastatic medullary carcinoma in one of four lymph nodes (1/4).   April 26, 2021 MRI abdomen with and without contrast:  IMPRESSION: 1. Lower pole right renal lesion is favored to represent a  cystic renal cell carcinoma. Bosniak IV (2019). An adjacent area of more central precontrast T1 hyperintensity may represent a separate hemorrhagic cyst or less likely be part of the exophytic lesion. 2. Central upper pole right renal lesion is favored to represent a complex renal sinus cyst but is indeterminate. Presuming the patient does not undergo complete nephrectomy, recommend follow-up pre and post contrast abdominal MRI at 6 months. 3. No suspicious liver lesion. 4. No evidence of abdominal metastatic disease. 5. Right lower lobe hamartoma. 6.  Aortic Atherosclerosis (ICD10-I70.0).     Assessment & Plan:   1.  Hypothyroidism following radioiodine therapy 2.  Medullary thyroid  carcinoma   For her postsurgical hypothyroidism, her previsit thyroid  function tests are still discordant with free T4 on target at 1.27 and TSH lagging at 54.3.   In light of her presentation with unintended weight loss, she would not tolerate a higher dose of levothyroxine .    I advised her to continue levothyroxine  62.5 mcg p.o. daily before breakfast (half tablet of 125 mcg).  This patient will benefit from low-dose Cytomel , discussed and prescribed Cytomel  5 mcg p.o. daily at breakfast.   - We discussed about the correct intake of her thyroid  hormone, on empty stomach at fasting, with water, separated by at least 30 minutes from breakfast and other medications,  and separated by more than 4 hours from calcium , iron, multivitamins, acid reflux medications (PPIs). -Patient is made aware of the fact that thyroid  hormone replacement is needed for life, dose to be adjusted by periodic monitoring of thyroid  function tests.  -There is no utility of suppressive therapy nor radioactive iodine therapy in her case. - I have encouraged her to keep her appointments for the CT scan and oncology appointment coming up.  - Her surgical cytology revealed locally invasive, possibly distant metastatic medullary thyroid   cancer-involving at least 2 lymph nodes in the neck.  She is status post total thyroidectomy with limited neck dissection.  She was  subsequently worked up for staging imaging as well as genetic testing.  Genetic test is not showing hereditary cause.    She was previously seen by ENT surgeon Dr. Nonda Loader. CT neck/thorax did not reveal an identifiable surgical lesion.  Left thoracic wall lesion was described as possible epidermal inclusion  cyst.   She has not undergone any additional surgery chemotherapy yet.  Her most recent calcitonin remains high at 95 on August 19, 2023 Slightly dropping from 110 in August 2024.  Send labs showed CEA of 4.1  Imaging with CT and MRI showed more lesions in the kidneys, currently undergoing evaluation by urology.   I have advised her to maintain her low-dose potassium supplement with the 20 mEq p.o. twice daily. She is advised to maintain close follow-up with all of her providers including oncology and urology.   - she is advised to maintain close follow up with Theresa Lucie PARAS, Theresa Buchanan for primary care needs.   I spent  25  minutes in the care of the patient today including review of labs from Thyroid  Function, CMP, and other relevant labs ; imaging/biopsy records (current and previous including abstractions from other facilities); face-to-face time discussing  her lab results and symptoms, medications doses, her options of short and long term treatment based on the latest standards of care / guidelines;   and documenting the encounter.  Theresa Buchanan  participated in the discussions, expressed understanding, and voiced agreement with the above plans.  All questions were answered to her satisfaction. she is encouraged to contact clinic should she have any questions or concerns prior to her return visit.   Follow up plan: Return in about 4 months (around 06/11/2024) for F/U with Pre-visit Labs.   Ranny Earl, MD [facility] [address] [place], [place] [address] Phone: [phone]  Fax: [phone]     02/10/2024, 5:53 PM  This note was partially dictated with voice recognition software. Similar sounding words can be transcribed inadequately or may not  be corrected upon review.

## 2024-02-17 ENCOUNTER — Other Ambulatory Visit: Payer: Self-pay | Admitting: *Deleted

## 2024-03-02 ENCOUNTER — Inpatient Hospital Stay: Attending: Oncology

## 2024-03-02 ENCOUNTER — Other Ambulatory Visit: Payer: Self-pay | Admitting: Oncology

## 2024-03-02 DIAGNOSIS — N281 Cyst of kidney, acquired: Secondary | ICD-10-CM | POA: Diagnosis not present

## 2024-03-02 DIAGNOSIS — R978 Other abnormal tumor markers: Secondary | ICD-10-CM | POA: Diagnosis not present

## 2024-03-02 DIAGNOSIS — C73 Malignant neoplasm of thyroid gland: Secondary | ICD-10-CM | POA: Insufficient documentation

## 2024-03-02 DIAGNOSIS — E890 Postprocedural hypothyroidism: Secondary | ICD-10-CM | POA: Insufficient documentation

## 2024-03-02 DIAGNOSIS — Z79890 Hormone replacement therapy (postmenopausal): Secondary | ICD-10-CM | POA: Diagnosis not present

## 2024-03-02 DIAGNOSIS — N2889 Other specified disorders of kidney and ureter: Secondary | ICD-10-CM

## 2024-03-02 LAB — CBC WITH DIFFERENTIAL/PLATELET
Abs Immature Granulocytes: 0.01 K/uL (ref 0.00–0.07)
Basophils Absolute: 0.1 K/uL (ref 0.0–0.1)
Basophils Relative: 1 %
Eosinophils Absolute: 0.1 K/uL (ref 0.0–0.5)
Eosinophils Relative: 2 %
HCT: 38.4 % (ref 36.0–46.0)
Hemoglobin: 12.9 g/dL (ref 12.0–15.0)
Immature Granulocytes: 0 %
Lymphocytes Relative: 21 %
Lymphs Abs: 1.2 K/uL (ref 0.7–4.0)
MCH: 27.4 pg (ref 26.0–34.0)
MCHC: 33.6 g/dL (ref 30.0–36.0)
MCV: 81.7 fL (ref 80.0–100.0)
Monocytes Absolute: 0.6 K/uL (ref 0.1–1.0)
Monocytes Relative: 10 %
Neutro Abs: 3.8 K/uL (ref 1.7–7.7)
Neutrophils Relative %: 66 %
Platelets: 344 K/uL (ref 150–400)
RBC: 4.7 MIL/uL (ref 3.87–5.11)
RDW: 14.8 % (ref 11.5–15.5)
WBC: 5.8 K/uL (ref 4.0–10.5)
nRBC: 0 % (ref 0.0–0.2)

## 2024-03-02 LAB — COMPREHENSIVE METABOLIC PANEL WITH GFR
ALT: 11 U/L (ref 0–44)
AST: 15 U/L (ref 15–41)
Albumin: 4 g/dL (ref 3.5–5.0)
Alkaline Phosphatase: 49 U/L (ref 38–126)
Anion gap: 15 (ref 5–15)
BUN: 21 mg/dL (ref 8–23)
CO2: 27 mmol/L (ref 22–32)
Calcium: 9.8 mg/dL (ref 8.9–10.3)
Chloride: 98 mmol/L (ref 98–111)
Creatinine, Ser: 1.18 mg/dL — ABNORMAL HIGH (ref 0.44–1.00)
GFR, Estimated: 48 mL/min — ABNORMAL LOW (ref >60)
Glucose, Bld: 104 mg/dL — ABNORMAL HIGH (ref 70–99)
Potassium: 3.1 mmol/L — ABNORMAL LOW (ref 3.5–5.1)
Sodium: 140 mmol/L (ref 135–145)
Total Bilirubin: 0.6 mg/dL (ref 0.0–1.2)
Total Protein: 7.7 g/dL (ref 6.5–8.1)

## 2024-03-02 LAB — IRON AND TIBC
Iron: 55 ug/dL (ref 28–170)
Saturation Ratios: 16 % (ref 10.4–31.8)
TIBC: 350 ug/dL (ref 250–450)
UIBC: 295 ug/dL

## 2024-03-02 LAB — FERRITIN: Ferritin: 113 ng/mL (ref 11–307)

## 2024-03-04 LAB — CALCITONIN: Calcitonin: 110 pg/mL — ABNORMAL HIGH (ref 0.0–5.0)

## 2024-03-05 LAB — CEA: CEA: 4.1 ng/mL (ref 0.0–4.7)

## 2024-03-09 ENCOUNTER — Inpatient Hospital Stay: Admitting: Oncology

## 2024-03-09 VITALS — BP 128/66 | HR 75 | Temp 98.1°F | Resp 18 | Wt 149.2 lb

## 2024-03-09 DIAGNOSIS — E039 Hypothyroidism, unspecified: Secondary | ICD-10-CM | POA: Diagnosis not present

## 2024-03-09 DIAGNOSIS — N289 Disorder of kidney and ureter, unspecified: Secondary | ICD-10-CM | POA: Diagnosis not present

## 2024-03-09 DIAGNOSIS — R978 Other abnormal tumor markers: Secondary | ICD-10-CM | POA: Diagnosis not present

## 2024-03-09 DIAGNOSIS — C73 Malignant neoplasm of thyroid gland: Secondary | ICD-10-CM

## 2024-03-09 DIAGNOSIS — E890 Postprocedural hypothyroidism: Secondary | ICD-10-CM | POA: Diagnosis not present

## 2024-03-09 DIAGNOSIS — N281 Cyst of kidney, acquired: Secondary | ICD-10-CM | POA: Diagnosis not present

## 2024-03-09 DIAGNOSIS — Z79890 Hormone replacement therapy (postmenopausal): Secondary | ICD-10-CM | POA: Diagnosis not present

## 2024-03-09 NOTE — Patient Instructions (Addendum)
 [facility] at [facility] Discharge Instructions   You were seen and examined today by Dr. Davonna.  She reviewed the results of your lab work which are normal/stable.   We will see you back in 3 months.   Return as scheduled.    Thank you for choosing [facility] at [facility] to provide your oncology and hematology care.  To afford each patient quality time with our provider, please arrive at least 15 minutes before your scheduled appointment time.   If you have a lab appointment with the Cancer Center please come in thru the Main Entrance and check in at the main information desk.  You need to re-schedule your appointment should you arrive 10 or more minutes late.  We strive to give you quality time with our providers, and arriving late affects you and other patients whose appointments are after yours.  Also, if you no show three or more times for appointments you may be dismissed from the clinic at the providers discretion.     Again, thank you for choosing [facility].  Our hope is that these requests will decrease the amount of time that you wait before being seen by our physicians.       _____________________________________________________________  Should you have questions after your visit to [facility], please contact our office at [phone] and follow the prompts.  Our office hours are 8:00 a.m. and 4:30 p.m. Monday - Friday.  Please note that voicemails left after 4:00 p.m. may not be returned until the following business day.  We are closed weekends and major holidays.  You do have access to a nurse 24-7, just call the main number to the clinic [phone] and do not press any options, hold on the line and a nurse will answer the phone.    For prescription refill requests, have your pharmacy contact our office and allow 72 hours.    Due to Covid, you will need to wear a mask upon entering the  hospital. If you do not have a mask, a mask will be given to you at the Main Entrance upon arrival. For doctor visits, patients may have 1 support person age 66 or older with them. For treatment visits, patients can not have anyone with them due to social distancing guidelines and our immunocompromised population.

## 2024-03-09 NOTE — Progress Notes (Signed)
 Patient Care Team: Jackson, Samantha J, PA-C as PCP - General (Family Medicine)  Clinic Day:  03/20/2024  Referring physician: Leonce Lucie PARAS, PA*   CHIEF COMPLAINT:  CC: pT2, pN1a medullary thyroid  carcinoma  Marzetta PARAS Radar 78 y.o. female was transferred to my care after her prior physician has left.   ASSESSMENT & PLAN:   Assessment & Plan: SHADOE CRYAN  is a 78 y.o. female with medullary thyroid  carcinoma  Assessment and Plan    Medullary thyroid  carcinoma S/p total thyroidectomy.  Oncology history below. Calcitonin slowly trending up but CEA normal. Patient's insurance has been not authorizing CT neck per documentation  -Will attempt to obtain a CT neck if calcitonin continues to rise - We reviewed labs today.  Calcitonin: 110, CEA: 4.1-similar to prior.  TSH: 54, T4: 1.27  Return to clinic in 3 months with labs   Right renal lesion under surveillance Right renal lesion managed conservatively with periodic imaging. - Continue surveillance with periodic imaging as directed by Dr. Sherrilee. - Ensure follow-up appointment with Dr. Sherrilee on April 11, 2024.   Hypothyroidism Secondary to thyroidectomy  - Being managed by Dr. Lenis     The patient understands the plans discussed today and is in agreement with them.  She knows to contact our office if she develops concerns prior to her next appointment.  40 minutes of total time was spent for this patient encounter, including preparation,review of records,  face-to-face counseling with the patient and coordination of care, physical exam, and documentation of the encounter.    LILLETTE Verneta SAUNDERS Teague,acting as a Neurosurgeon for Mickiel Dry, MD.,have documented all relevant documentation on the behalf of Mickiel Dry, MD,as directed by  Mickiel Dry, MD while in the presence of Mickiel Dry, MD.  I, Mickiel Dry MD, have reviewed the above documentation for accuracy and completeness, and I agree with the  above.     Mickiel Dry, MD  [facility] [place] - A DEPT OF [facility] [address] [place] [address] Dept: [phone] Dept Fax: [phone]   Orders Placed This Encounter  Procedures   CBC with Differential    Standing Status:   Future    Expected Date:   06/01/2024    Expiration Date:   08/30/2024   Comprehensive metabolic panel    Standing Status:   Future    Expected Date:   06/01/2024    Expiration Date:   08/30/2024   Calcitonin    Standing Status:   Future    Expected Date:   06/01/2024    Expiration Date:   03/09/2025   TSH    Standing Status:   Future    Expected Date:   06/01/2024    Expiration Date:   03/09/2025   CEA    Standing Status:   Future    Expected Date:   06/01/2024    Expiration Date:   03/09/2025   T4, free    Standing Status:   Future    Expected Date:   06/01/2024    Expiration Date:   03/09/2025     ONCOLOGY HISTORY:   I have reviewed her chart and materials related to her cancer extensively and collaborated history with the patient. Summary of oncologic history is as follows:   Diagnosis: pT2, pN1a medullary thyroid  carcinoma  -History of RAI induced hypothyroidism since early 2000's per documentation -08/06/2020: US  thyroid : Nodule 1 (TI-RADS 4) located in the  mid right thyroid  lobe measuring 2.1 x 1.8 x 1.4 cm meets criteria for FNA. Nodule 2 (TI-RADS 4) located in the upper left thyroid  lobe measuring 1.2 x 1.0 x 0.8 cm meets criteria for imaging follow-up. -12/12/2020: FNA of right multinodular goiter.   Cytology: Malignant cells present (Bethesda category VI)  -03/26/2021: Total thyroidectomy with limited central compartment lymph node dissection.  Pathology: Medullary thyroid  carcinoma, 3.1 cm. Tumor confined within the thyroid  capsule. Margins not involved. One perithyroidal lymph node negative for metastatic carcinoma (0/1). One right paratracheal lymph node positive  for metastatic carcinoma (1/1). Metastatic medullary carcinoma in one of four lymph nodes (1/4).  -04/03/2021: Calcitonin 136.0. CEA 62.9 -04/09/2021: CT CAP: Small RIGHT hepatic lobe lesions, indeterminate. In the setting of medullary thyroid  cancer MRI of the abdomen with and without contrast is suggested for further evaluation. At least 4 RIGHT renal lesions suspicious for neoplasm. Findings of pulmonary hamartoma in the RIGHT lower lobe measuring approximately 3 cm. Potential subcutaneous lesion at the LEFT thoracic inlet versus obey shows cysts, correlate with direct clinical inspection. -04/22/2021: Bone scan: Punctate area of increased radiopharmaceutical uptake noted projected over the right posterior twelfth rib. No other bony abnormalities identified.  -04/25/2021: NGS: BRAF negative, RET fusion not detected.  MSI-stable.  MMR-proficient.  NTRK 1/2/3 not detected.  TMB low.  PD-L1 is negative.  HRAS pathogenic variant exon 3.  -04/26/2021: MRI abdomen: Lower pole right renal lesion is favored to represent a cystic renal cell carcinoma. Bosniak IV (2019). An adjacent area of more central precontrast T1 hyperintensity may represent a separate hemorrhagic cyst or less likely be part of the exophytic lesion. Central upper pole right renal lesion is favored to represent a complex renal sinus cyst but is indeterminate. No suspicious liver lesion. No evidence of abdominal metastatic disease. -04/15/2021: Plasma Metanephrines: normal  -04/17/2021: 24 hr urine Metanephrines: normal  -02/28/2021: Patient evaluated by Dr. Eletha kansky surgery] for elevated calcitonin, who referred patient to Dr. Jesus [ENT] for surgical evaluation -10/29/2021: Patient seen by Dr. Jesus and felt not to be a surgical candidate (no surgical need) -2023-2025: Calcitonin continues to remain elevated ranging from 136.0 to 86.7, while CEA normalized in 2023 ranging from 4.6 to 4.0 -2023-2024: CT soft tissue necks show  NED   Diagnosis: Right kidney lesion  -04/26/2021: MRI abdomen: Lower pole right renal lesion is favored to represent a cystic renal cell carcinoma. Bosniak IV (2019). An adjacent area of more central precontrast T1 hyperintensity may represent a separate hemorrhagic cyst or less likely be part of the exophytic lesion. Central upper pole right renal lesion is favored to represent a complex renal sinus cyst but is indeterminate. -09/16/2021: CT Chest: An indeterminate right renal lesion is grossly stable, incompletely visualized. -10/03/2022: MRI abdomen: Contrast enhancing complex mixed solid and cystic lesion of the medial superior pole and sinus of the right kidney measuring 2.3 x 1.6 cm. Heterogeneously enhancing predominantly solid mass of the medial inferior pole of the right kidney measuring 1.9 x 1.5 cm. Additional heterogeneously enhancing partially exophytic mixed solid and cystic lesion inferiorly in the medial inferior pole of the right kidney measuring 1.9 x 1.5 cm. All three lesions above are consistent with renal cell carcinoma (Bosniak category IV). No evidence of renal vein invasion, lymphadenopathy or metastatic disease in the abdomen. -07/09/2023: MRI abdomen:  Multiple heterogeneously enhancing mixed solid and cystic masses of the right kidney are unchanged, of the medial superior pole of the right kidney and right renal sinus measuring  2.4 x 1.7 cm, as well as two closely adjacent lesions of the medial inferior pole of the right kidney measuring 1.5 x 1.4 cm and 1.9 x 1.4 cm. There is an additional tiny heterogeneously enhancing mass of the lateral midportion of the right kidney which was not previously noted but not significantly changed, measuring 0.5 cm. Findings are consistent with multifocal renal cell carcinoma. No evidence of renal vein invasion, lymphadenopathy or metastatic disease in the abdomen. -Patient is followed by Dr. Sherrilee [urology] for this   Current Treatment:   Surveillance  INTERVAL HISTORY:  Discussed the use of AI scribe software for clinical note transcription with the patient, who gave verbal consent to proceed.  History of Present Illness   SORAIDA VICKERS is a 78 year old female who presents for follow-up and establish care with me for medullary thyroid  carcinoma  Her hypothyroidism is being managed by Doctor Nida. Her calcitonin level has increased from 95 to 110. She sometimes experiences palpitations and fatigue.  She is under the care of Doctor McKenziefor her renal lesion.  I have reviewed the past medical history, past surgical history, social history and family history with the patient and they are unchanged from previous note.  ALLERGIES:  has no known allergies.  MEDICATIONS:  Current Outpatient Medications  Medication Sig Dispense Refill   aspirin EC 81 MG tablet Take 81 mg by mouth 2 (two) times a week.     calcium  carbonate (TUMS) 500 MG chewable tablet Chew 2 tablets (400 mg of elemental calcium  total) by mouth 2 (two) times daily. 90 tablet 1   Camphor-Menthol-Methyl Sal (SALONPAS) 3.06-21-08 % PTCH Place 1 application onto the skin daily as needed (pain).     levothyroxine  (SYNTHROID ) 125 MCG tablet TAKE 1/2 (ONE-HALF) TABLET BY MOUTH BEFORE BREAKFAST 45 tablet 0   liothyronine  (CYTOMEL ) 5 MCG tablet Take 1 tablet (5 mcg total) by mouth daily. 90 tablet 1   lisinopril -hydrochlorothiazide  (ZESTORETIC ) 20-25 MG tablet Take 1 tablet by mouth daily.     Polyethyl Glycol-Propyl Glycol (SYSTANE OP) Place 1 drop into both eyes 2 (two) times daily as needed (dry eyes).     potassium chloride  SA (KLOR-CON  M) 20 MEQ tablet Take 1 tablet (20 mEq total) by mouth 2 (two) times daily. 180 tablet 0   traMADol  (ULTRAM ) 50 MG tablet Take 50 mg by mouth every 6 (six) hours as needed.     trolamine salicylate (ASPERCREME) 10 % cream Apply 1 application topically as needed for muscle pain.     No current facility-administered medications for  this visit.    REVIEW OF SYSTEMS:   Constitutional: Denies fevers, chills or abnormal weight loss Eyes: Denies blurriness of vision Ears, nose, mouth, throat, and face: Denies mucositis or sore throat Respiratory: Denies cough, dyspnea or wheezes Cardiovascular: Palpitation, chest discomfort or lower extremity swelling Gastrointestinal:  Denies nausea, heartburn or change in bowel habits Skin: Denies abnormal skin rashes Lymphatics: Denies new lymphadenopathy or easy bruising Neurological:Denies numbness, tingling or new weaknesses Behavioral/Psych: Mood is stable, no new changes  All other systems were reviewed with the patient and are negative.   VITALS:  Blood pressure 128/66, pulse 75, temperature 98.1 F (36.7 C), temperature source Oral, resp. rate 18, weight 149 lb 3.2 oz (67.7 kg), SpO2 100%.  Wt Readings from Last 3 Encounters:  03/09/24 149 lb 3.2 oz (67.7 kg)  02/10/24 150 lb 12.8 oz (68.4 kg)  08/26/23 159 lb 2.8 oz (72.2 kg)    Body  mass index is 24.83 kg/m.  Performance status (ECOG): 0 - Asymptomatic  PHYSICAL EXAM:   GENERAL:alert, no distress and comfortable SKIN: skin color, texture, turgor are normal, no rashes or significant lesions NECK: supple, thyroid  normal size, non-tender, without nodularity LYMPH:  no palpable lymphadenopathy in the cervical, axillary or inguinal LUNGS: clear to auscultation and percussion with normal breathing effort HEART: regular rate & rhythm and no murmurs and no lower extremity edema ABDOMEN:abdomen soft, non-tender and normal bowel sounds Musculoskeletal:no cyanosis of digits and no clubbing  NEURO: alert & oriented x 3 with fluent speech, no focal motor/sensory deficits  LABORATORY DATA:  I have reviewed the data as listed   Lab Results  Component Value Date   WBC 5.8 03/02/2024   NEUTROABS 3.8 03/02/2024   HGB 12.9 03/02/2024   HCT 38.4 03/02/2024   MCV 81.7 03/02/2024   PLT 344 03/02/2024      Chemistry       Component Value Date/Time   NA 140 03/02/2024 1057   NA 143 08/05/2023 0928   K 3.1 (L) 03/02/2024 1057   CL 98 03/02/2024 1057   CO2 27 03/02/2024 1057   BUN 21 03/02/2024 1057   BUN 17 08/05/2023 0928   CREATININE 1.18 (H) 03/02/2024 1057      Component Value Date/Time   CALCIUM  9.8 03/02/2024 1057   ALKPHOS 49 03/02/2024 1057   AST 15 03/02/2024 1057   ALT 11 03/02/2024 1057   BILITOT 0.6 03/02/2024 1057   BILITOT 0.4 08/05/2023 0928       Latest Reference Range & Units 03/02/24 10:57  Calcitonin 0.0 - 5.0 pg/mL 110.0 (H)  CEA 0.0 - 4.7 ng/mL 4.1  (H): Data is abnormally high   Latest Reference Range & Units 02/01/24 11:02  TSH 0.450 - 4.500 uIU/mL 54.300 (H)  T4,Free(Direct) 0.82 - 1.77 ng/dL 8.72  (H): Data is abnormally high  RADIOGRAPHIC STUDIES: I have personally reviewed the radiological images as listed and agreed with the findings in the report.  Ultrasound renal complete EXAM: RENAL / URINARY TRACT ULTRASOUND COMPLETE  COMPARISON:  None Available.  FINDINGS: Right Kidney:  Length: 9.7 cm. Echogenicity within normal limits. Parapelvic cyst measures 2 cm. The lesion is Bosniak type 3 with nodular solid-appearing component and septations. Midpole cyst measures 1.3 cm. Upper pole cyst measures 1.6 cm. No hydronephrosis.  Left Kidney:  Length: 9.7 cm. Echogenicity within normal limits. No mass or hydronephrosis visualized.  Bladder:  Appears normal for degree of bladder distention.  IMPRESSION: 2 cm Bosniak type 3 lesion in the right kidney. Recommend further evaluation with pre and post contrast enhanced CT or MRI.  Electronically Signed   By: Fonda Field M.D.   On: 12/21/2023 23:46

## 2024-04-11 ENCOUNTER — Ambulatory Visit: Admitting: Urology

## 2024-04-11 ENCOUNTER — Encounter: Payer: Self-pay | Admitting: Urology

## 2024-04-11 VITALS — BP 148/74 | HR 87

## 2024-04-11 DIAGNOSIS — N289 Disorder of kidney and ureter, unspecified: Secondary | ICD-10-CM | POA: Diagnosis not present

## 2024-04-11 LAB — URINALYSIS, ROUTINE W REFLEX MICROSCOPIC
Bilirubin, UA: NEGATIVE
Glucose, UA: NEGATIVE
Ketones, UA: NEGATIVE
Leukocytes,UA: NEGATIVE
Nitrite, UA: NEGATIVE
Protein,UA: NEGATIVE
Specific Gravity, UA: 1.015 (ref 1.005–1.030)
Urobilinogen, Ur: 1 mg/dL (ref 0.2–1.0)
pH, UA: 6 (ref 5.0–7.5)

## 2024-04-11 LAB — MICROSCOPIC EXAMINATION: Epithelial Cells (non renal): >10 /HPF — AB (ref 0–10)

## 2024-04-11 NOTE — Patient Instructions (Signed)
 A Growth in the Kidney (Renal Mass): What to Know  A renal mass is an abnormal growth in the kidney. It may be found during an MRI, CT scan, or ultrasound that's done to check for other problems in the belly. Some renal masses are cancerous, or malignant, and can grow or spread quickly. Others are benign, which means they're not cancer. Renal masses include: Tumors. These may be either cancerous or benign. The most common cancerous tumor in adults is called renal cell carcinoma. In children, the most common type is Wilms tumor. The most common kidney tumors that aren't cancer include renal adenomas, oncocytomas, and angiomyolipomas (AML). Cysts. These are pockets of fluid that form on or in the kidney. What are the causes? Certain types of cancers, infections, or injuries can cause a renal mass. It's not always known what causes a cyst to form in or on the kidney. What are the signs or symptoms? Often, a renal mass or kidney cyst doesn't cause any signs or symptoms. How is this diagnosed? Your health care provider may suggest tests to diagnose the cause of your renal mass. These tests may include: A physical exam. Blood tests. Pee (urine) tests. Imaging tests, such as: CT scan. MRI. Ultrasound. Chest X-ray or bone scan. These may be done if a tumor is cancerous to see if the cancer has spread outside the kidney. Biopsy. This is when a small piece of tissue is removed from the renal mass for testing. How is this treated? Treatment is not always needed for a renal mass. Treatment will depend on the cause of the mass and if it's causing any problems or symptoms. For a cancerous renal mass, treatment options may include: Surgery. This is done to remove the tumor and any affected tissue. Chemotherapy. This uses medicines to kill cancer cells. Radiation. High-energy X-rays or gamma rays are used to kill cancer cells. Ablation. This uses extreme hot or cold temperature to kill the cancer  cells. Immunotherapy. Medicines are used to help the body's defense system (immune system) fight the cancer cells. Taking part in clinical trials. This involves trying new or experimental treatments to see if they're effective. Most kidney cysts don't need to be treated. Follow these instructions at home: What you need to do at home will depend on the cause of the mass. Follow the instructions that your provider gives you. In general: Take medicines only as told. If you were given antibiotics, take them as told. Do not stop taking them even if you start to feel better. Follow any instructions from your provider about what you can and can't do. Do not smoke, vape, or use nicotine or tobacco. Keep all follow-up visits. Your provider will need to check if your renal mass has changed or grown. Contact a health care provider if: You have flank pain, which is pain in your side or back. You have a fever. You have a loss of appetite. You have pain or swelling in your belly. You lose weight. Get help right away if: Your pain gets worse. There's blood in your pee. You can't pee. This information is not intended to replace advice given to you by your health care provider. Make sure you discuss any questions you have with your health care provider. Document Revised: 11/28/2022 Document Reviewed: 11/28/2022 Elsevier Patient Education  2025 [employer].

## 2024-04-11 NOTE — Progress Notes (Unsigned)
 04/11/2024 1:42 PM   Theresa Buchanan [birth year] [id number]  Referring provider: Leonce Lucie JINNY, PA-C [address] [place],  [place] [address]  No chief complaint on file.   HPI: Renal US  12/21/23 shows 2cm right Bosniak 3 cyst which is stable in size   PMH: Past Medical History:  Diagnosis Date   Family history of cancer    Hypertension    Thyroid  disease     Surgical History: Past Surgical History:  Procedure Laterality Date   ABDOMINAL HYSTERECTOMY     BREAST BIOPSY Left 05/18/2019   Intraductal papilloma   COLON SURGERY     THYROIDECTOMY N/A 03/26/2021   Procedure: TOTAL THYROIDECTOMY WITH LIMITED LYMPH NODE DISSECTION;  Surgeon: Eletha Boas, MD;  Location: [facility];  Service: General;  Laterality: N/A;   TUBAL LIGATION      Home Medications:  Allergies as of 04/11/2024   No Known Allergies      Medication List        Accurate as of April 11, 2024  1:42 PM. If you have any questions, ask your nurse or doctor.          aspirin EC 81 MG tablet Take 81 mg by mouth 2 (two) times a week.   calcium  carbonate 500 MG chewable tablet Commonly known as: Tums Chew 2 tablets (400 mg of elemental calcium  total) by mouth 2 (two) times daily.   levothyroxine  125 MCG tablet Commonly known as: SYNTHROID  TAKE 1/2 (ONE-HALF) TABLET BY MOUTH BEFORE BREAKFAST   liothyronine  5 MCG tablet Commonly known as: Cytomel  Take 1 tablet (5 mcg total) by mouth daily.   lisinopril -hydrochlorothiazide  20-25 MG tablet Commonly known as: ZESTORETIC  Take 1 tablet by mouth daily.   potassium chloride  SA 20 MEQ tablet Commonly known as: KLOR-CON  M Take 1 tablet (20 mEq total) by mouth 2 (two) times daily.   Salonpas 3.06-21-08 % Ptch Generic drug: Camphor-Menthol-Methyl Sal Place 1 application onto the skin daily as needed (pain).   SYSTANE OP Place 1 drop into both eyes 2 (two) times daily as needed (dry eyes).   traMADol  50 MG tablet Commonly known as: ULTRAM  Take  50 mg by mouth every 6 (six) hours as needed.   trolamine salicylate 10 % cream Commonly known as: ASPERCREME Apply 1 application topically as needed for muscle pain.        Allergies: No Known Allergies  Family History: Family History  Problem Relation Age of Onset   Hypertension Mother    Thyroid  disease Mother    Diabetes Mother    Cancer Maternal Aunt        unk types   Cancer Maternal Uncle        unk types    Social History:  reports that she has never smoked. She has never used smokeless tobacco. She reports that she does not drink alcohol and does not use drugs.  ROS: All other review of systems were reviewed and are negative except what is noted above in HPI  Physical Exam: BP (!) 148/74   Pulse 87   Constitutional:  Alert and oriented, No acute distress. HEENT: [place] AT, moist mucus membranes.  Trachea midline, no masses. Cardiovascular: No clubbing, cyanosis, or edema. Respiratory: Normal respiratory effort, no increased work of breathing. GI: Abdomen is soft, nontender, nondistended, no abdominal masses GU: No CVA tenderness.  Lymph: No cervical or inguinal lymphadenopathy. Skin: No rashes, bruises or suspicious lesions. Neurologic: Grossly intact, no focal deficits, moving all  4 extremities. Psychiatric: Normal mood and affect.  Laboratory Data: Lab Results  Component Value Date   WBC 5.8 03/02/2024   HGB 12.9 03/02/2024   HCT 38.4 03/02/2024   MCV 81.7 03/02/2024   PLT 344 03/02/2024    Lab Results  Component Value Date   CREATININE 1.18 (H) 03/02/2024    No results found for: PSA  No results found for: TESTOSTERONE  No results found for: HGBA1C  Urinalysis    Component Value Date/Time   APPEARANCEUR Clear 11/07/2022 0853   GLUCOSEU Negative 11/07/2022 0853   BILIRUBINUR Negative 11/07/2022 0853   PROTEINUR Negative 11/07/2022 0853   NITRITE Negative 11/07/2022 0853   LEUKOCYTESUR Trace (A) 11/07/2022 0853    Lab Results   Component Value Date   LABMICR See below: 11/07/2022   WBCUA 0-5 11/07/2022   LABEPIT >10 (A) 11/07/2022   BACTERIA Few (A) 11/07/2022    Pertinent Imaging: *** No results found for this or any previous visit.  No results found for this or any previous visit.  No results found for this or any previous visit.  No results found for this or any previous visit.  Results for orders placed during the hospital encounter of 12/21/23  Ultrasound renal complete  Narrative EXAM: RENAL / URINARY TRACT ULTRASOUND COMPLETE  COMPARISON:  None Available.  FINDINGS: Right Kidney:  Length: 9.7 cm. Echogenicity within normal limits. Parapelvic cyst measures 2 cm. The lesion is Bosniak type 3 with nodular solid-appearing component and septations. Midpole cyst measures 1.3 cm. Upper pole cyst measures 1.6 cm. No hydronephrosis.  Left Kidney:  Length: 9.7 cm. Echogenicity within normal limits. No mass or hydronephrosis visualized.  Bladder:  Appears normal for degree of bladder distention.  IMPRESSION: 2 cm Bosniak type 3 lesion in the right kidney. Recommend further evaluation with pre and post contrast enhanced CT or MRI.   Electronically Signed By: Fonda Field M.D. On: 12/21/2023 23:46  No results found for this or any previous visit.  No results found for this or any previous visit.  No results found for this or any previous visit.   Assessment & Plan:    1. Kidney lesion (Primary) Continue surveillance. Followup 6 months with a renal US  - Urinalysis, Routine w reflex microscopic   No follow-ups on file.  Belvie Clara, MD  [facility] [place]

## 2024-05-03 ENCOUNTER — Other Ambulatory Visit: Payer: Self-pay | Admitting: "Endocrinology

## 2024-06-02 DIAGNOSIS — E890 Postprocedural hypothyroidism: Secondary | ICD-10-CM | POA: Diagnosis not present

## 2024-06-03 LAB — TSH: TSH: 18.9 u[IU]/mL — ABNORMAL HIGH (ref 0.450–4.500)

## 2024-06-03 LAB — T4, FREE: Free T4: 1.6 ng/dL (ref 0.82–1.77)

## 2024-06-06 ENCOUNTER — Inpatient Hospital Stay: Attending: Hematology

## 2024-06-06 DIAGNOSIS — R978 Other abnormal tumor markers: Secondary | ICD-10-CM | POA: Diagnosis not present

## 2024-06-06 DIAGNOSIS — E890 Postprocedural hypothyroidism: Secondary | ICD-10-CM | POA: Diagnosis not present

## 2024-06-06 DIAGNOSIS — Z79899 Other long term (current) drug therapy: Secondary | ICD-10-CM | POA: Insufficient documentation

## 2024-06-06 DIAGNOSIS — E876 Hypokalemia: Secondary | ICD-10-CM | POA: Insufficient documentation

## 2024-06-06 DIAGNOSIS — R630 Anorexia: Secondary | ICD-10-CM | POA: Diagnosis not present

## 2024-06-06 DIAGNOSIS — R718 Other abnormality of red blood cells: Secondary | ICD-10-CM | POA: Diagnosis not present

## 2024-06-06 DIAGNOSIS — Z85850 Personal history of malignant neoplasm of thyroid: Secondary | ICD-10-CM | POA: Insufficient documentation

## 2024-06-06 DIAGNOSIS — N2889 Other specified disorders of kidney and ureter: Secondary | ICD-10-CM | POA: Diagnosis present

## 2024-06-06 DIAGNOSIS — C73 Malignant neoplasm of thyroid gland: Secondary | ICD-10-CM

## 2024-06-06 LAB — CBC WITH DIFFERENTIAL/PLATELET
Abs Immature Granulocytes: 0.01 K/uL (ref 0.00–0.07)
Basophils Absolute: 0.1 K/uL (ref 0.0–0.1)
Basophils Relative: 1 %
Eosinophils Absolute: 0.2 K/uL (ref 0.0–0.5)
Eosinophils Relative: 3 %
HCT: 37.2 % (ref 36.0–46.0)
Hemoglobin: 12.5 g/dL (ref 12.0–15.0)
Immature Granulocytes: 0 %
Lymphocytes Relative: 24 %
Lymphs Abs: 1.4 K/uL (ref 0.7–4.0)
MCH: 26.8 pg (ref 26.0–34.0)
MCHC: 33.6 g/dL (ref 30.0–36.0)
MCV: 79.8 fL — ABNORMAL LOW (ref 80.0–100.0)
Monocytes Absolute: 0.6 K/uL (ref 0.1–1.0)
Monocytes Relative: 10 %
Neutro Abs: 3.7 K/uL (ref 1.7–7.7)
Neutrophils Relative %: 62 %
Platelets: 340 K/uL (ref 150–400)
RBC: 4.66 MIL/uL (ref 3.87–5.11)
RDW: 15.1 % (ref 11.5–15.5)
WBC: 5.9 K/uL (ref 4.0–10.5)
nRBC: 0 % (ref 0.0–0.2)

## 2024-06-06 LAB — COMPREHENSIVE METABOLIC PANEL WITH GFR
ALT: 5 U/L (ref 0–44)
AST: 15 U/L (ref 15–41)
Albumin: 4.3 g/dL (ref 3.5–5.0)
Alkaline Phosphatase: 58 U/L (ref 38–126)
Anion gap: 15 (ref 5–15)
BUN: 23 mg/dL (ref 8–23)
CO2: 24 mmol/L (ref 22–32)
Calcium: 9.9 mg/dL (ref 8.9–10.3)
Chloride: 100 mmol/L (ref 98–111)
Creatinine, Ser: 1.05 mg/dL — ABNORMAL HIGH (ref 0.44–1.00)
GFR, Estimated: 55 mL/min — ABNORMAL LOW
Glucose, Bld: 118 mg/dL — ABNORMAL HIGH (ref 70–99)
Potassium: 3.2 mmol/L — ABNORMAL LOW (ref 3.5–5.1)
Sodium: 139 mmol/L (ref 135–145)
Total Bilirubin: 0.5 mg/dL (ref 0.0–1.2)
Total Protein: 7.5 g/dL (ref 6.5–8.1)

## 2024-06-06 LAB — T4, FREE: Free T4: 1.59 ng/dL (ref 0.80–2.00)

## 2024-06-06 LAB — TSH: TSH: 15.8 u[IU]/mL — ABNORMAL HIGH (ref 0.350–4.500)

## 2024-06-07 LAB — CEA: CEA: 4.2 ng/mL (ref 0.0–4.7)

## 2024-06-08 LAB — CALCITONIN: Calcitonin: 114 pg/mL — ABNORMAL HIGH (ref 0.0–5.0)

## 2024-06-13 ENCOUNTER — Ambulatory Visit: Admitting: "Endocrinology

## 2024-06-13 ENCOUNTER — Inpatient Hospital Stay: Admitting: Physician Assistant

## 2024-06-13 ENCOUNTER — Encounter: Payer: Self-pay | Admitting: "Endocrinology

## 2024-06-13 VITALS — BP 105/52 | HR 97 | Temp 98.4°F | Resp 19 | Wt 143.0 lb

## 2024-06-13 VITALS — BP 100/67 | HR 88 | Resp 18 | Ht 66.0 in | Wt 144.0 lb

## 2024-06-13 DIAGNOSIS — N289 Disorder of kidney and ureter, unspecified: Secondary | ICD-10-CM

## 2024-06-13 DIAGNOSIS — E890 Postprocedural hypothyroidism: Secondary | ICD-10-CM | POA: Diagnosis not present

## 2024-06-13 DIAGNOSIS — C73 Malignant neoplasm of thyroid gland: Secondary | ICD-10-CM

## 2024-06-13 DIAGNOSIS — N2889 Other specified disorders of kidney and ureter: Secondary | ICD-10-CM | POA: Diagnosis not present

## 2024-06-13 NOTE — Progress Notes (Signed)
 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 Nodule 2 (TI-RADS 4) located in the upper left thyroid  lobe measuring 1.2 x 1.0 x 0.8 cm meets criteria for imaging follow-up. 12/12/2020: FNA of right multinodular goiter. Cytology: Malignant cells present (Bethesda category VI)  03/26/2021: Total thyroidectomy with limited central compartment lymph node dissection.  Pathology: Medullary thyroid  carcinoma, 3.1 cm. Tumor confined within the thyroid  capsule. Margins not involved. One perithyroidal lymph node negative for metastatic carcinoma (0/1). One right paratracheal lymph node positive for metastatic carcinoma (1/1). Metastatic medullary carcinoma in one of four lymph nodes (1/4).  04/03/2021: Calcitonin 136.0. CEA 62.9 04/09/2021: CT CAP: Small RIGHT hepatic lobe lesions, indeterminate. In the setting of medullary thyroid  cancer MRI of the abdomen with and without contrast is suggested for further evaluation. At least 4 RIGHT renal lesions suspicious for neoplasm. Findings of pulmonary hamartoma in the RIGHT lower lobe measuring approximately 3 cm. Potential subcutaneous lesion at the LEFT thoracic inlet versus obey shows cysts, correlate with direct clinical inspection. 04/22/2021: Bone scan: Punctate area of increased radiopharmaceutical uptake noted projected over the right posterior twelfth rib. No other bony abnormalities identified.  04/25/2021: NGS: BRAF negative, RET fusion not detected.  MSI-stable.  MMR-proficient.  NTRK 1/2/3 not detected.  TMB low.   PD-L1 is negative.  HRAS pathogenic variant exon 3.  04/26/2021: MRI abdomen: Lower pole right renal lesion is favored to represent a cystic renal cell carcinoma. Bosniak IV (2019). An adjacent area of more central precontrast T1 hyperintensity may represent a separate hemorrhagic cyst or less likely be part of the exophytic lesion. Central upper pole right renal lesion is favored to represent a complex renal sinus cyst but is indeterminate. No suspicious liver lesion. No evidence of abdominal metastatic disease. 04/15/2021: Plasma Metanephrines: normal  04/17/2021: 24 hr urine Metanephrines: normal  02/28/2021: Patient evaluated by Dr. Eletha kansky surgery] for elevated calcitonin, who referred patient to Dr. Jesus [ENT] for surgical evaluation 10/29/2021: Patient seen by Dr. Jesus and felt not to be a surgical candidate (no surgical need) 2023-2025: Calcitonin continues to remain elevated ranging from 136.0 to 86.7, while CEA normalized in 2023 ranging from 4.6 to 4.0 2023-2024: CT soft tissue necks show NED   #Right kidney lesion 04/26/2021: MRI abdomen: Lower pole right renal lesion is favored to represent a cystic renal cell carcinoma. Bosniak IV (2019). An adjacent area of more central precontrast T1 hyperintensity may represent a separate hemorrhagic cyst or less likely be part of the exophytic lesion. Central upper pole right renal lesion is favored to represent a complex renal sinus cyst but is indeterminate. 09/16/2021: CT Chest: An indeterminate right renal lesion is grossly stable, incompletely visualized. 10/03/2022: MRI abdomen: Contrast enhancing complex mixed solid and cystic lesion of the medial superior pole and sinus of the right kidney measuring 2.3 x 1.6 cm. Heterogeneously enhancing predominantly solid mass of the medial inferior pole of the right kidney measuring 1.9 x 1.5 cm. Additional heterogeneously enhancing partially exophytic mixed solid and cystic lesion inferiorly in  the medial inferior pole of the right kidney measuring 1.9 x  1.5 cm. All three lesions above are consistent with renal cell carcinoma (Bosniak category IV). No evidence of renal vein invasion, lymphadenopathy or metastatic disease in the abdomen. 07/09/2023: MRI abdomen:  Multiple heterogeneously enhancing mixed solid and cystic masses of the right kidney are unchanged, of the medial superior pole of the right kidney and right renal sinus measuring 2.4 x 1.7 cm, as well as two closely adjacent lesions of the medial inferior pole of the right kidney measuring 1.5 x 1.4 cm and 1.9 x 1.4 cm. There is an additional tiny heterogeneously enhancing mass of the lateral midportion of the right kidney which was not previously noted but not significantly changed, measuring 0.5 cm. Findings are consistent with multifocal renal cell carcinoma. No evidence of renal vein invasion, lymphadenopathy or metastatic disease in the abdomen. 12/21/2023: US  renal: 2 cm Bosniak type 3 lesion in the right kidney Patient is followed by Dr. Sherrilee [urology] for this   CURRENT TREATMENT: Surveillance  INTERVAL HISTORY:  Theresa Buchanan 78 y.o. female returns for a surveillance visit for history of medullary thyroid  carcinoma and right renal mass. She was last seen by Dr. Davonna on 03/09/2024. She is unaccompanied for this visit.  On exam today, [name] reports her energy levels are fairly stable and she can complete her baseline ADLs on her own. She has noticed decreased appetite and lost close to 7 lbs since last visit. She denies nausea, vomiting or bowel habit changes. She denies easy bruising or overt signs of bleeding. She does have chronic posterior neck pain that radiates down her shoulders. She denies fevers, chills, sweats, shortness of breath, chest pain or cough. She has no other complaints. Rest of the ROS is below.   MEDICAL HISTORY:  Past Medical History:  Diagnosis Date   Family history of cancer    Hypertension     Thyroid  disease     SURGICAL HISTORY: Past Surgical History:  Procedure Laterality Date   ABDOMINAL HYSTERECTOMY     BREAST BIOPSY Left 05/18/2019   Intraductal papilloma   COLON SURGERY     THYROIDECTOMY N/A 03/26/2021   Procedure: TOTAL THYROIDECTOMY WITH LIMITED LYMPH NODE DISSECTION;  Surgeon: Eletha Boas, MD;  Location: [facility];  Service: General;  Laterality: N/A;   TUBAL LIGATION      SOCIAL HISTORY: Social History   Socioeconomic History   Marital status: Married    Spouse name: Not on file   Number of children: Not on file   Years of education: Not on file   Highest education level: Not on file  Occupational History   Not on file  Tobacco Use   Smoking status: Never   Smokeless tobacco: Never  Vaping Use   Vaping status: Never Used  Substance and Sexual Activity   Alcohol use: Never   Drug use: Never   Sexual activity: Not on file  Other Topics Concern   Not on file  Social History Narrative   Not on file   Social Drivers of Health   Tobacco Use: Low Risk (06/13/2024)   Patient History    Smoking Tobacco Use: Never    Smokeless Tobacco Use: Never    Passive Exposure: Not on file  Financial Resource Strain: Not on file  Food Insecurity: Not on file  Transportation Needs: Not on file  Physical Activity: Not on file  Stress: Not on file  Social Connections: Not on file  Intimate Partner Violence: Not on file  Depression (PHQ2-9): Low Risk (06/13/2024)  Depression (PHQ2-9)    PHQ-2 Score: 0  Alcohol Screen: Not on file  Housing: Not on file  Utilities: Not on file  Health Literacy: Not on file    FAMILY HISTORY: Family History  Problem Relation Age of Onset   Hypertension Mother    Thyroid  disease Mother    Diabetes Mother    Cancer Maternal Aunt        unk types   Cancer Maternal Uncle        unk types    ALLERGIES:  has no known allergies.  MEDICATIONS:  Current Outpatient Medications  Medication Sig Dispense Refill   aspirin  EC 81 MG tablet Take 81 mg by mouth 2 (two) times a week.     calcium  carbonate (TUMS) 500 MG chewable tablet Chew 2 tablets (400 mg of elemental calcium  total) by mouth 2 (two) times daily. 90 tablet 1   Camphor-Menthol-Methyl Sal (SALONPAS) 3.06-21-08 % PTCH Place 1 application onto the skin daily as needed (pain).     levothyroxine  (SYNTHROID ) 125 MCG tablet TAKE 1/2 (ONE-HALF) TABLET BY MOUTH BEFORE BREAKFAST 45 tablet 0   liothyronine  (CYTOMEL ) 5 MCG tablet Take 1 tablet (5 mcg total) by mouth daily. 90 tablet 1   lisinopril -hydrochlorothiazide  (ZESTORETIC ) 20-25 MG tablet Take 1 tablet by mouth daily.     Polyethyl Glycol-Propyl Glycol (SYSTANE OP) Place 1 drop into both eyes 2 (two) times daily as needed (dry eyes).     potassium chloride  SA (KLOR-CON  M) 20 MEQ tablet Take 1 tablet (20 mEq total) by mouth 2 (two) times daily. 180 tablet 0   traMADol  (ULTRAM ) 50 MG tablet Take 50 mg by mouth every 6 (six) hours as needed.     trolamine salicylate (ASPERCREME) 10 % cream Apply 1 application topically as needed for muscle pain.     No current facility-administered medications for this visit.    REVIEW OF SYSTEMS:   Constitutional: ( - ) fevers, ( - )  chills , ( - ) night sweats Eyes: ( - ) blurriness of vision, ( - ) double vision, ( - ) watery eyes Ears, nose, mouth, throat, and face: ( - ) mucositis, ( - ) sore throat Respiratory: ( - ) cough, ( - ) dyspnea, ( - ) wheezes Cardiovascular: ( - ) palpitation, ( - ) chest discomfort, ( - ) lower extremity swelling Gastrointestinal:  ( - ) nausea, ( - ) heartburn, ( - ) change in bowel habits Skin: ( - ) abnormal skin rashes Lymphatics: ( - ) new lymphadenopathy, ( - ) easy bruising Neurological: ( - ) numbness, ( - ) tingling, ( - ) new weaknesses Behavioral/Psych: ( - ) mood change, ( - ) new changes  All other systems were reviewed with the patient and are negative.  PHYSICAL EXAMINATION: ECOG PERFORMANCE STATUS: 1 - Symptomatic but  completely ambulatory  Vitals:   06/13/24 1421  BP: (!) 105/52  Pulse: 97  Resp: 19  Temp: 98.4 F (36.9 C)  SpO2: 99%   Filed Weights   06/13/24 1421  Weight: 143 lb (64.9 kg)    GENERAL: well appearing female in NAD  SKIN: skin color, texture, turgor are normal, no rashes or significant lesions EYES: conjunctiva are pink and non-injected, sclera clear OROPHARYNX: no exudate, no erythema; lips, buccal mucosa, and tongue normal  NECK: supple, non-tender LYMPH:  no palpable lymphadenopathy in the cervical or supraclavicular lymph nodes.  LUNGS: clear to auscultation and percussion with normal breathing effort HEART:  regular rate & rhythm and no murmurs and no lower extremity edema ABDOMEN: soft, non-tender, non-distended, normal bowel sounds Musculoskeletal: no cyanosis of digits and no clubbing  PSYCH: alert & oriented x 3, fluent speech NEURO: no focal motor/sensory deficits  LABORATORY DATA:  I have reviewed the data as listed    Latest Ref Rng & Units 06/06/2024    2:22 PM 03/02/2024   10:57 AM 08/19/2023    9:40 AM  CBC  WBC 4.0 - 10.5 K/uL 5.9  5.8  5.5   Hemoglobin 12.0 - 15.0 g/dL 87.4  87.0  88.3   Hematocrit 36.0 - 46.0 % 37.2  38.4  35.4   Platelets 150 - 400 K/uL 340  344  274        Latest Ref Rng & Units 06/06/2024    2:22 PM 03/02/2024   10:57 AM 08/19/2023    9:40 AM  CMP  Glucose 70 - 99 mg/dL 881  895  99   BUN 8 - 23 mg/dL 23  21  19    Creatinine 0.44 - 1.00 mg/dL 8.94  8.81  8.86   Sodium 135 - 145 mmol/L 139  140  139   Potassium 3.5 - 5.1 mmol/L 3.2  3.1  3.5   Chloride 98 - 111 mmol/L 100  98  100   CO2 22 - 32 mmol/L 24  27  30    Calcium  8.9 - 10.3 mg/dL 9.9  9.8  9.7   Total Protein 6.5 - 8.1 g/dL 7.5  7.7  7.6   Total Bilirubin 0.0 - 1.2 mg/dL 0.5  0.6  0.7   Alkaline Phos 38 - 126 U/L 58  49  50   AST 15 - 41 U/L 15  15  16    ALT 0 - 44 U/L 5  11  12     RADIOGRAPHIC STUDIES: I have personally reviewed the radiological images as  listed and agreed with the findings in the report. No results found.  ASSESSMENT & PLAN RADIAH LUBINSKI is a 78 y.o. female who presents to the clinic for a surveillance visit for history of medullary thyroid  carcinoma and right renal lesion.   #Medullary thyroid  carcinoma: --See oncology history as above. S/P total thyroidectomy currently on surveillance.  --Labs from 06/06/2024 reviewed with patient. WBC 5.9, Hgb 12.5, Plt 340, creatinine overall stable at 1.05, LFTs normal. CEA levels are normal but Calcitonin levels have trended up over the past few months.  --Will obtain CT neck to further evaluate  --RTC one week after CT neck to review results.   #Right renal lesion under surveillance --Right renal lesion managed conservatively with periodic imaging. --Continue surveillance with periodic imaging as directed by Dr. Sherrilee.  #Hypothyroidism: --Secondary to thyroidectomy, under the care of Dr. Lenis (endocrinology).   #Hypokalemia:  --Current prescription is potassium chloride  20 mEq BID but patient admits that she does not take medication consistently.  --Potassium level on 06/06/2024 WAS 3.2.  --Advised to take potassium chloride  supplement as prescribed.   #Microcytosis: --Patient only takes OTC iron supplement once daily --Advised to take every other day with a source of vitamin C  #Appetite loss #Weight loss: --Advised to eat small, frequent meals and supplement with 1-2 protein shakes per day Orders Placed This Encounter  Procedures   CT SOFT TISSUE NECK W CONTRAST    Standing Status:   Future    Expected Date:   06/20/2024    Expiration Date:   06/13/2025    If  indicated for the ordered procedure, I authorize the administration of contrast media per Radiology protocol:   Yes    Does the patient have a contrast media/X-ray dye allergy?:   No    Preferred imaging location?:   [facility]    All questions were answered. The patient knows to call the clinic  with any problems, questions or concerns.  I have spent a total of 30 minutes minutes of face-to-face and non-face-to-face time, preparing to see the patient, performing a medically appropriate examination, counseling and educating the patient, ordering tests/procedures, documenting clinical information in the electronic health record, independently interpreting results and communicating results to the patient, and care coordination.   Johnston Police, PA-C Department of Hematology/Oncology [facility] "

## 2024-06-13 NOTE — Progress Notes (Signed)
 "                                                     06/13/2024, 4:57 PM  Endocrinology follow-up note   Subjective:    Patient ID: Theresa Buchanan, female    DOB: 1946-06-08, PCP Patient, No Pcp Per   Past Medical History:  Diagnosis Date   Family history of cancer    Hypertension    Thyroid  disease    Past Surgical History:  Procedure Laterality Date   ABDOMINAL HYSTERECTOMY     BREAST BIOPSY Left 05/18/2019   Intraductal papilloma   COLON SURGERY     THYROIDECTOMY N/A 03/26/2021   Procedure: TOTAL THYROIDECTOMY WITH LIMITED LYMPH NODE DISSECTION;  Surgeon: Eletha Boas, MD;  Location: [facility];  Service: General;  Laterality: N/A;   TUBAL LIGATION     Social History   Socioeconomic History   Marital status: Married    Spouse name: Not on file   Number of children: Not on file   Years of education: Not on file   Highest education level: Not on file  Occupational History   Not on file  Tobacco Use   Smoking status: Never   Smokeless tobacco: Never  Vaping Use   Vaping status: Never Used  Substance and Sexual Activity   Alcohol use: Never   Drug use: Never   Sexual activity: Not on file  Other Topics Concern   Not on file  Social History Narrative   Not on file   Social Drivers of Health   Tobacco Use: Low Risk (06/13/2024)   Patient History    Smoking Tobacco Use: Never    Smokeless Tobacco Use: Never    Passive Exposure: Not on file  Financial Resource Strain: Not on file  Food Insecurity: Not on file  Transportation Needs: Not on file  Physical Activity: Not on file  Stress: Not on file  Social Connections: Not on file  Depression (PHQ2-9): Low Risk (06/13/2024)   Depression (PHQ2-9)    PHQ-2 Score: 0  Alcohol Screen: Not on file  Housing: Not on file  Utilities: Not on file  Health Literacy: Not on file   Family History  Problem Relation Age of Onset   Hypertension Mother    Thyroid  disease Mother    Diabetes Mother    Cancer Maternal  Aunt        unk types   Cancer Maternal Uncle        unk types   Outpatient Encounter Medications as of 06/13/2024  Medication Sig   aspirin EC 81 MG tablet Take 81 mg by mouth 2 (two) times a week.   calcium  carbonate (TUMS) 500 MG chewable tablet Chew 2 tablets (400 mg of elemental calcium  total) by mouth 2 (two) times daily.   Camphor-Menthol-Methyl Sal (SALONPAS) 3.06-21-08 % PTCH Place 1 application onto the skin daily as needed (pain).   levothyroxine  (SYNTHROID ) 125 MCG tablet TAKE 1/2 (ONE-HALF) TABLET BY MOUTH BEFORE BREAKFAST   liothyronine  (CYTOMEL ) 5 MCG tablet Take 1 tablet (5 mcg total) by mouth daily.   lisinopril -hydrochlorothiazide  (ZESTORETIC ) 20-25 MG tablet Take 1 tablet by mouth daily.   Polyethyl Glycol-Propyl Glycol (SYSTANE OP) Place 1 drop into both eyes 2 (two) times daily as needed (dry eyes).   potassium chloride  SA (KLOR-CON  M) 20 MEQ  tablet Take 1 tablet (20 mEq total) by mouth 2 (two) times daily.   traMADol  (ULTRAM ) 50 MG tablet Take 50 mg by mouth every 6 (six) hours as needed.   trolamine salicylate (ASPERCREME) 10 % cream Apply 1 application topically as needed for muscle pain.   No facility-administered encounter medications on file as of 06/13/2024.   ALLERGIES: No Known Allergies  VACCINATION STATUS:  There is no immunization history on file for this patient.  HPI Theresa Buchanan is 78 y.o. female who presents today with a medical history as above. she follows in this clinic for RAI induced hypothyroidism.  Currently on levothyroxine  62.5 mcg p.o. daily before breakfast and Cytomel  5 mcg p.o. daily.  Her previsit labs are showing better thyroid  function profile, still discordant with TSH above target and free T4 on target.  She does not have acute complaints today.  She presents with about 9 pounds of weight loss since last visit.  She denies palpitations, tremors, nor heat intolerance.  She denies dysphagia, dysphonia nor shortness of  breath.  -----------------------------------------------  -Her medical history is complicated.  She has medical history of hypothyroidism from radioactive iodine thyroid  ablation.  She was later found to have  multinodular goiter recently, in February 2022. Fine-needle aspiration in June 2022 showed thyroid  malignancy. She was sent for total thyroidectomy which she underwent on March 26, 2021.  She has recovered from her surgery.  Her surgical sample showed medullary thyroid  cancer measuring 3.1 cm, on the right lobe. Metastatic medullary thyroid  carcinoma was found in 1 lymph node on the right paratracheal groove, and 1 out of 4 positive lymph node in the central compartment. She followed  with her oncologist Dr. Rogers.  She has gone through staging imaging, genetic testing.  Her genetic testing did not show any pathological mutations-hereditary cause was not determined.  Calcitonin continues to be elevated, currently at 110, has been as high as 136 in October 2022.  Her CEA is down to 4.6, has been as high as 62.9 in October 2022.  She was evaluated by ENT surgeon with repeat CT scan.  She was not found to have identifiable surgically amenable lesion.  She has left thoracic wall lesion which was thought to be cyst. She has not received any adjuvant chemotherapy agent yet. Her CT scan showed some lesions in the right hepatic lobe, small lesions in around the right kidney, pulmonary lesions said to be hematoma, 10 mm rounded lesion along the anterior chest wall below the thyroid .  Her latest malignancy markers on June 06, 2024 include calcitonin at elevated 114 and normal CEA 4.2.  She is recently evaluated by Dr. Davonna.  She denies family history of thyroid  malignancy, however reports various forms of thyroid  dysfunction in her family members including her mother and sisters.    Presurgical chest x-ray showed 3.3 cm mass which was later said to be hamartoma.    CT scan however showed  questionable renal lesion, unspecified liver lesions which are not seen on the MRI.    Review of Systems  Constitutional: + Steady weight,  no fatigue, no subjective hyperthermia, no subjective hypothermia   Objective:       06/13/2024    2:21 PM 06/13/2024   10:55 AM 04/11/2024    1:17 PM  Vitals with BMI  Height  5' 6   Weight 143 lbs 144 lbs   BMI 23.09 23.25   Systolic 105 100 851  Diastolic 52 67 74  Pulse 97  88 87    BP 100/67   Pulse 88   Resp 18   Ht 5' 6 (1.676 m)   Wt 144 lb (65.3 kg)   SpO2 99%   BMI 23.24 kg/m   Wt Readings from Last 3 Encounters:  06/13/24 143 lb (64.9 kg)  06/13/24 144 lb (65.3 kg)  03/09/24 149 lb 3.2 oz (67.7 kg)    Physical Exam  Constitutional:  Body mass index is 23.24 kg/m.,  not in acute distress, normal state of mind Eyes: PERRLA, EOMI, no exophthalmos ENT: moist mucous membranes, + post thyroidectomy scar, no gross cervical lymphadenopathy   Thyroid  ultrasound August 06, 2020:  Right lobe 3.1 cm with 2.1 suspicious nodule. Left lobe 3.4 cm with 1.2 cm nonsuspicious nodule.  Fine-needle aspiration on December 12, 2020 of right-sided thyroid  nodule Clinical History: 2.1 cm RML  Specimen Submitted:  A. THYROID , RIGHT, FINE NEEDLE ASPIRATION:   FINAL MICROSCOPIC DIAGNOSIS:  - Malignant cells present (Bethesda category VI)   SPECIMEN ADEQUACY:  Satisfactory for evaluation    Surgical sample from March 26, 2021 FINAL MICROSCOPIC DIAGNOSIS:   A. THYROID , TOTAL THYROIDECTOMY:  - Medullary thyroid  carcinoma, 3.1 cm.  - Tumor confined within the thyroid  capsule.  - Margins not involved.  - One perithyroidal lymph node negative for metastatic carcinoma (0/1).  - See oncology table.   B. LYMPH NODE, RIGHT PARATRACHEAL, EXCISION:  - Metastatic medullary carcinoma in one lymph node (1/1).   C. LYMPH NODE, CENTRAL COMPARTMENT, EXCISION:  - Metastatic medullary carcinoma in one of four lymph nodes (1/4).    April 26, 2021 MRI abdomen with and without contrast:  IMPRESSION: 1. Lower pole right renal lesion is favored to represent a cystic renal cell carcinoma. Bosniak IV (2019). An adjacent area of more central precontrast T1 hyperintensity may represent a separate hemorrhagic cyst or less likely be part of the exophytic lesion. 2. Central upper pole right renal lesion is favored to represent a complex renal sinus cyst but is indeterminate. Presuming the patient does not undergo complete nephrectomy, recommend follow-up pre and post contrast abdominal MRI at 6 months. 3. No suspicious liver lesion. 4. No evidence of abdominal metastatic disease. 5. Right lower lobe hamartoma. 6.  Aortic Atherosclerosis (ICD10-I70.0).     Assessment & Plan:   1.  Hypothyroidism following radioiodine therapy 2.  Medullary thyroid  carcinoma   For her postsurgical hypothyroidism, her previsit thyroid  function tests are still discordant with free T4 on target at 1.27 and TSH lagging at 54.3.   In light of her presentation with unintended weight loss, she would not tolerate a higher dose of levothyroxine .    She will be kept on the same dose of thyroid  hormone replacement involving  levothyroxine  62.5 mcg p.o. daily before breakfast (half tablet of 125 mcg), and low-dose Cytomel  5 mcg p.o. daily at breakfast.   - We discussed about the correct intake of her thyroid  hormone, on empty stomach at fasting, with water, separated by at least 30 minutes from breakfast and other medications,  and separated by more than 4 hours from calcium , iron, multivitamins, acid reflux medications (PPIs). -Patient is made aware of the fact that thyroid  hormone replacement is needed for life, dose to be adjusted by periodic monitoring of thyroid  function tests.   -There is no utility of suppressive therapy nor radioactive iodine therapy in her case. - I have encouraged her to keep her appointments for the CT scan and  oncology appointment coming up.  -  Her surgical cytology revealed locally invasive, possibly distant metastatic medullary thyroid  cancer-involving at least 2 lymph nodes in the neck.  She is status post total thyroidectomy with limited neck dissection.  She was subsequently worked up for staging imaging as well as genetic testing.  Genetic test is not showing hereditary cause.    She was previously seen by ENT surgeon Dr. Nonda Loader. CT neck/thorax did not reveal an identifiable surgical lesion.  Left thoracic wall lesion was described as possible epidermal inclusion  cyst.   She has not undergone any additional surgery chemotherapy yet.  Her most recent calcitonin remains high at 114 on June 06, 2024, CEA normal at 4.2.   She is just seen by Dr. Davonna.  Imaging with CT and MRI showed more lesions in the kidneys, currently undergoing evaluation by urology.   I have advised her to maintain her low-dose potassium supplement with the 20 mEq p.o. twice daily. She is advised to maintain close follow-up with all of her providers including oncology and urology.   - she is advised to maintain close follow up with Patient, No Pcp Per for primary care needs.   I spent  20  minutes in the care of the patient today including review of labs from Thyroid  Function, CMP, and other relevant labs ; imaging/biopsy records (current and previous including abstractions from other facilities); face-to-face time discussing  her lab results and symptoms, medications doses, her options of short and long term treatment based on the latest standards of care / guidelines;   and documenting the encounter.  Theresa Buchanan  participated in the discussions, expressed understanding, and voiced agreement with the above plans.  All questions were answered to her satisfaction. she is encouraged to contact clinic should she have any questions or concerns prior to her return visit.   Follow up plan: Return in about 6 months  (around 12/12/2024) for F/U with Pre-visit Labs.   Ranny Earl, MD [facility] [address] [place], [place] [address] Phone: [phone]  Fax: [phone]     06/13/2024, 4:57 PM  This note was partially dictated with voice recognition software. Similar sounding words can be transcribed inadequately or may not  be corrected upon review.   "

## 2024-07-01 ENCOUNTER — Ambulatory Visit (HOSPITAL_COMMUNITY)
Admission: RE | Admit: 2024-07-01 | Discharge: 2024-07-01 | Disposition: A | Discharge status: Home / Self Care | Source: Ambulatory Visit | Attending: Physician Assistant | Admitting: Physician Assistant

## 2024-07-01 DIAGNOSIS — C73 Malignant neoplasm of thyroid gland: Secondary | ICD-10-CM | POA: Diagnosis present

## 2024-07-01 MED ORDER — IOHEXOL 300 MG/ML  SOLN
75.0000 mL | Freq: Once | INTRAMUSCULAR | Status: AC | PRN
  Administered 2024-07-01: 75 mL via INTRAVENOUS

## 2024-07-11 ENCOUNTER — Inpatient Hospital Stay: Admitting: Oncology

## 2024-07-19 ENCOUNTER — Inpatient Hospital Stay: Admitting: Oncology

## 2024-07-19 VITALS — BP 116/67 | HR 76 | Temp 99.4°F | Resp 18 | Ht 65.0 in | Wt 149.2 lb

## 2024-07-19 DIAGNOSIS — C73 Malignant neoplasm of thyroid gland: Secondary | ICD-10-CM | POA: Diagnosis not present

## 2024-07-19 DIAGNOSIS — N289 Disorder of kidney and ureter, unspecified: Secondary | ICD-10-CM

## 2024-07-19 DIAGNOSIS — E039 Hypothyroidism, unspecified: Secondary | ICD-10-CM

## 2024-07-19 NOTE — Patient Instructions (Addendum)
 [facility] at [facility] Discharge Instructions   You were seen and examined today by Dr. Davonna.  She reviewed the results of your lab work which are normal/stable.   She reviewed the results of your CT scan which did not show any evidence of cancer.   We will see you back in 6 months. We will repeat lab work prior to this visit.    Return as scheduled.    Thank you for choosing [facility] to provide your oncology and hematology care.  To afford each patient quality time with our provider, please arrive at least 15 minutes before your scheduled appointment time.   If you have a lab appointment with the Cancer Center please come in thru the Main Entrance and check in at the main information desk.  You need to re-schedule your appointment should you arrive 10 or more minutes late.  We strive to give you quality time with our providers, and arriving late affects you and other patients whose appointments are after yours.  Also, if you no show three or more times for appointments you may be dismissed from the clinic at the providers discretion.     Again, thank you for choosing [facility].  Our hope is that these requests will decrease the amount of time that you wait before being seen by our physicians.       _____________________________________________________________  Should you have questions after your visit to [facility], please contact our office at [phone] and follow the prompts.  Our office hours are 8:00 a.m. and 4:30 p.m. Monday - Friday.  Please note that voicemails left after 4:00 p.m. may not be returned until the following business day.  We are closed weekends and major holidays.  You do have access to a nurse 24-7, just call the main number to the clinic [phone] and do not press any options, hold on the line and a nurse will answer the phone.    For prescription refill  requests, have your pharmacy contact our office and allow 72 hours.    Due to Covid, you will need to wear a mask upon entering the hospital. If you do not have a mask, a mask will be given to you at the Main Entrance upon arrival. For doctor visits, patients may have 1 support person age 28 or older with them. For treatment visits, patients can not have anyone with them due to social distancing guidelines and our immunocompromised population.

## 2024-10-12 ENCOUNTER — Ambulatory Visit: Admitting: Urology

## 2024-11-14 ENCOUNTER — Ambulatory Visit: Payer: Self-pay

## 2024-12-12 ENCOUNTER — Ambulatory Visit: Admitting: "Endocrinology

## 2025-01-17 ENCOUNTER — Inpatient Hospital Stay

## 2025-01-24 ENCOUNTER — Inpatient Hospital Stay: Admitting: Oncology
